# Patient Record
Sex: Male | Born: 1940
Health system: Southern US, Community
[De-identification: ages and names within clinical notes are randomized; demographics above are authoritative.]

## PROBLEM LIST (undated history)

## (undated) DIAGNOSIS — E785 Hyperlipidemia, unspecified: Secondary | ICD-10-CM

## (undated) DIAGNOSIS — M1990 Unspecified osteoarthritis, unspecified site: Secondary | ICD-10-CM

## (undated) DIAGNOSIS — I10 Essential (primary) hypertension: Secondary | ICD-10-CM

## (undated) DIAGNOSIS — N400 Enlarged prostate without lower urinary tract symptoms: Secondary | ICD-10-CM

## (undated) DIAGNOSIS — C4491 Basal cell carcinoma of skin, unspecified: Secondary | ICD-10-CM

## (undated) DIAGNOSIS — L570 Actinic keratosis: Secondary | ICD-10-CM

## (undated) HISTORY — PX: JOINT REPLACEMENT: SHX530

## (undated) HISTORY — PX: PROSTATE SURGERY: SHX751

## (undated) HISTORY — DX: Hyperlipidemia, unspecified: E78.5

## (undated) HISTORY — DX: Essential (primary) hypertension: I10

## (undated) HISTORY — DX: Basal cell carcinoma of skin, unspecified: C44.91

## (undated) HISTORY — DX: Benign prostatic hyperplasia without lower urinary tract symptoms: N40.0

## (undated) HISTORY — DX: Actinic keratosis: L57.0

---

## 2003-02-12 ENCOUNTER — Encounter: Admission: RE | Admit: 2003-02-12 | Discharge: 2003-02-12 | Payer: Self-pay | Admitting: Neurosurgery

## 2003-03-05 ENCOUNTER — Encounter: Admission: RE | Admit: 2003-03-05 | Discharge: 2003-03-05 | Payer: Self-pay | Admitting: Neurosurgery

## 2003-06-25 ENCOUNTER — Encounter: Admission: RE | Admit: 2003-06-25 | Discharge: 2003-06-25 | Payer: Self-pay | Admitting: Neurosurgery

## 2003-11-24 ENCOUNTER — Encounter: Admission: RE | Admit: 2003-11-24 | Discharge: 2003-11-24 | Payer: Self-pay | Admitting: Neurosurgery

## 2004-01-06 ENCOUNTER — Encounter: Admission: RE | Admit: 2004-01-06 | Discharge: 2004-01-06 | Payer: Self-pay | Admitting: Neurosurgery

## 2004-05-09 ENCOUNTER — Encounter: Admission: RE | Admit: 2004-05-09 | Discharge: 2004-05-09 | Payer: Self-pay | Admitting: Neurosurgery

## 2004-05-22 ENCOUNTER — Encounter: Admission: RE | Admit: 2004-05-22 | Discharge: 2004-05-22 | Payer: Self-pay | Admitting: Neurosurgery

## 2007-01-29 ENCOUNTER — Ambulatory Visit: Payer: Self-pay | Admitting: Gastroenterology

## 2013-04-25 ENCOUNTER — Emergency Department: Payer: Self-pay | Admitting: Internal Medicine

## 2013-04-25 LAB — COMPREHENSIVE METABOLIC PANEL
Albumin: 3.4 g/dL (ref 3.4–5.0)
Alkaline Phosphatase: 53 U/L
Anion Gap: 6 — ABNORMAL LOW (ref 7–16)
BUN: 24 mg/dL — ABNORMAL HIGH (ref 7–18)
Bilirubin,Total: 0.6 mg/dL (ref 0.2–1.0)
Calcium, Total: 8.6 mg/dL (ref 8.5–10.1)
Chloride: 106 mmol/L (ref 98–107)
Co2: 25 mmol/L (ref 21–32)
Creatinine: 0.99 mg/dL (ref 0.60–1.30)
EGFR (African American): >60
EGFR (Non-African Amer.): >60
Glucose: 106 mg/dL — ABNORMAL HIGH (ref 65–99)
Osmolality: 278 (ref 275–301)
Potassium: 4.5 mmol/L (ref 3.5–5.1)
SGOT(AST): 55 U/L — ABNORMAL HIGH (ref 15–37)
SGPT (ALT): 46 U/L (ref 12–78)
Sodium: 137 mmol/L (ref 136–145)
Total Protein: 7 g/dL (ref 6.4–8.2)

## 2013-04-25 LAB — URINALYSIS, COMPLETE
Bacteria: NONE SEEN
Bilirubin,UR: NEGATIVE
Blood: NEGATIVE
Glucose,UR: NEGATIVE mg/dL (ref 0–75)
Ketone: NEGATIVE
Leukocyte Esterase: NEGATIVE
Nitrite: NEGATIVE
Ph: 7 (ref 4.5–8.0)
Protein: NEGATIVE
RBC,UR: <1 /HPF (ref 0–5)
Specific Gravity: 1.02 (ref 1.003–1.030)
Squamous Epithelial: <1
WBC UR: 1 /HPF (ref 0–5)

## 2013-04-25 LAB — CBC WITH DIFFERENTIAL/PLATELET
Basophil #: 0.1 10*3/uL (ref 0.0–0.1)
Basophil %: 0.8 %
Eosinophil #: 0.2 10*3/uL (ref 0.0–0.7)
Eosinophil %: 3.2 %
HCT: 46.8 % (ref 40.0–52.0)
HGB: 15.1 g/dL (ref 13.0–18.0)
Lymphocyte #: 1.2 10*3/uL (ref 1.0–3.6)
Lymphocyte %: 19 %
MCH: 29.7 pg (ref 26.0–34.0)
MCHC: 32.2 g/dL (ref 32.0–36.0)
MCV: 92 fL (ref 80–100)
Monocyte #: 0.6 x10 3/mm (ref 0.2–1.0)
Monocyte %: 8.9 %
Neutrophil #: 4.4 10*3/uL (ref 1.4–6.5)
Neutrophil %: 68.1 %
Platelet: 157 10*3/uL (ref 150–440)
RBC: 5.07 10*6/uL (ref 4.40–5.90)
RDW: 14.1 % (ref 11.5–14.5)
WBC: 6.5 10*3/uL (ref 3.8–10.6)

## 2013-04-25 LAB — TROPONIN I: Troponin-I: <0.02 ng/mL

## 2013-04-25 LAB — LIPASE, BLOOD: Lipase: 258 U/L (ref 73–393)

## 2013-04-27 ENCOUNTER — Emergency Department: Payer: Self-pay | Admitting: Emergency Medicine

## 2013-05-27 DIAGNOSIS — M549 Dorsalgia, unspecified: Secondary | ICD-10-CM | POA: Insufficient documentation

## 2013-05-27 DIAGNOSIS — R109 Unspecified abdominal pain: Secondary | ICD-10-CM | POA: Insufficient documentation

## 2013-05-27 DIAGNOSIS — B029 Zoster without complications: Secondary | ICD-10-CM | POA: Insufficient documentation

## 2013-06-02 ENCOUNTER — Ambulatory Visit: Payer: Self-pay | Admitting: Pain Medicine

## 2013-06-10 ENCOUNTER — Ambulatory Visit: Payer: Self-pay | Admitting: Pain Medicine

## 2013-06-22 ENCOUNTER — Ambulatory Visit: Payer: Self-pay | Admitting: Pain Medicine

## 2013-06-30 ENCOUNTER — Ambulatory Visit: Payer: Self-pay | Admitting: Pain Medicine

## 2013-07-08 ENCOUNTER — Ambulatory Visit: Payer: Self-pay | Admitting: Pain Medicine

## 2013-07-23 ENCOUNTER — Ambulatory Visit: Payer: Self-pay | Admitting: Pain Medicine

## 2013-08-05 ENCOUNTER — Ambulatory Visit (INDEPENDENT_AMBULATORY_CARE_PROVIDER_SITE_OTHER): Payer: Medicare Other | Admitting: Podiatry

## 2013-08-05 ENCOUNTER — Encounter: Payer: Self-pay | Admitting: Podiatry

## 2013-08-05 ENCOUNTER — Ambulatory Visit (INDEPENDENT_AMBULATORY_CARE_PROVIDER_SITE_OTHER): Payer: Medicare Other

## 2013-08-05 VITALS — Resp 16 | Ht 69.0 in | Wt 159.0 lb

## 2013-08-05 DIAGNOSIS — M205X9 Other deformities of toe(s) (acquired), unspecified foot: Secondary | ICD-10-CM

## 2013-08-05 DIAGNOSIS — M205X2 Other deformities of toe(s) (acquired), left foot: Secondary | ICD-10-CM

## 2013-08-05 DIAGNOSIS — M21619 Bunion of unspecified foot: Secondary | ICD-10-CM

## 2013-08-05 DIAGNOSIS — M21612 Bunion of left foot: Secondary | ICD-10-CM

## 2013-08-05 DIAGNOSIS — L608 Other nail disorders: Secondary | ICD-10-CM

## 2013-08-05 NOTE — Progress Notes (Signed)
   Subjective:    Patient ID: Timothy Knapp, male    DOB: [birth year], 73 y.o.   MRN: [id number]  HPI Comments: im concerned with my big toe and my 2nd toe on my left foot. They do not hurt. My big toe has been going under the 2nd toe for about 6 months. The pad under the toes hurt. Standing and walking will bother my foot slightly. i dont do anything for my foot.  Foot Pain      Review of Systems  Genitourinary: Positive for frequency.  Musculoskeletal:       Back pain  Skin:       Open sores Change in nails  All other systems reviewed and are negative.      Objective:   Physical Exam: I have reviewed his past medical history medications allergies surgeries social history and review of systems. Pulses are strongly palpable bilateral. Neurologic sensorium is intact per Semmes-Weinstein monofilament. Deep tendon reflexes are intact bilateral muscle strength is 5 over 5 dorsiflexors plantar flexors inverters everters all intrinsic musculature is intact. Orthopedic evaluation demonstrates all joints distal to the ankle a full range of motion with the exception of the first metatarsophalangeal joint of the left foot. There is limitation on dorsiflexion and demonstrates an obvious hallux abductovalgus deformity. The second digit of the left foot is been rubbed by the hallux left. Radiographic evaluation demonstrates dorsal spurring the head of the first metatarsal left. And joint space narrowing severe in nature resulting in hallux rigidus. Cutaneous evaluation demonstrates supple well hydrated cutis with exception of tinea pedis to the plantar aspect of the bilateral foot nails appear to be thick yellow dystrophic possibly mycotic we'll need to rule out.        Assessment & Plan:  Assessment: Hallux limitus with osteoarthritis and bunion deformity first metatarsophalangeal joint left foot. Irritation of the second toe due to the abnormality of the first. Onychodystrophy hallux bilaterally  with tinea pedis.  Plan: Discussed appropriate shoe gear stretching exercises ice therapy and shoe gear modifications area discussed surgical intervention with silicone implant. Took samples of his hallux nails today and sent for pathology evaluation. Discussed padding and dispensed padding to go between the toes. I will followup with him with his pathology returns.

## 2013-08-19 ENCOUNTER — Encounter: Payer: Self-pay | Admitting: Podiatry

## 2013-08-24 ENCOUNTER — Ambulatory Visit (INDEPENDENT_AMBULATORY_CARE_PROVIDER_SITE_OTHER): Payer: Medicare Other | Admitting: Podiatry

## 2013-08-24 VITALS — BP 146/75 | HR 68 | Resp 16

## 2013-08-24 DIAGNOSIS — Z79899 Other long term (current) drug therapy: Secondary | ICD-10-CM

## 2013-08-24 LAB — CBC WITH DIFFERENTIAL/PLATELET
Basophils Absolute: 0 10*3/uL (ref 0.0–0.2)
Basos: 1 %
Eos: 6 %
Eosinophils Absolute: 0.3 10*3/uL (ref 0.0–0.4)
HCT: 41.4 % (ref 37.5–51.0)
Hemoglobin: 14.5 g/dL (ref 12.6–17.7)
Immature Grans (Abs): 0 10*3/uL (ref 0.0–0.1)
Immature Granulocytes: 0 %
Lymphocytes Absolute: 1.2 10*3/uL (ref 0.7–3.1)
Lymphs: 20 %
MCH: 31.5 pg (ref 26.6–33.0)
MCHC: 35 g/dL (ref 31.5–35.7)
MCV: 90 fL (ref 79–97)
Monocytes Absolute: 0.6 10*3/uL (ref 0.1–0.9)
Monocytes: 10 %
Neutrophils Absolute: 3.8 10*3/uL (ref 1.4–7.0)
Neutrophils Relative %: 63 %
RBC: 4.6 x10E6/uL (ref 4.14–5.80)
RDW: 14.3 % (ref 12.3–15.4)
WBC: 5.8 10*3/uL (ref 3.4–10.8)

## 2013-08-24 MED ORDER — TERBINAFINE HCL 250 MG PO TABS: 250.0000 mg | ORAL_TABLET | Freq: Every day | ORAL | Status: DC

## 2013-08-24 NOTE — Progress Notes (Signed)
He presents today for his fungal results.  Objective: Onychomycosis.  Assessment: Onychomycosis.  Plan: Started him on Lamisil 250 mg tablets 1 by mouth daily x30 a blood work will also be performed. I will followup with him in one month for 90 day supply of medication and another liver profile.

## 2013-08-25 ENCOUNTER — Ambulatory Visit: Payer: Self-pay | Admitting: Pain Medicine

## 2013-08-25 LAB — HEPATIC FUNCTION PANEL
ALT: 53 IU/L — ABNORMAL HIGH (ref 0–44)
AST: 54 IU/L — ABNORMAL HIGH (ref 0–40)
Albumin: 3.9 g/dL (ref 3.5–4.8)
Alkaline Phosphatase: 60 IU/L (ref 39–117)
Bilirubin, Direct: 0.08 mg/dL (ref 0.00–0.40)
Total Bilirubin: 0.3 mg/dL (ref 0.0–1.2)
Total Protein: 5.9 g/dL — ABNORMAL LOW (ref 6.0–8.5)

## 2013-08-26 ENCOUNTER — Telehealth: Payer: Self-pay | Admitting: *Deleted

## 2013-08-26 NOTE — Telephone Encounter (Signed)
Spoke to patient regarding blood work , patient understood , pt will be seen in one month

## 2013-09-21 ENCOUNTER — Ambulatory Visit: Payer: Medicare Other | Admitting: Podiatry

## 2013-10-15 ENCOUNTER — Ambulatory Visit: Payer: Self-pay | Admitting: Pain Medicine

## 2013-10-26 ENCOUNTER — Ambulatory Visit: Payer: Self-pay | Admitting: Pain Medicine

## 2013-11-12 ENCOUNTER — Ambulatory Visit: Payer: Self-pay | Admitting: Pain Medicine

## 2013-12-15 ENCOUNTER — Ambulatory Visit: Payer: Self-pay | Admitting: Pain Medicine

## 2013-12-23 ENCOUNTER — Ambulatory Visit: Payer: Self-pay | Admitting: Pain Medicine

## 2014-01-12 ENCOUNTER — Ambulatory Visit: Payer: Self-pay | Admitting: Pain Medicine

## 2014-01-12 DIAGNOSIS — F1120 Opioid dependence, uncomplicated: Secondary | ICD-10-CM | POA: Diagnosis not present

## 2014-01-12 DIAGNOSIS — G894 Chronic pain syndrome: Secondary | ICD-10-CM | POA: Diagnosis not present

## 2014-01-12 DIAGNOSIS — M791 Myalgia: Secondary | ICD-10-CM | POA: Diagnosis not present

## 2014-01-12 DIAGNOSIS — Z79899 Other long term (current) drug therapy: Secondary | ICD-10-CM | POA: Diagnosis not present

## 2014-01-12 DIAGNOSIS — R0782 Intercostal pain: Secondary | ICD-10-CM | POA: Diagnosis not present

## 2014-01-12 DIAGNOSIS — Z87898 Personal history of other specified conditions: Secondary | ICD-10-CM | POA: Diagnosis not present

## 2014-01-12 DIAGNOSIS — M546 Pain in thoracic spine: Secondary | ICD-10-CM | POA: Diagnosis not present

## 2014-01-12 DIAGNOSIS — Z79891 Long term (current) use of opiate analgesic: Secondary | ICD-10-CM | POA: Diagnosis not present

## 2014-01-12 DIAGNOSIS — B0223 Postherpetic polyneuropathy: Secondary | ICD-10-CM | POA: Diagnosis not present

## 2014-01-20 ENCOUNTER — Ambulatory Visit: Payer: Self-pay | Admitting: Pain Medicine

## 2014-01-20 DIAGNOSIS — M546 Pain in thoracic spine: Secondary | ICD-10-CM | POA: Diagnosis not present

## 2014-01-20 DIAGNOSIS — R0782 Intercostal pain: Secondary | ICD-10-CM | POA: Diagnosis not present

## 2014-02-09 ENCOUNTER — Ambulatory Visit: Payer: Self-pay | Admitting: Pain Medicine

## 2014-02-09 DIAGNOSIS — M791 Myalgia: Secondary | ICD-10-CM | POA: Diagnosis not present

## 2014-02-09 DIAGNOSIS — R0782 Intercostal pain: Secondary | ICD-10-CM | POA: Diagnosis not present

## 2014-02-09 DIAGNOSIS — B0223 Postherpetic polyneuropathy: Secondary | ICD-10-CM | POA: Diagnosis not present

## 2014-02-09 DIAGNOSIS — M545 Low back pain: Secondary | ICD-10-CM | POA: Diagnosis not present

## 2014-02-09 DIAGNOSIS — E780 Pure hypercholesterolemia: Secondary | ICD-10-CM | POA: Diagnosis not present

## 2014-02-09 DIAGNOSIS — I10 Essential (primary) hypertension: Secondary | ICD-10-CM | POA: Diagnosis not present

## 2014-03-01 ENCOUNTER — Ambulatory Visit: Payer: Self-pay | Admitting: Pain Medicine

## 2014-03-01 DIAGNOSIS — R0782 Intercostal pain: Secondary | ICD-10-CM | POA: Diagnosis not present

## 2014-03-01 DIAGNOSIS — M546 Pain in thoracic spine: Secondary | ICD-10-CM | POA: Diagnosis not present

## 2014-03-25 DIAGNOSIS — B0229 Other postherpetic nervous system involvement: Secondary | ICD-10-CM | POA: Diagnosis not present

## 2014-03-25 DIAGNOSIS — I10 Essential (primary) hypertension: Secondary | ICD-10-CM | POA: Diagnosis not present

## 2014-03-25 DIAGNOSIS — E780 Pure hypercholesterolemia: Secondary | ICD-10-CM | POA: Diagnosis not present

## 2014-04-06 ENCOUNTER — Ambulatory Visit: Payer: Self-pay | Admitting: Pain Medicine

## 2014-04-06 DIAGNOSIS — B0223 Postherpetic polyneuropathy: Secondary | ICD-10-CM | POA: Diagnosis not present

## 2014-04-06 DIAGNOSIS — M5416 Radiculopathy, lumbar region: Secondary | ICD-10-CM | POA: Diagnosis not present

## 2014-04-06 DIAGNOSIS — M546 Pain in thoracic spine: Secondary | ICD-10-CM | POA: Diagnosis not present

## 2014-04-06 DIAGNOSIS — R0782 Intercostal pain: Secondary | ICD-10-CM | POA: Diagnosis not present

## 2014-04-06 DIAGNOSIS — M47817 Spondylosis without myelopathy or radiculopathy, lumbosacral region: Secondary | ICD-10-CM | POA: Diagnosis not present

## 2014-05-06 ENCOUNTER — Ambulatory Visit
Admit: 2014-05-06 | Disposition: A | Discharge status: Home / Self Care | Payer: Self-pay | Attending: Pain Medicine | Admitting: Pain Medicine

## 2014-05-06 DIAGNOSIS — M47817 Spondylosis without myelopathy or radiculopathy, lumbosacral region: Secondary | ICD-10-CM | POA: Diagnosis not present

## 2014-05-06 DIAGNOSIS — G8929 Other chronic pain: Secondary | ICD-10-CM | POA: Diagnosis not present

## 2014-05-06 DIAGNOSIS — F1120 Opioid dependence, uncomplicated: Secondary | ICD-10-CM | POA: Diagnosis not present

## 2014-05-06 DIAGNOSIS — R0782 Intercostal pain: Secondary | ICD-10-CM | POA: Diagnosis not present

## 2014-05-06 DIAGNOSIS — Z79899 Other long term (current) drug therapy: Secondary | ICD-10-CM | POA: Diagnosis not present

## 2014-05-06 DIAGNOSIS — Z5181 Encounter for therapeutic drug level monitoring: Secondary | ICD-10-CM | POA: Diagnosis not present

## 2014-05-06 DIAGNOSIS — B0223 Postherpetic polyneuropathy: Secondary | ICD-10-CM | POA: Diagnosis not present

## 2014-05-06 DIAGNOSIS — M545 Low back pain: Secondary | ICD-10-CM | POA: Diagnosis not present

## 2014-05-06 DIAGNOSIS — Z0389 Encounter for observation for other suspected diseases and conditions ruled out: Secondary | ICD-10-CM | POA: Diagnosis not present

## 2014-05-06 DIAGNOSIS — M5416 Radiculopathy, lumbar region: Secondary | ICD-10-CM | POA: Diagnosis not present

## 2014-05-06 DIAGNOSIS — E780 Pure hypercholesterolemia: Secondary | ICD-10-CM | POA: Diagnosis not present

## 2014-05-06 DIAGNOSIS — I10 Essential (primary) hypertension: Secondary | ICD-10-CM | POA: Diagnosis not present

## 2014-06-03 ENCOUNTER — Encounter (INDEPENDENT_AMBULATORY_CARE_PROVIDER_SITE_OTHER): Payer: Self-pay

## 2014-06-03 ENCOUNTER — Encounter: Payer: Self-pay | Admitting: Pain Medicine

## 2014-06-03 ENCOUNTER — Ambulatory Visit: Payer: Medicare Other | Attending: Pain Medicine | Admitting: Pain Medicine

## 2014-06-03 VITALS — BP 115/59 | HR 58 | Temp 97.9°F | Resp 18 | Ht 69.0 in | Wt 155.0 lb

## 2014-06-03 DIAGNOSIS — M5134 Other intervertebral disc degeneration, thoracic region: Secondary | ICD-10-CM | POA: Insufficient documentation

## 2014-06-03 DIAGNOSIS — B0229 Other postherpetic nervous system involvement: Secondary | ICD-10-CM

## 2014-06-03 DIAGNOSIS — R0782 Intercostal pain: Secondary | ICD-10-CM | POA: Diagnosis not present

## 2014-06-03 DIAGNOSIS — M5416 Radiculopathy, lumbar region: Secondary | ICD-10-CM | POA: Diagnosis not present

## 2014-06-03 DIAGNOSIS — M47817 Spondylosis without myelopathy or radiculopathy, lumbosacral region: Secondary | ICD-10-CM | POA: Diagnosis not present

## 2014-06-03 DIAGNOSIS — B0223 Postherpetic polyneuropathy: Secondary | ICD-10-CM | POA: Diagnosis not present

## 2014-06-03 DIAGNOSIS — M546 Pain in thoracic spine: Secondary | ICD-10-CM | POA: Diagnosis not present

## 2014-06-03 MED ORDER — DULOXETINE HCL 30 MG PO CPEP: ORAL_CAPSULE | ORAL | Status: DC

## 2014-06-03 MED ORDER — OXYCODONE HCL 5 MG PO TABS: ORAL_TABLET | ORAL | Status: DC

## 2014-06-03 MED ORDER — GABAPENTIN 800 MG PO TABS: ORAL_TABLET | ORAL | Status: DC

## 2014-06-03 NOTE — Progress Notes (Signed)
   Subjective:    Patient ID: Timothy Knapp, male    DOB: 01/21/1940, 74 y.o.   MRN: [id number]  HPI   patient is 74 year old gentleman who [facility] and treatment of right thoracic pain felt to be due to postherpetic neuralgia. Patient states he has had excellent relief of pain with treatment and [facility] states that he knows some return of pain at this time. Discuss patient's medications and treatment and we'll modify the treatment regimen by increasing the Neurontin at this time provided patient is without drowsiness confusion or other undesirable side effects. We will also schedule patient for intercostal nerve block to be performed at time return appointment. All understanding and in agreement with suggested treatment plan  Review of Systems     Objective:   Physical Exam   was tenderness of the splenius capitis and occipitalis region of mild degree. There was unremarkable Spurling's maneuver. Patient appeared to be with bilaterally equal grip strength. Tinel and Phalen's maneuver without increased pain of any significant degree. Palpation of the cervical and thoracic facet and cervical and thoracic paraspinal musculature region was returns tends to palpation of mild degree in the cervical region with moderately severe tends to palpation of the right thoracic paraspinal musculature region in any area of approximately dermatomal distribution of T4  To  T8 . Patient of the lumbar paraspinal muscles lumbar facet reproducing minimal discomfort. Raising tolerated to 30 without increased pain dorsiflexion negative clonus negative Homans. The  Abdomen was with tenderness to palpation dermatomal distribution approximates T 4  To  T8  with no costovertebral tenderness noted      Assessment & Plan:     postherpetic neuralgia of the right thoracic region    Plan   Continue present medications. Neurontin Cymbalta vitamin B  And  oxycodone  F/U PCP for evaliation of  BP and general medical  Condition.   intercostal nerve block to be performed at time of return appointment  F/U surgical evaluation.  F/U neurological evaluation.  May consider radiofrequency rhizolysis or intraspinal procedures pending response to present treatment and F/U evaluation.  Patient to call [facility] should patient have concerns prior to scheduled return appointment.

## 2014-06-03 NOTE — Patient Instructions (Addendum)
Continue present medications. As discussed you may take one Neurontin at bedtime and one half of a Neurontin during the day only if tolerated. Continue Cymbalta and vitamin B as presently doing and titrate her oxycodone as we discussed  Intercostal nerve blocks on 06/21/2014  F/U PCP for evaliation of  BP and general medical  condition.  F/U surgical evaluation.  F/U neurological evaluation.  May consider radiofrequency rhizolysis or intraspinal procedures pending response to present treatment and F/U evaluation.  Patient to call [facility] should patient have concerns prior to scheduled return appointment. GENERAL RISKS AND COMPLICATIONS  What are the risk, side effects and possible complications? Generally speaking, most procedures are safe.  However, with any procedure there are risks, side effects, and the possibility of complications.  The risks and complications are dependent upon the sites that are lesioned, or the type of nerve block to be performed.  The closer the procedure is to the spine, the more serious the risks are.  Great care is taken when placing the radio frequency needles, block needles or lesioning probes, but sometimes complications can occur. 1. Infection: Any time there is an injection through the skin, there is a risk of infection.  This is why sterile conditions are used for these blocks.  There are four possible types of infection. 1. Localized skin infection. 2. Central Nervous System Infection-This can be in the form of Meningitis, which can be deadly. 3. Epidural Infections-This can be in the form of an epidural abscess, which can cause pressure inside of the spine, causing compression of the spinal cord with subsequent paralysis. This would require an emergency surgery to decompress, and there are no guarantees that the patient would recover from the paralysis. 4. Discitis-This is an infection of the intervertebral discs.  It occurs in about 1% of  discography procedures.  It is difficult to treat and it may lead to surgery.        2. Pain: the needles have to go through skin and soft tissues, will cause soreness.       3. Damage to internal structures:  The nerves to be lesioned may be near blood vessels or    other nerves which can be potentially damaged.       4. Bleeding: Bleeding is more common if the patient is taking blood thinners such as  aspirin, Coumadin, Ticiid, Plavix, etc., or if he/she have some genetic predisposition  such as hemophilia. Bleeding into the spinal canal can cause compression of the spinal  cord with subsequent paralysis.  This would require an emergency surgery to  decompress and there are no guarantees that the patient would recover from the  paralysis.       5. Pneumothorax:  Puncturing of a lung is a possibility, every time a needle is introduced in  the area of the chest or upper back.  Pneumothorax refers to free air around the  collapsed lung(s), inside of the thoracic cavity (chest cavity).  Another two possible  complications related to a similar event would include: Hemothorax and Chylothorax.   These are variations of the Pneumothorax, where instead of air around the collapsed  lung(s), you may have blood or chyle, respectively.       6. Spinal headaches: They may occur with any procedures in the area of the spine.       7. Persistent CSF (Cerebro-Spinal Fluid) leakage: This is a rare problem, but may occur  with prolonged intrathecal or epidural catheters either  due to the formation of a fistulous  track or a dural tear.       8. Nerve damage: By working so close to the spinal cord, there is always a possibility of  nerve damage, which could be as serious as a permanent spinal cord injury with  paralysis.       9. Death:  Although rare, severe deadly allergic reactions known as "Anaphylactic  reaction" can occur to any of the medications used.      10. Worsening of the symptoms:  We can always make thing  worse.  What are the chances of something like this happening? Chances of any of this occuring are extremely low.  By statistics, you have more of a chance of getting killed in a motor vehicle accident: while driving to the hospital than any of the above occurring .  Nevertheless, you should be aware that they are possibilities.  In general, it is similar to taking a shower.  Everybody knows that you can slip, hit your head and get killed.  Does that mean that you should not shower again?  Nevertheless always keep in mind that statistics do not mean anything if you happen to be on the wrong side of them.  Even if a procedure has a 1 (one) in a 1,000,000 (million) chance of going wrong, it you happen to be that one..Also, keep in mind that by statistics, you have more of a chance of having something go wrong when taking medications.  Who should not have this procedure? If you are on a blood thinning medication (e.g. Coumadin, Plavix, see list of "Blood Thinners"), or if you have an active infection going on, you should not have the procedure.  If you are taking any blood thinners, please inform your physician.  How should I prepare for this procedure?  Do not eat or drink anything at least six hours prior to the procedure.  Bring a driver with you .  It cannot be a taxi.  Come accompanied by an adult that can drive you back, and that is strong enough to help you if your legs get weak or numb from the local anesthetic.  Take all of your medicines the morning of the procedure with just enough water to swallow them.  If you have diabetes, make sure that you are scheduled to have your procedure done first thing in the morning, whenever possible.  If you have diabetes, take only half of your insulin dose and notify our nurse that you have done so as soon as you arrive at the clinic.  If you are diabetic, but only take blood sugar pills (oral hypoglycemic), then do not take them on the morning of your  procedure.  You may take them after you have had the procedure.  Do not take aspirin or any aspirin-containing medications, at least eleven (11) days prior to the procedure.  They may prolong bleeding.  Wear loose fitting clothing that may be easy to take off and that you would not mind if it got stained with Betadine or blood.  Do not wear any jewelry or perfume  Remove any nail coloring.  It will interfere with some of our monitoring equipment.  NOTE: Remember that this is not meant to be interpreted as a complete list of all possible complications.  Unforeseen problems may occur.  BLOOD THINNERS The following drugs contain aspirin or other products, which can cause increased bleeding during surgery and should not be taken for 2  weeks prior to and 1 week after surgery.  If you should need take something for relief of minor pain, you may take acetaminophen which is found in Tylenol,m Datril, Anacin-3 and Panadol. It is not blood thinner. The products listed below are.  Do not take any of the products listed below in addition to any listed on your instruction sheet.  A.P.C or A.P.C with Codeine Codeine Phosphate Capsules #3 Ibuprofen Ridaura  ABC compound Congesprin Imuran rimadil  Advil Cope Indocin Robaxisal  Alka-Seltzer Effervescent Pain Reliever and Antacid Coricidin or Coricidin-D  Indomethacin Rufen  Alka-Seltzer plus Cold Medicine Cosprin Ketoprofen S-A-C Tablets  Anacin Analgesic Tablets or Capsules Coumadin Korlgesic Salflex  Anacin Extra Strength Analgesic tablets or capsules CP-2 Tablets Lanoril Salicylate  Anaprox Cuprimine Capsules Levenox Salocol  Anexsia-D Dalteparin Magan Salsalate  Anodynos Darvon compound Magnesium Salicylate Sine-off  Ansaid Dasin Capsules Magsal Sodium Salicylate  Anturane Depen Capsules Marnal Soma  APF Arthritis pain formula Dewitt's Pills Measurin Stanback  Argesic Dia-Gesic Meclofenamic Sulfinpyrazone  Arthritis Bayer Timed Release Aspirin  Diclofenac Meclomen Sulindac  Arthritis pain formula Anacin Dicumarol Medipren Supac  Analgesic (Safety coated) Arthralgen Diffunasal Mefanamic Suprofen  Arthritis Strength Bufferin Dihydrocodeine Mepro Compound Suprol  Arthropan liquid Dopirydamole Methcarbomol with Aspirin Synalgos  ASA tablets/Enseals Disalcid Micrainin Tagament  Ascriptin Doan's Midol Talwin  Ascriptin A/D Dolene Mobidin Tanderil  Ascriptin Extra Strength Dolobid Moblgesic Ticlid  Ascriptin with Codeine Doloprin or Doloprin with Codeine Momentum Tolectin  Asperbuf Duoprin Mono-gesic Trendar  Aspergum Duradyne Motrin or Motrin IB Triminicin  Aspirin plain, buffered or enteric coated Durasal Myochrisine Trigesic  Aspirin Suppositories Easprin Nalfon Trillsate  Aspirin with Codeine Ecotrin Regular or Extra Strength Naprosyn Uracel  Atromid-S Efficin Naproxen Ursinus  Auranofin Capsules Elmiron Neocylate Vanquish  Axotal Emagrin Norgesic Verin  Azathioprine Empirin or Empirin with Codeine Normiflo Vitamin E  Azolid Emprazil Nuprin Voltaren  Bayer Aspirin plain, buffered or children's or timed BC Tablets or powders Encaprin Orgaran Warfarin Sodium  Buff-a-Comp Enoxaparin Orudis Zorpin  Buff-a-Comp with Codeine Equegesic Os-Cal-Gesic   Buffaprin Excedrin plain, buffered or Extra Strength Oxalid   Bufferin Arthritis Strength Feldene Oxphenbutazone   Bufferin plain or Extra Strength Feldene Capsules Oxycodone with Aspirin   Bufferin with Codeine Fenoprofen Fenoprofen Pabalate or Pabalate-SF   Buffets II Flogesic Panagesic   Buffinol plain or Extra Strength Florinal or Florinal with Codeine Panwarfarin   Buf-Tabs Flurbiprofen Penicillamine   Butalbital Compound Four-way cold tablets Penicillin   Butazolidin Fragmin Pepto-Bismol   Carbenicillin Geminisyn Percodan   Carna Arthritis Reliever Geopen Persantine   Carprofen Gold's salt Persistin   Chloramphenicol Goody's Phenylbutazone   Chloromycetin Haltrain Piroxlcam    Clmetidine heparin Plaquenil   Cllnoril Hyco-pap Ponstel   Clofibrate Hydroxy chloroquine Propoxyphen         Before stopping any of these medications, be sure to consult the physician who ordered them.  Some, such as Coumadin (Warfarin) are ordered to prevent or treat serious conditions such as "deep thrombosis", "pumonary embolisms", and other heart problems.  The amount of time that you may need off of the medication may also vary with the medication and the reason for which you were taking it.  If you are taking any of these medications, please make sure you notify your pain physician before you undergo any procedures.         Intercostal Nerve Block Patient Information  Description: The twelve intercostal nerves arise from the first thru twelfth thoracic nerve roots.  The nerve begins at  the spine and wraps around the body, lying in a groove underneath each rib.  Each intercostal nerve innervates a specific strip of skin and body walk of the abdomen and chest.  Therefore, injuries of the chest wall or abdominal wall result in pain that is transmitted back to the brian via the intercostal nerves.  Examples of such injuries include rib fractures and incisions for lung and gall bladder surgery.  Occasionally, pain may persist long after an injury or surgical incision secondary to inflammation and irritation of the intercostal nerve.  The longstanding pain is known as intercostal neuralgia.  An intercostal nerve block is preformed to eliminate pain either temporarily or permanently.  A small needle is placed below the rib and local anesthetic (like Novocaine) and possibly steroid is injected.  Usually 2-4 intercostal nerves are blocked at a time depending on the problem.  The patient will experience a slight "pin-prick" sensation for each injection.  Shortly thereafter, the strip of skin that is innervated by the blocked intercostal nerve will feel numb.  Persistent pain that is only temporarily  relieved with local anesthetic may require a more permanent block. This procedure is called Cryoneurolysis and entails placing a small probe beneath the rib to freeze the nerve.  Conditions that may be treated by intercostal nerve blocks:   Rib fractures  Longstanding pain from surgery of the chest or abdomen (intercostal neuralgia)  Pain from chest tubes  Pain from trauma to the chest  Preparation for the injections:  1. Do not eat any solid food or dairy products within 6 hours of your appointment. 2. You may drink clear liquids up to 2 hours before appointment.  Clear liquids include water, black coffee, juice or soda.  No milk or cream please. 3. You may take your regular medication, including pain medications, with a sip of water before your appointment.  Diabetics should hold regular insulin (if take separately) and take 1/2 normal NPH dose the morning of the procedure.   Carry some sugar containing items with you to your appointment. 4. A driver must accompany you and be prepared to drive you home after your procedure. 5. Bring all your current medications with you. 6. An IV may be inserted and sedation may be given at the discretion of the physician. 7. A blood pressure cuff, EKG and other monitors will often be applied during the procedure.  Some patients may need to have extra oxygen administered for a short period. 8. You will be asked to provide medical information, including your allergies, prior to the procedure.  We must know immediately if you are taking blood thinners (like Coumadin/Warfarin) or if you are allergic to IV iodine contrast (dye). We must know if you could possible be pregnant.  Possible side-effects:   Bleeding from needle site  Infection (rare)  Nerve injury (rare)  Numbness & tingling of skin  Collapsed lung requiring chest tube (rare)  Local anesthetic toxicity (rare)  Light-headedness (temporary)  Pain at injection site (several  days)  Decreased blood pressure (temporary)  Shortness of breath  Jittery/shaking sensation (temporary)  Call if you experience:   Difficulty breathing or hives (go directly to the emergency room)  Redness, inflammation or drainage at the injection site  Severe pain at the site of the injection  Any new symptoms which are concerning   Please note:  Your pain may subside immediately but may return several hours after the injection.  Often, more than one injection is required to reduce  the pain. Also, if several temporary blocks with local anesthetic are ineffective, a more permanent block with cryolysis may be necessary.  This will be discussed with you should this be the case.  If you have any questions, please call [phone] [facility]  Continue present medications.  Neurontin may be increased as directed with caution to avoid drowsiness confusion and other undesirable side effects   Intercostal nerve blocks to be performed at time return appointment  F/U PCP for evaliation of  BP and general medical  condition.  F/U surgical evaluation.  F/U neurological evaluation.  May consider radiofrequency rhizolysis or intraspinal procedures pending response to present treatment and F/U evaluation.  [facility] to call [facility] should patient have concerns prior to scheduled return appointment.

## 2014-06-03 NOTE — Progress Notes (Signed)
Discharged to home ambulatory at 1311 with oxycodone script.  Pre procedure instructions given with teach back 3 done.

## 2014-06-03 NOTE — Progress Notes (Signed)
Safety precautions to be maintained throughout the outpatient stay will include: orient to surroundings, keep bed in low position, maintain call bell within reach at all times, provide assistance with transfer out of bed and ambulation.  

## 2014-06-21 ENCOUNTER — Ambulatory Visit: Payer: Medicare Other | Attending: Pain Medicine | Admitting: Pain Medicine

## 2014-06-21 VITALS — BP 114/58 | HR 45 | Temp 97.1°F | Resp 14 | Ht 69.0 in | Wt 155.0 lb

## 2014-06-21 DIAGNOSIS — M5134 Other intervertebral disc degeneration, thoracic region: Secondary | ICD-10-CM

## 2014-06-21 DIAGNOSIS — M546 Pain in thoracic spine: Secondary | ICD-10-CM | POA: Diagnosis not present

## 2014-06-21 DIAGNOSIS — R0782 Intercostal pain: Secondary | ICD-10-CM | POA: Diagnosis not present

## 2014-06-21 DIAGNOSIS — B0229 Other postherpetic nervous system involvement: Secondary | ICD-10-CM

## 2014-06-21 MED ORDER — MIDAZOLAM HCL 5 MG/5ML IJ SOLN
INTRAMUSCULAR | Status: AC
  Administered 2014-06-21: 3 mg via INTRAVENOUS
  Filled 2014-06-21: qty 5

## 2014-06-21 MED ORDER — BUPIVACAINE HCL (PF) 0.25 % IJ SOLN
INTRAMUSCULAR | Status: AC
  Administered 2014-06-21: 30 mL
  Filled 2014-06-21: qty 30

## 2014-06-21 MED ORDER — TRIAMCINOLONE ACETONIDE 40 MG/ML IJ SUSP
INTRAMUSCULAR | Status: AC
  Administered 2014-06-21: 40 mg
  Filled 2014-06-21: qty 1

## 2014-06-21 MED ORDER — FENTANYL CITRATE (PF) 100 MCG/2ML IJ SOLN
INTRAMUSCULAR | Status: AC
  Administered 2014-06-21: 50 ug via INTRAVENOUS
  Filled 2014-06-21: qty 2

## 2014-06-21 NOTE — Progress Notes (Signed)
   Subjective:    Patient ID: Timothy Knapp, male    DOB: July 07, 1940, 74 y.o.   MRN: [id number]  HPI   PROCEDURE PERFORMED:  Intercostal nerve block. RIGHT SIDE  HISTORY OF PRESENT ILLNESS:  The patient is a 73 y.o. male who returns to the [facility] for further evaluation and treatment of pain involving the midportion of the back. There is concern regarding the patient's pain being due to significant component of intercostal neuralgia. The risks, benefits, and expectations of the procedure have been discussed and explained to the patient who was understanding and in agreement with suggested treatment plan. We will proceed with interventional treatment as discussed.   DESCRIPTION OF PROCEDURE: Intercostal nerve block with IV Versed, IV fentanyl conscious sedation, EKG, blood pressure, pulse, and pulse oximetry monitoring. The procedure was performed with the patient in the prone position under fluoroscopic guidance.   Intercostal nerve block,  right side: With the patient in the prone position, Betadine prep of proposed entry site was performed under fluoroscopic guidance with AP view of the thoracic spine. Under fluoroscopic guidance, a 22 -gauge needle was inserted to contact bone of the   12th rib on the  right side after which the needle was repositioned at the inferior border of the  12 rib on the  right side under fluoroscopic guidance. Following documentation of needle placement, at the inferior border of the  12 rib on the  right side and negative aspiration, a total of 3 mL of 0.25% bupivacaine with Kenalog was injected for  right (side) for  12 rib intercostal nerve block.   INTERCOSTAL NERVE BLOCKS AT  T 11, T 10 , T 9 , T 8 , and T7   LEVELS: The procedure was performed at  T11, T10, T9, and T8, and T7 levels as was performed at the previous level.T12  utilizing the same technique and under fluoroscopic guidance as was performed at the T12 intercostal nerve block  level  A  total of 10 mg Kenalog was utilized for the procedure.   PLAN:   1. Medications: We will continue presently prescribed medications. 2. The patient is to follow up with primary care physician for further evaluation of blood pressure and general medical condition as discussed. 3. Surgical evaluation.  4. Neurological evaluation. 5. May consider the patient for additional studies pending response to treatment and follow-up evaluation. 6. May consider radiofrequency procedures, implantation type procedures and other treatment pending response to treatment and follow-up evaluation. 7. The patient has been advised to adhere to proper body mechanics and to call the [facility] prior to scheduled return appointment should there be significant change in condition or have other concerns regarding condition prior to scheduled return appointment.  The patient is understanding and in agreement with suggested treatment plan.    Review of Systems     Objective:   Physical Exam        Assessment & Plan:

## 2014-06-21 NOTE — Progress Notes (Signed)
Safety precautions to be maintained throughout the outpatient stay will include: orient to surroundings, keep bed in low position, maintain call bell within reach at all times, provide assistance with transfer out of bed and ambulation.  

## 2014-06-21 NOTE — Patient Instructions (Addendum)
Continue present medications.  F/U PCP for evaliation of  BP and general medical  condition.  F/U surgical evaluation.  F/U neurological evaluation.  May consider radiofrequency rhizolysis or intraspinal procedures pending response to present treatment and F/U evaluation.  Patient to call [facility] should patient have concerns prior to scheduled return appointment. Pain Management Discharge Instructions  General Discharge Instructions :  If you need to reach your doctor call: Monday-Friday 8:00 am - 4:00 pm at [phone] or toll free [phone].  After clinic hours [phone] to have operator reach doctor.  Bring all of your medication bottles to all your appointments in the pain clinic.  To cancel or reschedule your appointment with Pain Management please remember to call 24 hours in advance to avoid a fee.  Refer to the educational materials which you have been given on: General Risks, I had my Procedure. Discharge Instructions, Post Sedation.  Post Procedure Instructions:  The drugs you were given will stay in your system until tomorrow, so for the next 24 hours you should not drive, make any legal decisions or drink any alcoholic beverages.  You may eat anything you prefer, but it is better to start with liquids then soups and crackers, and gradually work up to solid foods.  Please notify your doctor immediately if you have any unusual bleeding, trouble breathing or pain that is not related to your normal pain.  Depending on the type of procedure that was done, some parts of your body may feel week and/or numb.  This usually clears up by tonight or the next day.  Walk with the use of an assistive device or accompanied by an adult for the 24 hours.  You may use ice on the affected area for the first 24 hours.  Put ice in a Ziploc bag and cover with a towel and place against area 15 minutes on 15 minutes off.  You may switch to heat after 24 hours.GENERAL  RISKS AND COMPLICATIONS  What are the risk, side effects and possible complications? Generally speaking, most procedures are safe.  However, with any procedure there are risks, side effects, and the possibility of complications.  The risks and complications are dependent upon the sites that are lesioned, or the type of nerve block to be performed.  The closer the procedure is to the spine, the more serious the risks are.  Great care is taken when placing the radio frequency needles, block needles or lesioning probes, but sometimes complications can occur. 1. Infection: Any time there is an injection through the skin, there is a risk of infection.  This is why sterile conditions are used for these blocks.  There are four possible types of infection. 1. Localized skin infection. 2. Central Nervous System Infection-This can be in the form of Meningitis, which can be deadly. 3. Epidural Infections-This can be in the form of an epidural abscess, which can cause pressure inside of the spine, causing compression of the spinal cord with subsequent paralysis. This would require an emergency surgery to decompress, and there are no guarantees that the patient would recover from the paralysis. 4. Discitis-This is an infection of the intervertebral discs.  It occurs in about 1% of discography procedures.  It is difficult to treat and it may lead to surgery.        2. Pain: the needles have to go through skin and soft tissues, will cause soreness.       3. Damage to internal structures:  The nerves  to be lesioned may be near blood vessels or    other nerves which can be potentially damaged.       4. Bleeding: Bleeding is more common if the patient is taking blood thinners such as  aspirin, Coumadin, Ticiid, Plavix, etc., or if he/she have some genetic predisposition  such as hemophilia. Bleeding into the spinal canal can cause compression of the spinal  cord with subsequent paralysis.  This would require an emergency  surgery to  decompress and there are no guarantees that the patient would recover from the  paralysis.       5. Pneumothorax:  Puncturing of a lung is a possibility, every time a needle is introduced in  the area of the chest or upper back.  Pneumothorax refers to free air around the  collapsed lung(s), inside of the thoracic cavity (chest cavity).  Another two possible  complications related to a similar event would include: Hemothorax and Chylothorax.   These are variations of the Pneumothorax, where instead of air around the collapsed  lung(s), you may have blood or chyle, respectively.       6. Spinal headaches: They may occur with any procedures in the area of the spine.       7. Persistent CSF (Cerebro-Spinal Fluid) leakage: This is a rare problem, but may occur  with prolonged intrathecal or epidural catheters either due to the formation of a fistulous  track or a dural tear.       8. Nerve damage: By working so close to the spinal cord, there is always a possibility of  nerve damage, which could be as serious as a permanent spinal cord injury with  paralysis.       9. Death:  Although rare, severe deadly allergic reactions known as "Anaphylactic  reaction" can occur to any of the medications used.      10. Worsening of the symptoms:  We can always make thing worse.  What are the chances of something like this happening? Chances of any of this occuring are extremely low.  By statistics, you have more of a chance of getting killed in a motor vehicle accident: while driving to the hospital than any of the above occurring .  Nevertheless, you should be aware that they are possibilities.  In general, it is similar to taking a shower.  Everybody knows that you can slip, hit your head and get killed.  Does that mean that you should not shower again?  Nevertheless always keep in mind that statistics do not mean anything if you happen to be on the wrong side of them.  Even if a procedure has a 1 (one) in a  1,000,000 (million) chance of going wrong, it you happen to be that one..Also, keep in mind that by statistics, you have more of a chance of having something go wrong when taking medications.  Who should not have this procedure? If you are on a blood thinning medication (e.g. Coumadin, Plavix, see list of "Blood Thinners"), or if you have an active infection going on, you should not have the procedure.  If you are taking any blood thinners, please inform your physician.  How should I prepare for this procedure?  Do not eat or drink anything at least six hours prior to the procedure.  Bring a driver with you .  It cannot be a taxi.  Come accompanied by an adult that can drive you back, and that is strong enough to help you if  your legs get weak or numb from the local anesthetic.  Take all of your medicines the morning of the procedure with just enough water to swallow them.  If you have diabetes, make sure that you are scheduled to have your procedure done first thing in the morning, whenever possible.  If you have diabetes, take only half of your insulin dose and notify our nurse that you have done so as soon as you arrive at the clinic.  If you are diabetic, but only take blood sugar pills (oral hypoglycemic), then do not take them on the morning of your procedure.  You may take them after you have had the procedure.  Do not take aspirin or any aspirin-containing medications, at least eleven (11) days prior to the procedure.  They may prolong bleeding.  Wear loose fitting clothing that may be easy to take off and that you would not mind if it got stained with Betadine or blood.  Do not wear any jewelry or perfume  Remove any nail coloring.  It will interfere with some of our monitoring equipment.  NOTE: Remember that this is not meant to be interpreted as a complete list of all possible complications.  Unforeseen problems may occur.  BLOOD THINNERS The following drugs contain aspirin  or other products, which can cause increased bleeding during surgery and should not be taken for 2 weeks prior to and 1 week after surgery.  If you should need take something for relief of minor pain, you may take acetaminophen which is found in Tylenol,m Datril, Anacin-3 and Panadol. It is not blood thinner. The products listed below are.  Do not take any of the products listed below in addition to any listed on your instruction sheet.  A.P.C or A.P.C with Codeine Codeine Phosphate Capsules #3 Ibuprofen Ridaura  ABC compound Congesprin Imuran rimadil  Advil Cope Indocin Robaxisal  Alka-Seltzer Effervescent Pain Reliever and Antacid Coricidin or Coricidin-D  Indomethacin Rufen  Alka-Seltzer plus Cold Medicine Cosprin Ketoprofen S-A-C Tablets  Anacin Analgesic Tablets or Capsules Coumadin Korlgesic Salflex  Anacin Extra Strength Analgesic tablets or capsules CP-2 Tablets Lanoril Salicylate  Anaprox Cuprimine Capsules Levenox Salocol  Anexsia-D Dalteparin Magan Salsalate  Anodynos Darvon compound Magnesium Salicylate Sine-off  Ansaid Dasin Capsules Magsal Sodium Salicylate  Anturane Depen Capsules Marnal Soma  APF Arthritis pain formula Dewitt's Pills Measurin Stanback  Argesic Dia-Gesic Meclofenamic Sulfinpyrazone  Arthritis Bayer Timed Release Aspirin Diclofenac Meclomen Sulindac  Arthritis pain formula Anacin Dicumarol Medipren Supac  Analgesic (Safety coated) Arthralgen Diffunasal Mefanamic Suprofen  Arthritis Strength Bufferin Dihydrocodeine Mepro Compound Suprol  Arthropan liquid Dopirydamole Methcarbomol with Aspirin Synalgos  ASA tablets/Enseals Disalcid Micrainin Tagament  Ascriptin Doan's Midol Talwin  Ascriptin A/D Dolene Mobidin Tanderil  Ascriptin Extra Strength Dolobid Moblgesic Ticlid  Ascriptin with Codeine Doloprin or Doloprin with Codeine Momentum Tolectin  Asperbuf Duoprin Mono-gesic Trendar  Aspergum Duradyne Motrin or Motrin IB Triminicin  Aspirin plain, buffered or  enteric coated Durasal Myochrisine Trigesic  Aspirin Suppositories Easprin Nalfon Trillsate  Aspirin with Codeine Ecotrin Regular or Extra Strength Naprosyn Uracel  Atromid-S Efficin Naproxen Ursinus  Auranofin Capsules Elmiron Neocylate Vanquish  Axotal Emagrin Norgesic Verin  Azathioprine Empirin or Empirin with Codeine Normiflo Vitamin E  Azolid Emprazil Nuprin Voltaren  Bayer Aspirin plain, buffered or children's or timed BC Tablets or powders Encaprin Orgaran Warfarin Sodium  Buff-a-Comp Enoxaparin Orudis Zorpin  Buff-a-Comp with Codeine Equegesic Os-Cal-Gesic   Buffaprin Excedrin plain, buffered or Extra Strength Oxalid   Bufferin Arthritis  Strength Feldene Oxphenbutazone   Bufferin plain or Extra Strength Feldene Capsules Oxycodone with Aspirin   Bufferin with Codeine Fenoprofen Fenoprofen Pabalate or Pabalate-SF   Buffets II Flogesic Panagesic   Buffinol plain or Extra Strength Florinal or Florinal with Codeine Panwarfarin   Buf-Tabs Flurbiprofen Penicillamine   Butalbital Compound Four-way cold tablets Penicillin   Butazolidin Fragmin Pepto-Bismol   Carbenicillin Geminisyn Percodan   Carna Arthritis Reliever Geopen Persantine   Carprofen Gold's salt Persistin   Chloramphenicol Goody's Phenylbutazone   Chloromycetin Haltrain Piroxlcam   Clmetidine heparin Plaquenil   Cllnoril Hyco-pap Ponstel   Clofibrate Hydroxy chloroquine Propoxyphen         Before stopping any of these medications, be sure to consult the physician who ordered them.  Some, such as Coumadin (Warfarin) are ordered to prevent or treat serious conditions such as "deep thrombosis", "pumonary embolisms", and other heart problems.  The amount of time that you may need off of the medication may also vary with the medication and the reason for which you were taking it.  If you are taking any of these medications, please make sure you notify your pain physician before you undergo any procedures.          

## 2014-06-22 ENCOUNTER — Telehealth: Payer: Self-pay | Admitting: *Deleted

## 2014-06-22 NOTE — Telephone Encounter (Signed)
duplicate

## 2014-06-22 NOTE — Telephone Encounter (Signed)
Denies problems.

## 2014-07-08 ENCOUNTER — Other Ambulatory Visit: Payer: Self-pay | Admitting: Pain Medicine

## 2014-07-08 ENCOUNTER — Telehealth: Payer: Self-pay

## 2014-07-08 MED ORDER — DULOXETINE HCL 30 MG PO CPEP: ORAL_CAPSULE | ORAL | Status: DC

## 2014-07-08 NOTE — Telephone Encounter (Signed)
Dr. Primus Bravo, According to our records, Mr. Mcgovern was scheduled to be out of meds on 07-05-14. His next appt. Is 07-22-14.

## 2014-07-08 NOTE — Telephone Encounter (Signed)
Nurses and staff,  Please call patient to him inform him that Cymbalta has been E prescribed to his pharmacy

## 2014-07-08 NOTE — Telephone Encounter (Signed)
Patient notified Cymbalta has been e-scribed.

## 2014-07-08 NOTE — Telephone Encounter (Signed)
Pt is about to run out of meds.. Pt stated that he did not get another prescription when he was here for his last procedure. Pt wants Dr. Primus Bravo to write him a prescription

## 2014-07-21 ENCOUNTER — Encounter: Payer: Self-pay | Admitting: Family Medicine

## 2014-07-21 ENCOUNTER — Ambulatory Visit (INDEPENDENT_AMBULATORY_CARE_PROVIDER_SITE_OTHER): Payer: Medicare Other | Admitting: Family Medicine

## 2014-07-21 VITALS — BP 111/66 | HR 65 | Temp 98.7°F | Ht 66.5 in | Wt 153.0 lb

## 2014-07-21 DIAGNOSIS — R310 Gross hematuria: Secondary | ICD-10-CM

## 2014-07-21 DIAGNOSIS — N501 Vascular disorders of male genital organs: Secondary | ICD-10-CM

## 2014-07-21 LAB — URINALYSIS, ROUTINE W REFLEX MICROSCOPIC
Bilirubin, UA: NEGATIVE
Glucose, UA: NEGATIVE
Ketones, UA: NEGATIVE
Nitrite, UA: NEGATIVE
PH UA: 5.5 (ref 5.0–7.5)
Protein, UA: NEGATIVE
RBC, UA: NEGATIVE
SPEC GRAV UA: 1.025 (ref 1.005–1.030)
UUROB: 0.2 mg/dL (ref 0.2–1.0)

## 2014-07-21 LAB — MICROSCOPIC EXAMINATION

## 2014-07-21 NOTE — Progress Notes (Signed)
   BP 111/66 mmHg  Pulse 65  Temp(Src) 98.7 F (37.1 C)  Ht 5' 6.5" (1.689 m)  Wt 153 lb (69.4 kg)  BMI 24.33 kg/m2  SpO2 96%   Subjective:    Patient ID: Timothy Knapp, male    DOB: 1940-08-14, 74 y.o.   MRN: [id number]  HPI: Timothy Knapp is a 74 y.o. male  Chief Complaint  Patient presents with  . Groin Swelling    left side, x 1 week  lt testicle swelling last week passed slight blood for 1 day  No trauma  Was doing a lot of heavy lifting digging Restored farm tractor Medical problems and meds stable  Relevant past medical, surgical, family and social history reviewed and updated as indicated. Interim medical history since our last visit reviewed. Allergies and medications reviewed and updated.  Review of Systems  Constitutional: Negative.   Respiratory: Negative.   Cardiovascular: Negative.   Gastrointestinal: Negative for nausea, abdominal pain, diarrhea, constipation, blood in stool, abdominal distention, anal bleeding and rectal pain.  Genitourinary: Positive for hematuria, scrotal swelling and testicular pain. Negative for dysuria, urgency, frequency, flank pain, discharge, enuresis, difficulty urinating and genital sores.    Per HPI unless specifically indicated above     Objective:    BP 111/66 mmHg  Pulse 65  Temp(Src) 98.7 F (37.1 C)  Ht 5' 6.5" (1.689 m)  Wt 153 lb (69.4 kg)  BMI 24.33 kg/m2  SpO2 96%  Wt Readings from Last 3 Encounters:  07/21/14 153 lb (69.4 kg)  06/21/14 155 lb (70.308 kg)  06/03/14 155 lb (70.308 kg)    Physical Exam  Constitutional: He is oriented to person, place, and time. He appears well-developed and well-nourished. No distress.  HENT:  Head: Normocephalic and atraumatic.  Right Ear: Hearing normal.  Left Ear: Hearing normal.  Nose: Nose normal.  Eyes: Conjunctivae and lids are normal. Right eye exhibits no discharge. Left eye exhibits no discharge. No scleral icterus.  Pulmonary/Chest: Effort normal. No  respiratory distress.  Abdominal: Hernia confirmed negative in the right inguinal area and confirmed negative in the left inguinal area.  Genitourinary: Rectum normal, prostate normal and penis normal. Right testis shows no mass, no swelling and no tenderness. Left testis shows mass, swelling and tenderness.  Lt scrotal area showing brusing   Musculoskeletal: Normal range of motion.  Neurological: He is alert and oriented to person, place, and time.  Skin: Skin is intact. No rash noted.  Psychiatric: He has a normal mood and affect. His speech is normal and behavior is normal. Judgment and thought content normal. Cognition and memory are normal.        Assessment & Plan:   Problem List Items Addressed This Visit    None    Visit Diagnoses    Hematuria, gross    -  Primary    Relevant Orders    Urinalysis, Routine w reflex microscopic (not at [facility])    Bruise of testicle, nontraumatic        discuss varocoties probable cause from lifting etc scrotal support and care if prob recheck        Follow up plan: Return for Physical Exam.

## 2014-07-22 ENCOUNTER — Encounter: Payer: Self-pay | Admitting: Pain Medicine

## 2014-07-22 ENCOUNTER — Ambulatory Visit: Payer: Medicare Other | Attending: Pain Medicine | Admitting: Pain Medicine

## 2014-07-22 VITALS — BP 119/61 | HR 60 | Temp 98.2°F | Resp 16 | Ht 69.0 in | Wt 155.0 lb

## 2014-07-22 DIAGNOSIS — B0229 Other postherpetic nervous system involvement: Secondary | ICD-10-CM | POA: Insufficient documentation

## 2014-07-22 DIAGNOSIS — M5134 Other intervertebral disc degeneration, thoracic region: Secondary | ICD-10-CM

## 2014-07-22 DIAGNOSIS — M546 Pain in thoracic spine: Secondary | ICD-10-CM | POA: Diagnosis present

## 2014-07-22 DIAGNOSIS — G588 Other specified mononeuropathies: Secondary | ICD-10-CM | POA: Diagnosis not present

## 2014-07-22 DIAGNOSIS — Z79899 Other long term (current) drug therapy: Secondary | ICD-10-CM | POA: Diagnosis not present

## 2014-07-22 DIAGNOSIS — M47817 Spondylosis without myelopathy or radiculopathy, lumbosacral region: Secondary | ICD-10-CM | POA: Diagnosis not present

## 2014-07-22 DIAGNOSIS — R0782 Intercostal pain: Secondary | ICD-10-CM | POA: Diagnosis not present

## 2014-07-22 DIAGNOSIS — F1120 Opioid dependence, uncomplicated: Secondary | ICD-10-CM | POA: Diagnosis not present

## 2014-07-22 DIAGNOSIS — Z5181 Encounter for therapeutic drug level monitoring: Secondary | ICD-10-CM | POA: Diagnosis not present

## 2014-07-22 DIAGNOSIS — Z0389 Encounter for observation for other suspected diseases and conditions ruled out: Secondary | ICD-10-CM | POA: Diagnosis not present

## 2014-07-22 DIAGNOSIS — M5416 Radiculopathy, lumbar region: Secondary | ICD-10-CM | POA: Diagnosis not present

## 2014-07-22 MED ORDER — DULOXETINE HCL 30 MG PO CPEP: ORAL_CAPSULE | ORAL | Status: DC

## 2014-07-22 MED ORDER — GABAPENTIN 800 MG PO TABS: ORAL_TABLET | ORAL | Status: DC

## 2014-07-22 MED ORDER — OXYCODONE HCL 5 MG PO TABS: ORAL_TABLET | ORAL | Status: DC

## 2014-07-22 NOTE — Progress Notes (Signed)
   Subjective:    Patient ID: Timothy Knapp, male    DOB: 07/02/1940, 74 y.o.   MRN: [id number]  HPI  Patient 74 year old gentleman returns to [facility] for further evaluation and treatment of pain involving the thoracic region due to postherpetic neuralgia. Patient states since pain is well controlled at this time status post intercostal nerve blocks performed in [facility] at time of previous visits to pain management Center. We will continue Neurontin and Cymbalta as discussed and patient is to call Pain Management Center the change in condition prior to scheduled return appointment. The patient is understanding and agree suggested treatment plan     Review of Systems     Objective:   Physical Exam  I'll tenderness of the splenius capitis and occipitalis musculature region. There was unremarkable Spurling's maneuver. Mild tenderness of the cervical facet region noted. Patient appeared to be with bilaterally equal grip strength. Tinel and Phalen's maneuver were without increase of pain of significant degree. Palpation over the thoracic facet thoracic paraspinal musculature region was tender to palpation on the right compared to the left. The area of most significant tenderness to palpation appeared to be dermatomal distribution approximately T7 - T 10. There was slight increase of pain with grasping skin between the digits. No crepitus of the thoracic region was noted. There was minimal tenderness over the lumbar facet lumbar paraspinal musculature region. Extension and palpation of the lumbar facets reproduced minimal discomfort. Straight leg raising tolerated to 30 without increased pain with dorsiflexion noted. DTRs appear to be trace at the knees. There was negative clonus negative Homans. Abdomen was nontender predominantly with mild tenderness dermatomal distribution to the 07-2008 and no costovertebral angle tenderness noted.      Assessment & Plan:   Postherpetic neuralgia of thoracic region  Intercostal neuralgia   Plan   Continue present medications Cymbalta and Neurontin and oxycodone   F/U PCP Dr. Jeananne Rama for evaliation of  BP and general medical  condition.  F/U surgical evaluation  F/U neurological evaluation  May consider radiofrequency rhizolysis or intraspinal procedures pending response to present treatment and F/U evaluation.  Patient to call [facility] Management Center should patient have concerns prior to scheduled return appointment.

## 2014-07-22 NOTE — Progress Notes (Signed)
Safety precautions to be maintained throughout the outpatient stay will include: orient to surroundings, keep bed in low position, maintain call bell within reach at all times, provide assistance with transfer out of bed and ambulation.  

## 2014-07-22 NOTE — Patient Instructions (Addendum)
Continue present medications Neurontin and Cymbalta and oxycodone  F/U PCP Dr. Jeananne Rama for evaliation of  BP and general medical  condition.  F/U surgical evaluation  F/U neurological evaluation  May consider radiofrequency rhizolysis or intraspinal procedures pending response to present treatment and F/U evaluation.  Patient to call Pain Management Center should patient have concerns prior to scheduled return appointment.  You were given a script for Oxycodone today. Scripts for Cymbalta and Neurontin were sent to your pharmacy.

## 2014-07-26 ENCOUNTER — Telehealth: Payer: Self-pay | Admitting: Pain Medicine

## 2014-07-26 ENCOUNTER — Other Ambulatory Visit: Payer: Self-pay | Admitting: Pain Medicine

## 2014-07-26 MED ORDER — OXYCODONE HCL 5 MG PO TABS: ORAL_TABLET | ORAL | Status: DC

## 2014-07-26 MED ORDER — GABAPENTIN 800 MG PO TABS: ORAL_TABLET | ORAL | Status: DC

## 2014-07-26 NOTE — Telephone Encounter (Signed)
Has not received meds from pharmacy mail in/ out of meds would dr crisp write a week of meds to do until he gets mail in pharmacy meds

## 2014-07-26 NOTE — Telephone Encounter (Signed)
Dr. Primus Bravo will print scripts for oxycodone and gabapentin for 1 week supply. Patient to pick up today.

## 2014-08-02 ENCOUNTER — Telehealth: Payer: Self-pay | Admitting: Pain Medicine

## 2014-08-02 NOTE — Telephone Encounter (Signed)
Needs to talk to someone about getting meds pre approved through [facility]

## 2014-08-02 NOTE — Telephone Encounter (Signed)
Will call [facility] to clarify RX  [phone] Bethann Humble RN

## 2014-08-02 NOTE — Telephone Encounter (Signed)
[facility] called . Questions clarified. patient called.

## 2014-08-03 ENCOUNTER — Telehealth: Payer: Self-pay | Admitting: Pain Medicine

## 2014-08-03 ENCOUNTER — Other Ambulatory Visit: Payer: Self-pay | Admitting: Pain Medicine

## 2014-08-03 ENCOUNTER — Other Ambulatory Visit: Payer: Self-pay | Admitting: Family Medicine

## 2014-08-03 MED ORDER — GABAPENTIN 800 MG PO TABS: ORAL_TABLET | ORAL | Status: DC

## 2014-08-03 MED ORDER — OXYCODONE HCL 5 MG PO TABS: ORAL_TABLET | ORAL | Status: DC

## 2014-08-03 NOTE — Telephone Encounter (Signed)
Dr. Primus Bravo              Patient will not get RX until 10 days.Will you write 10 day supply of gabapentin and oxycodone to hold patient until mail order arrives?

## 2014-08-03 NOTE — Telephone Encounter (Signed)
vmail left by patient at 2:57 08-03-14 needs another script for gabapetin / mail in script will not be delivered until 08-10-14 / just spoke to nurse but wants to be called back

## 2014-08-03 NOTE — Telephone Encounter (Signed)
Takes Oxycodone 2-5 / day Gabapentin 1 1/2 per day

## 2014-08-03 NOTE — Telephone Encounter (Signed)
9:10 patient called to say he needs 10 day script for oxycodone as well as gabapentin

## 2014-08-04 NOTE — Telephone Encounter (Signed)
Written by Dr. Primus Bravo. Not needed to be done by [place].

## 2014-08-05 ENCOUNTER — Other Ambulatory Visit: Payer: Self-pay | Admitting: Pain Medicine

## 2014-08-24 ENCOUNTER — Ambulatory Visit: Payer: Medicare Other | Attending: Pain Medicine | Admitting: Pain Medicine

## 2014-08-24 VITALS — BP 114/57 | HR 52 | Temp 98.2°F | Resp 16 | Ht 69.0 in | Wt 155.0 lb

## 2014-08-24 DIAGNOSIS — M47817 Spondylosis without myelopathy or radiculopathy, lumbosacral region: Secondary | ICD-10-CM | POA: Diagnosis not present

## 2014-08-24 DIAGNOSIS — G588 Other specified mononeuropathies: Secondary | ICD-10-CM | POA: Diagnosis not present

## 2014-08-24 DIAGNOSIS — M5134 Other intervertebral disc degeneration, thoracic region: Secondary | ICD-10-CM

## 2014-08-24 DIAGNOSIS — B0229 Other postherpetic nervous system involvement: Secondary | ICD-10-CM

## 2014-08-24 DIAGNOSIS — M5416 Radiculopathy, lumbar region: Secondary | ICD-10-CM | POA: Diagnosis not present

## 2014-08-24 DIAGNOSIS — M546 Pain in thoracic spine: Secondary | ICD-10-CM | POA: Diagnosis present

## 2014-08-24 DIAGNOSIS — R0782 Intercostal pain: Secondary | ICD-10-CM | POA: Diagnosis not present

## 2014-08-24 MED ORDER — OXYCODONE HCL 5 MG PO TABS: ORAL_TABLET | ORAL | Status: DC

## 2014-08-24 MED ORDER — DULOXETINE HCL 30 MG PO CPEP: ORAL_CAPSULE | ORAL | Status: DC

## 2014-08-24 MED ORDER — METANX 3-35-2 MG PO TABS
ORAL_TABLET | ORAL | Status: DC
Start: 2014-08-24 — End: 2014-09-21

## 2014-08-24 NOTE — Progress Notes (Signed)
   Subjective:    Patient ID: Timothy Knapp, male    DOB: Feb 14, 1940, 74 y.o.   MRN: [id number]  HPI  Patient is 74 year old gentleman returns to [facility] for further evaluation and treatment of pain involving the right thoracic region. Patient has had significant improvement of his pain and has undergone interventional treatment with significant relief of pain of the right thoracic region patient states that when he came to the pain clinic he could not allow his close to touch his skin of the right thoracic region. At the present time patient states he is wearing clothes without any discomfort and that he is able to apply the seatbelt which goes across the area of his prior shingles and is without any significant pain or discomfort. We discussed medications on today's visit and patient will continue Neurontin Cymbalta oxycodone and will take vitamin B complex. We will also attempt to have patient approved for Metanx and patient will take B complex as well. We will remain available considered for interventional treatment pending response to treatment and follow-up evaluation. The patient was understanding and in agreement status treatment plan.    Review of Systems     Objective:   Physical Exam  There was mild tinnitus of the spleen is Occipitalis musculature region. Mild tends of the cervical facet cervical paraspinal musculature region. There appeared to be unremarkable Spurling's maneuver and patient was with bilaterally equal grip strength. Tinel and Phalen's maneuver were without increase of pain significant degree. Palpation of the thoracic region on the right was a tends to palpation of the thoracic region of dermatomal distribution of approximately T4-T8 8 with mid axillary line distribution at the T4 T8 dermatomal region. There was mild increased pain with grasping skin between the digits. There was no of abdominal fluid wave or shifting dullness palpation over the lumbar  paraspinal musculature and lumbar facet region was with mild tends to palpation there was minimal tenderness of the PSIS PII S region gluteal and piriformis musculature region. Palpation of the greater trochanteric region was with minimal discomfort as well. Straight leg raising tolerates approximately 30 without increased pain with dorsiflexion noted. There was negative clonus negative Homans.      Assessment & Plan:    Postherpetic neuralgia of thoracic region  Intercostal neuralgia    Plan  Continue present medications Cymbalta Neurontin and oxycodone. We will also discussed patient tapering his Cymbalta which she wish to taper in attempt to discontinue. Patient will attempt to decrease the Cymbalta and discontinued the Cymbalta. We will also discuss Metanx for treatment of any burning stinging pain. The patient will continue vitamin B complex and we will attempt to have the Metanx approved by the insurance company  F/U PCP Dr. Jeananne Rama for evaliation of  BP and general medical  condition  F/U surgical evaluation will be avoided at this time  F/U neurological evaluation will be avoided at this time  May consider radiofrequency rhizolysis or intraspinal procedures pending response to present treatment and F/U evaluation   Patient to call Pain Management Center should patient have concerns prior to scheduled return appointmen.

## 2014-08-24 NOTE — Patient Instructions (Addendum)
Continue present medications Neurontin  Cymbalta and oxycodone . You may taper the Cymbalta as we discussed an attempt to discontinue as we discussed. Continue vitamin B complex as discussed and we will attempt to see if your insurance company will approve Metanx which treats burning and stinging pain as well  F/U PCP Dr. Jeananne Rama for evaliation of  BP and general medical  condition  F/U surgical evaluation  F/U neurological evaluation  May consider radiofrequency rhizolysis or intraspinal procedures pending response to present treatment and F/U evaluation   Patient to call [facility] should patient have concerns prior to scheduled return appointmen.

## 2014-08-24 NOTE — Progress Notes (Signed)
   Subjective:    Patient ID: Timothy Knapp, male    DOB: 06/09/1940, 74 y.o.   MRN: [id number]  HPI    Review of Systems     Objective:   Physical Exam        Assessment & Plan:  .p

## 2014-08-24 NOTE — Progress Notes (Signed)
Safety precautions to be maintained throughout the outpatient stay will include: orient to surroundings, keep bed in low position, maintain call bell within reach at all times, provide assistance with transfer out of bed and ambulation.  

## 2014-08-25 ENCOUNTER — Other Ambulatory Visit: Payer: Self-pay | Admitting: Family Medicine

## 2014-09-21 ENCOUNTER — Ambulatory Visit: Payer: Medicare Other | Attending: Pain Medicine | Admitting: Pain Medicine

## 2014-09-21 ENCOUNTER — Encounter: Payer: Self-pay | Admitting: Pain Medicine

## 2014-09-21 VITALS — BP 118/55 | HR 52 | Temp 97.6°F | Resp 16 | Ht 69.0 in | Wt 155.0 lb

## 2014-09-21 DIAGNOSIS — G588 Other specified mononeuropathies: Secondary | ICD-10-CM | POA: Insufficient documentation

## 2014-09-21 DIAGNOSIS — M5416 Radiculopathy, lumbar region: Secondary | ICD-10-CM | POA: Diagnosis not present

## 2014-09-21 DIAGNOSIS — R0782 Intercostal pain: Secondary | ICD-10-CM | POA: Diagnosis not present

## 2014-09-21 DIAGNOSIS — M5134 Other intervertebral disc degeneration, thoracic region: Secondary | ICD-10-CM

## 2014-09-21 DIAGNOSIS — M47817 Spondylosis without myelopathy or radiculopathy, lumbosacral region: Secondary | ICD-10-CM | POA: Diagnosis not present

## 2014-09-21 DIAGNOSIS — M546 Pain in thoracic spine: Secondary | ICD-10-CM | POA: Insufficient documentation

## 2014-09-21 DIAGNOSIS — B0229 Other postherpetic nervous system involvement: Secondary | ICD-10-CM | POA: Diagnosis not present

## 2014-09-21 MED ORDER — METANX 3-35-2 MG PO TABS: ORAL_TABLET | ORAL | Status: DC

## 2014-09-21 MED ORDER — GABAPENTIN 800 MG PO TABS: ORAL_TABLET | ORAL | Status: DC

## 2014-09-21 MED ORDER — OXYCODONE HCL 5 MG PO TABS: ORAL_TABLET | ORAL | Status: DC

## 2014-09-21 NOTE — Patient Instructions (Addendum)
PLAN   Continue present medication Neurontin Metanx and oxycodone   F/U PCP Dr. Jeananne Rama for evaliation of  BP and general medical  condition  F/U surgical evaluation. May consider pending follow-up evaluations  F/U neurological evaluation. May consider pending follow-up evaluations  May consider radiofrequency rhizolysis or intraspinal procedures pending response to present treatment and F/U evaluation   Patient to call [facility] should patient have concerns prior to scheduled return appointment.

## 2014-09-21 NOTE — Progress Notes (Signed)
Safety precautions to be maintained throughout the outpatient stay will include: orient to surroundings, keep bed in low position, maintain call bell within reach at all times, provide assistance with transfer out of bed and ambulation.  

## 2014-09-21 NOTE — Progress Notes (Signed)
   Subjective:    Patient ID: Timothy Knapp, male    DOB: 04-08-1940, 74 y.o.   MRN: [id number]  HPI  Patient is 74 year old gentleman who returns to [facility] for further evaluation and treatment of pain involving the thoracic region on the right. Patient states he is doing remarkably well. Patient has had pain involving the right thoracic region felt to be due to postherpetic neuralgia. At the present time patient's tolerating medications Neurontin Metanx and oxycodone very well. Patient states that he is tapering his Cymbalta at this time. We will avoid interventional treatment and will continue present treatment regimen. The patient is in agreement with suggested treatment plan.     Review of Systems     Objective:   Physical Exam  There was tenderness of the splenius capitis and occipitalis region of mild degree. There was mild tends of the acromioclavicular glenohumeral joint region. Patient with bilaterally equal grip strength. Tinel and Phalen's maneuver without increased pain of significant degree. Minimal tenderness over the region of the cervical facet cervical paraspinal musculature region. There was very mild tends to palpation of the thoracic region on the right dermatomal distribution proximally T4 to to the 10. There was no increase of pain with grasping skin between the digits There were no new lesions of the skin noted patient was able to perform range of motion maneuvers of the upper extremities without any pain. There was no tenderness over the lumbar paraspinal musculature and lumbar facet region. No tenderness of the PSIS PSIS region. Straight leg raising tolerated to 30 without increased pain with dorsiflexion noted. Negative clonus negative Homans. Abdomen nontender no costovertebral angle tenderness noted.     Assessment & Plan:    Postherpetic neuralgia  Intercostal neuralgia   PLAN   Continue present medication Neurontin Metanx and oxycodone .  Patient is tapering Cymbalta and is planning to discontinue Cymbalta. We cautioned patient regarding the Cymbalta decrease and discontinuation of Cymbalta. We informed patient that Cymbalta is very effective in treating neuropathic pain and may need to be continued. We will remain of available to consider modifications of medications. Response to the present plan  F/U PCP Dr. Jeananne Rama  for evaliation of  BP and general medical  condition  F/U surgical evaluation. May consider pending follow-up evaluations  F/U neurological evaluation. May consider pending follow-up evaluations  May consider radiofrequency rhizolysis or intraspinal procedures pending response to present treatment and F/U evaluation   Patient to call [facility] should patient have concerns prior to scheduled return appointment.

## 2014-09-27 ENCOUNTER — Telehealth: Payer: Self-pay

## 2014-09-27 ENCOUNTER — Ambulatory Visit (INDEPENDENT_AMBULATORY_CARE_PROVIDER_SITE_OTHER): Payer: Medicare Other | Admitting: Family Medicine

## 2014-09-27 ENCOUNTER — Encounter: Payer: Self-pay | Admitting: Family Medicine

## 2014-09-27 VITALS — BP 115/70 | HR 57 | Temp 98.0°F | Ht 65.7 in | Wt 152.0 lb

## 2014-09-27 DIAGNOSIS — B0229 Other postherpetic nervous system involvement: Secondary | ICD-10-CM | POA: Diagnosis not present

## 2014-09-27 DIAGNOSIS — E785 Hyperlipidemia, unspecified: Secondary | ICD-10-CM

## 2014-09-27 DIAGNOSIS — N400 Enlarged prostate without lower urinary tract symptoms: Secondary | ICD-10-CM

## 2014-09-27 DIAGNOSIS — I10 Essential (primary) hypertension: Secondary | ICD-10-CM

## 2014-09-27 DIAGNOSIS — Z0000 Encounter for general adult medical examination without abnormal findings: Secondary | ICD-10-CM | POA: Diagnosis not present

## 2014-09-27 DIAGNOSIS — N401 Enlarged prostate with lower urinary tract symptoms: Secondary | ICD-10-CM | POA: Insufficient documentation

## 2014-09-27 LAB — URINALYSIS, ROUTINE W REFLEX MICROSCOPIC
BILIRUBIN UA: NEGATIVE
Glucose, UA: NEGATIVE
KETONES UA: NEGATIVE
NITRITE UA: NEGATIVE
PH UA: 5.5 (ref 5.0–7.5)
Protein, UA: NEGATIVE
RBC UA: NEGATIVE
SPEC GRAV UA: 1.025 (ref 1.005–1.030)
UUROB: 0.2 mg/dL (ref 0.2–1.0)

## 2014-09-27 LAB — LIPID PANEL PICCOLO, WAIVED
CHOL/HDL RATIO PICCOLO,WAIVE: 3 mg/dL
CHOLESTEROL PICCOLO, WAIVED: 156 mg/dL (ref <200)
HDL Chol Piccolo, Waived: 52 mg/dL — ABNORMAL LOW (ref >59)
LDL Chol Calc Piccolo Waived: 79 mg/dL (ref <100)
Triglycerides Piccolo,Waived: 127 mg/dL (ref <150)
VLDL Chol Calc Piccolo,Waive: 25 mg/dL (ref <30)

## 2014-09-27 LAB — MICROSCOPIC EXAMINATION: RENAL EPITHEL UA: NONE SEEN /HPF

## 2014-09-27 MED ORDER — BENAZEPRIL HCL 20 MG PO TABS: 20.0000 mg | ORAL_TABLET | Freq: Every day | ORAL | Status: DC

## 2014-09-27 NOTE — Telephone Encounter (Signed)
Pt called stated the non prescribed medications were successfully removed from the list. Ed Santino. Thanks.

## 2014-09-27 NOTE — Assessment & Plan Note (Addendum)
Discussed blood pressure will decrease to 20 mg observe blood pressure response Patient will report back on whether to continue decreasing dose and stop or go back to original dose.

## 2014-09-27 NOTE — Assessment & Plan Note (Signed)
With diet change and weight loss will discontinue Lipitor Recheck in 2+ weeks lab only lipid panel

## 2014-09-27 NOTE — Assessment & Plan Note (Signed)
Discussed zostrix

## 2014-09-27 NOTE — Progress Notes (Signed)
BP 115/70 mmHg  Pulse 57  Temp(Src) 98 F (36.7 C)  Ht 5' 5.7" (1.669 m)  Wt 152 lb (68.947 kg)  BMI 24.75 kg/m2  SpO2 97%   Subjective:    Patient ID: Timothy Knapp, male    DOB: 1940/07/25, 74 y.o.   MRN: [id number]  HPI: Timothy Knapp is a 74 y.o. male  Chief Complaint  Patient presents with  . Annual Exam   she doing well on blood pressure no complaints from medications Patient doing well on Lipitor No side effects takes medications faithfully Chronic pain management from postherpetic neuralgia followed by Dr. Primus Bravo  Patient is lost 15 pounds and doing very well wondering if he can cut back and/or stop Lipitor and Benzapril.  Relevant past medical, surgical, family and social history reviewed and updated as indicated. Interim medical history since our last visit reviewed. Allergies and medications reviewed and updated.  Review of Systems  Constitutional: Negative.   HENT: Negative.   Eyes: Negative.   Respiratory: Negative.   Cardiovascular: Negative.   Gastrointestinal: Negative.   Endocrine: Negative.   Genitourinary: Negative.   Musculoskeletal: Negative.   Skin: Negative.   Allergic/Immunologic: Negative.   Neurological: Negative.   Hematological: Negative.   Psychiatric/Behavioral: Negative.     Per HPI unless specifically indicated above     Objective:    BP 115/70 mmHg  Pulse 57  Temp(Src) 98 F (36.7 C)  Ht 5' 5.7" (1.669 m)  Wt 152 lb (68.947 kg)  BMI 24.75 kg/m2  SpO2 97%  Wt Readings from Last 3 Encounters:  09/27/14 152 lb (68.947 kg)  09/21/14 155 lb (70.308 kg)  08/24/14 155 lb (70.308 kg)    Physical Exam  Constitutional: He is oriented to person, place, and time. He appears well-developed and well-nourished.  HENT:  Head: Normocephalic and atraumatic.  Right Ear: External ear normal.  Left Ear: External ear normal.  Eyes: Conjunctivae and EOM are normal. Pupils are equal, round, and reactive to light.  Neck: Normal range  of motion. Neck supple.  Cardiovascular: Normal rate, regular rhythm, normal heart sounds and intact distal pulses.   Pulmonary/Chest: Effort normal and breath sounds normal.  Abdominal: Soft. Bowel sounds are normal. There is no splenomegaly or hepatomegaly.  Genitourinary: Rectum normal, prostate normal and penis normal.  Musculoskeletal: Normal range of motion.  Neurological: He is alert and oriented to person, place, and time. He has normal reflexes.  Skin: No rash noted. No erythema.  Psychiatric: He has a normal mood and affect. His behavior is normal. Judgment and thought content normal.    Results for orders placed or performed in visit on 07/21/14  Microscopic Examination  Result Value Ref Range   WBC, UA 0-5 0 -  5 /hpf   RBC, UA 0-2 0 -  2 /hpf   Epithelial Cells (non renal) 0-10 0 - 10 /hpf   Mucus, UA Present Not Estab.   Bacteria, UA Few None seen/Few  Urinalysis, Routine w reflex microscopic (not at [facility])  Result Value Ref Range   Specific Gravity, UA 1.025 1.005 - 1.030   pH, UA 5.5 5.0 - 7.5   Color, UA Yellow Yellow   Appearance Ur Clear Clear   Leukocytes, UA 1+ (A) Negative   Protein, UA Negative Negative/Trace   Glucose, UA Negative Negative   Ketones, UA Negative Negative   RBC, UA Negative Negative   Bilirubin, UA Negative Negative   Urobilinogen, Ur 0.2 0.2 - 1.0 mg/dL  Nitrite, UA Negative Negative   Microscopic Examination See below:       Assessment & Plan:   Problem List Items Addressed This Visit      Cardiovascular and Mediastinum   Hypertension    Discussed blood pressure will decrease to 20 mg observe blood pressure response Patient will report back on whether to continue decreasing dose and stop or go back to original dose.      Relevant Medications   benazepril (LOTENSIN) 20 MG tablet   Other Relevant Orders   Comprehensive metabolic panel   CBC with Differential/Platelet   TSH   Urinalysis, Routine w reflex microscopic (not at  [facility])     Nervous and Auditory   Postherpetic neuralgia - Primary    Discussed zostrix        Genitourinary   BPH (benign prostatic hypertrophy)   Relevant Orders   PSA     Other   Hyperlipidemia    With diet change and weight loss will discontinue Lipitor Recheck in 2+ weeks lab only lipid panel      Relevant Medications   benazepril (LOTENSIN) 20 MG tablet   Other Relevant Orders   Lipid panel   TSH   Urinalysis, Routine w reflex microscopic (not at [facility])   Lipid Panel [place], [place]    Other Visit Diagnoses    PE (physical exam), annual        Relevant Orders    Comprehensive metabolic panel    Lipid panel    CBC with Differential/Platelet    TSH    Urinalysis, Routine w reflex microscopic (not at [facility])    PSA        Follow up plan: Return in about 6 months (around 03/27/2015), or if symptoms worsen or fail to improve, for med check.

## 2014-09-28 ENCOUNTER — Encounter: Payer: Self-pay | Admitting: Family Medicine

## 2014-09-28 LAB — COMPREHENSIVE METABOLIC PANEL
A/G RATIO: 1.9 (ref 1.1–2.5)
ALK PHOS: 53 IU/L (ref 39–117)
ALT: 29 IU/L (ref 0–44)
AST: 43 IU/L — AB (ref 0–40)
Albumin: 4 g/dL (ref 3.5–4.8)
BUN/Creatinine Ratio: 19 (ref 10–22)
BUN: 18 mg/dL (ref 8–27)
Bilirubin Total: 0.6 mg/dL (ref 0.0–1.2)
CALCIUM: 9.1 mg/dL (ref 8.6–10.2)
CO2: 24 mmol/L (ref 18–29)
Chloride: 101 mmol/L (ref 97–108)
Creatinine, Ser: 0.93 mg/dL (ref 0.76–1.27)
GFR calc Af Amer: 93 mL/min/{1.73_m2} (ref >59)
GFR, EST NON AFRICAN AMERICAN: 80 mL/min/{1.73_m2} (ref >59)
Globulin, Total: 2.1 g/dL (ref 1.5–4.5)
Glucose: 105 mg/dL — ABNORMAL HIGH (ref 65–99)
POTASSIUM: 4.5 mmol/L (ref 3.5–5.2)
Sodium: 140 mmol/L (ref 134–144)
Total Protein: 6.1 g/dL (ref 6.0–8.5)

## 2014-09-28 LAB — CBC WITH DIFFERENTIAL/PLATELET
BASOS ABS: 0.1 10*3/uL (ref 0.0–0.2)
BASOS: 1 %
EOS (ABSOLUTE): 0.2 10*3/uL (ref 0.0–0.4)
Eos: 3 %
Hematocrit: 45 % (ref 37.5–51.0)
Hemoglobin: 14.9 g/dL (ref 12.6–17.7)
IMMATURE GRANS (ABS): 0 10*3/uL (ref 0.0–0.1)
IMMATURE GRANULOCYTES: 0 %
LYMPHS: 21 %
Lymphocytes Absolute: 1.6 10*3/uL (ref 0.7–3.1)
MCH: 30 pg (ref 26.6–33.0)
MCHC: 33.1 g/dL (ref 31.5–35.7)
MCV: 91 fL (ref 79–97)
MONOS ABS: 0.5 10*3/uL (ref 0.1–0.9)
Monocytes: 6 %
NEUTROS PCT: 69 %
Neutrophils Absolute: 5.3 10*3/uL (ref 1.4–7.0)
PLATELETS: 203 10*3/uL (ref 150–379)
RBC: 4.97 x10E6/uL (ref 4.14–5.80)
RDW: 14.1 % (ref 12.3–15.4)
WBC: 7.6 10*3/uL (ref 3.4–10.8)

## 2014-09-28 LAB — PSA: PROSTATE SPECIFIC AG, SERUM: 2.6 ng/mL (ref 0.0–4.0)

## 2014-09-28 LAB — TSH: TSH: 1.5 u[IU]/mL (ref 0.450–4.500)

## 2014-10-20 ENCOUNTER — Encounter: Payer: Self-pay | Admitting: Pain Medicine

## 2014-10-20 ENCOUNTER — Ambulatory Visit: Payer: Medicare Other | Attending: Pain Medicine | Admitting: Pain Medicine

## 2014-10-20 VITALS — BP 129/67 | HR 51 | Temp 98.1°F | Resp 16 | Ht 69.0 in | Wt 155.0 lb

## 2014-10-20 DIAGNOSIS — M5134 Other intervertebral disc degeneration, thoracic region: Secondary | ICD-10-CM

## 2014-10-20 DIAGNOSIS — M47817 Spondylosis without myelopathy or radiculopathy, lumbosacral region: Secondary | ICD-10-CM | POA: Diagnosis not present

## 2014-10-20 DIAGNOSIS — B0229 Other postherpetic nervous system involvement: Secondary | ICD-10-CM | POA: Diagnosis not present

## 2014-10-20 DIAGNOSIS — G588 Other specified mononeuropathies: Secondary | ICD-10-CM | POA: Diagnosis not present

## 2014-10-20 DIAGNOSIS — M546 Pain in thoracic spine: Secondary | ICD-10-CM | POA: Diagnosis present

## 2014-10-20 DIAGNOSIS — M5416 Radiculopathy, lumbar region: Secondary | ICD-10-CM | POA: Diagnosis not present

## 2014-10-20 DIAGNOSIS — R0782 Intercostal pain: Secondary | ICD-10-CM | POA: Diagnosis not present

## 2014-10-20 MED ORDER — METANX 3-35-2 MG PO TABS: ORAL_TABLET | ORAL | Status: DC

## 2014-10-20 MED ORDER — GABAPENTIN 800 MG PO TABS: ORAL_TABLET | ORAL | Status: DC

## 2014-10-20 MED ORDER — OXYCODONE HCL 5 MG PO TABS: ORAL_TABLET | ORAL | Status: DC

## 2014-10-20 NOTE — Progress Notes (Signed)
   Subjective:    Patient ID: Timothy Knapp, male    DOB: 11/24/1940, 74 y.o.   MRN: [id number]  HPI Patient is 74 year old gentleman who returns to [facility] for further evaluation and treatment of pain involving the thoracic region on the right. Patient's pain is been felt to be due to postherpetic neuralgia. Patient states that he is doing remarkably well at this time and is without complaint of pain due to postherpetic neuralgia of the thoracic region. Patient also is without the use of Cymbalta and is taking Neurontin Metanx and oxycodone. We discussed patient's condition and patient states that he continues to be with pain well-controlled. We will avoid interventional treatment and will continue presently prescribed medications. We have asked patient to call us immediately should there be any return of any significant pain which time we will consider modification of treatment regimen. The patient was understanding and agreement with suggested treatment plan The patient was with mild itching sensation of the right upper extremity without evidence of rash the patient will continue to observe the area and will call Pain Management Center should this area persist There was no evidence of lesions of the skin or other abnormalities noted. All understanding and agreement status treatment plan   Review of Systems     Objective:   Physical Exam  There was minimal tenderness of the splenius capitis and occipitalis musculature region. Palpation of the cervical facet cervical paraspinal muscles region and acromioclavicular and glenohumeral joint regions reproduced minimal discomfort. There was unremarkable Spurling's maneuver. Palpation over the thoracic region was without increased pain. There was no evidence of new lesions of the skin of the thoracic region. There was no increased. Pain with grasping skin between the digits in the area were shingles had occurred. Palpation over the lumbar  paraspinal muscle lumbar facet region was without increased pain of significant degree. Straight leg raising was tolerated to 30 without increased pain with dorsiflexion noted. There was negative clonus negative Homans. Thoracic and abdominal regions were without tenderness to palpation and no costovertebral angle tenderness was noted. The previous area of the thoracic or abdominal region where shingles had occurred was palpated as well as firm pressure applied and skin was graspers between the digits without any increased pain     Assessment & Plan:   Postherpetic neuralgia  Intercostal neuralgia    PLAN   Continue present medication Neurontin Metanx and oxycodone   F/U PCP Dr. Jeananne Rama  for evaliation of  BP and general medical  condition  F/U surgical evaluation. May consider pending follow-up evaluations  F/U neurological evaluation. May consider pending follow-up evaluations  May consider radiofrequency rhizolysis or intraspinal procedures pending response to present treatment and F/U evaluation   Patient to call [facility] should patient have concerns prior to scheduled return appointment.

## 2014-10-20 NOTE — Patient Instructions (Signed)
PLAN   Continue present medication Neurontin Metanx and oxycodone   F/U PCP Dr. Jeananne Rama  for evaliation of  BP and general medical  condition  F/U surgical evaluation Patient is without plans for surgical evaluation at this time   F/U neurological evaluation. May consider pending follow-up evaluations  May consider radiofrequency rhizolysis or intraspinal procedures pending response to present treatment and F/U evaluation   Patient to call [facility] should patient have concerns prior to scheduled return appointment.

## 2014-10-20 NOTE — Progress Notes (Signed)
.  pms  

## 2014-11-09 ENCOUNTER — Other Ambulatory Visit: Payer: Self-pay | Admitting: Pain Medicine

## 2014-11-10 ENCOUNTER — Ambulatory Visit: Payer: Medicare Other | Admitting: Pain Medicine

## 2014-11-15 ENCOUNTER — Encounter: Payer: Self-pay | Admitting: Pain Medicine

## 2014-11-15 ENCOUNTER — Ambulatory Visit: Payer: Medicare Other | Attending: Pain Medicine | Admitting: Pain Medicine

## 2014-11-15 VITALS — BP 123/56 | HR 55 | Temp 97.8°F | Resp 18 | Ht 69.0 in | Wt 155.0 lb

## 2014-11-15 DIAGNOSIS — B0229 Other postherpetic nervous system involvement: Secondary | ICD-10-CM

## 2014-11-15 DIAGNOSIS — M47817 Spondylosis without myelopathy or radiculopathy, lumbosacral region: Secondary | ICD-10-CM | POA: Diagnosis not present

## 2014-11-15 DIAGNOSIS — M546 Pain in thoracic spine: Secondary | ICD-10-CM | POA: Diagnosis not present

## 2014-11-15 DIAGNOSIS — G588 Other specified mononeuropathies: Secondary | ICD-10-CM | POA: Diagnosis not present

## 2014-11-15 DIAGNOSIS — Z5181 Encounter for therapeutic drug level monitoring: Secondary | ICD-10-CM | POA: Diagnosis not present

## 2014-11-15 DIAGNOSIS — Z0389 Encounter for observation for other suspected diseases and conditions ruled out: Secondary | ICD-10-CM | POA: Diagnosis not present

## 2014-11-15 DIAGNOSIS — M5416 Radiculopathy, lumbar region: Secondary | ICD-10-CM | POA: Diagnosis not present

## 2014-11-15 DIAGNOSIS — R0782 Intercostal pain: Secondary | ICD-10-CM | POA: Diagnosis not present

## 2014-11-15 DIAGNOSIS — F1120 Opioid dependence, uncomplicated: Secondary | ICD-10-CM | POA: Diagnosis not present

## 2014-11-15 DIAGNOSIS — M5134 Other intervertebral disc degeneration, thoracic region: Secondary | ICD-10-CM

## 2014-11-15 DIAGNOSIS — Z79899 Other long term (current) drug therapy: Secondary | ICD-10-CM | POA: Diagnosis not present

## 2014-11-15 MED ORDER — OXYCODONE HCL 5 MG PO TABS: ORAL_TABLET | ORAL | Status: DC

## 2014-11-15 NOTE — Progress Notes (Signed)
Safety precautions to be maintained throughout the outpatient stay will include: orient to surroundings, keep bed in low position, maintain call bell within reach at all times, provide assistance with transfer out of bed and ambulation.  

## 2014-11-15 NOTE — Patient Instructions (Signed)
PLAN   Continue present medication Neurontin Metanx and oxycodone   F/U PCP Dr. Jeananne Rama  for evaliation of  BP and general medical  condition  F/U surgical evaluation Patient is without plans for surgical evaluation at this time   F/U neurological evaluation. May consider pending follow-up evaluations  May consider radiofrequency rhizolysis or intraspinal procedures pending response to present treatment and F/U evaluation   Patient to call [facility] should patient have concerns prior to scheduled return appointment.

## 2014-11-15 NOTE — Progress Notes (Signed)
   Subjective:    Patient ID: Timothy Knapp, male    DOB: 1940/04/16, 74 y.o.   MRN: [id number]  HPI  The patient is a 74 year old gentleman who returns to pain management Center for further evaluation and treatment of pain involving the region of the thoracic area on the right. The patient is a prior history of shingles with postherpetic neuralgia pain of the right thoracic region. The patient states that his pain is well controlled present treatment regimen at this time consisting of Neurontin and oxycodone and Metanx. We will continue the present medications as prescribed. We also informed patient that he may wish to consider additional interventional treatment she is pain begin to return to prepare the reestablishment of the severe disabling pain when she was experienced prior to undergoing interventional treatment in pain medicine Center. The patient was understanding and in agreement with this treatment plan and will inform a [place] should there be any return of pain of significant degree. At the present time patient states that the pain is without interference of activities of daily living ability to obtain restlessly and patient is doing quite well.      Review of Systems     Objective:   Physical Exam  There was tenderness to palpation of the splenius In the thalamus but regions of minimal degree. There was minimal tightness of the trapezius levator scapula and rhomboid musculature regions. There was unremarkable Spurling's maneuver. Patient was bilateral equal grip strength and Tinel and Phalen's were without increase in pain of significant degree.Palpation over the thoracic region was cleansed to palpation in the right thoracic region dermatome distribution of T4 to T 11 of mild degree. There was tends to palpation over the lumbar paraspinal musculature lumbosacral regional mental degree. There was minimal tenderness of the PSIS. As region gluteal and piriformis musculature region and  minimal chest palpation of the greater trochanteric region and iliotibial band region. Abdomen was nontender no costovertebral tenderness noted.        Assessment & Plan:      Postherpetic neuralgia  Intercostal neuralgia     PLANContinue present medication Neurontin Metanx and oxycodone   F/U PCP Dr. Jeananne Rama  for evaliation of  BP and general medical  condition  F/U surgical evaluation. May consider pending follow-up evaluations  F/U neurological evaluation. May consider pending follow-up evaluations  May consider radiofrequency rhizolysis or intraspinal procedures pending response to present treatment and F/U evaluation   Patient to call [facility] should patient have concerns prior to scheduled return appointment.

## 2014-11-16 ENCOUNTER — Ambulatory Visit: Payer: Medicare Other | Admitting: Pain Medicine

## 2014-11-22 ENCOUNTER — Telehealth: Payer: Self-pay | Admitting: *Deleted

## 2014-11-22 ENCOUNTER — Telehealth: Payer: Self-pay | Admitting: Pain Medicine

## 2014-11-22 ENCOUNTER — Other Ambulatory Visit: Payer: Self-pay | Admitting: Pain Medicine

## 2014-11-22 MED ORDER — OXYCODONE HCL 5 MG PO TABS: ORAL_TABLET | ORAL | Status: DC

## 2014-11-22 NOTE — Telephone Encounter (Signed)
Patient called by Constance Holster to notify him that he can come pick up a prescription for Oxycodone

## 2014-11-22 NOTE — Telephone Encounter (Signed)
Dr. Primus Bravo Please advise if you will write for 5 day supply until mail order arrives.

## 2014-11-22 NOTE — Telephone Encounter (Signed)
Nurses I have prescribed a 10 day supply of oxycodone Please have patient come to get prescription for 50 pills

## 2014-11-22 NOTE — Telephone Encounter (Signed)
Called pt left message that  His script for oxycodone is at the front desk.

## 2014-11-22 NOTE — Telephone Encounter (Signed)
Needs refill on oxycodone for 5 days until mail in script is delivered [phone]

## 2014-11-23 ENCOUNTER — Telehealth: Payer: Self-pay | Admitting: Family Medicine

## 2014-11-23 NOTE — Telephone Encounter (Signed)
[facility] called stating there is a mix up with Timothy Knapp HCTZ prescription.  Please call them.  Ref# for return call [id number]  [id number] [facility] to reach pharmacist directly.

## 2014-11-24 ENCOUNTER — Telehealth: Payer: Self-pay

## 2014-11-24 ENCOUNTER — Other Ambulatory Visit: Payer: Self-pay | Admitting: Family Medicine

## 2014-11-24 DIAGNOSIS — I10 Essential (primary) hypertension: Secondary | ICD-10-CM

## 2014-11-24 MED ORDER — BENAZEPRIL HCL 20 MG PO TABS: 20.0000 mg | ORAL_TABLET | Freq: Every day | ORAL | Status: DC

## 2014-11-24 NOTE — Telephone Encounter (Signed)
Please send Rx for Benazepril Tabs to [facility] (originally sent to [facility])

## 2014-11-24 NOTE — Telephone Encounter (Signed)
I returned [facility] call. I explained that we didn't understand the mix up because HCTZ wasn't even under his current medication list. The pharmacist said there's nothing in their system that has to do with HCTZ. Nothing processing etc. So she wouldn't know why they called and said there was a mix up.

## 2014-11-25 DIAGNOSIS — H5213 Myopia, bilateral: Secondary | ICD-10-CM | POA: Diagnosis not present

## 2014-11-25 DIAGNOSIS — H25813 Combined forms of age-related cataract, bilateral: Secondary | ICD-10-CM | POA: Diagnosis not present

## 2014-11-25 DIAGNOSIS — H52223 Regular astigmatism, bilateral: Secondary | ICD-10-CM | POA: Diagnosis not present

## 2014-11-25 DIAGNOSIS — H524 Presbyopia: Secondary | ICD-10-CM | POA: Diagnosis not present

## 2014-11-29 ENCOUNTER — Other Ambulatory Visit: Payer: Self-pay | Admitting: Pain Medicine

## 2014-12-14 ENCOUNTER — Ambulatory Visit: Payer: Medicare Other | Attending: Pain Medicine | Admitting: Pain Medicine

## 2014-12-14 ENCOUNTER — Encounter: Payer: Self-pay | Admitting: Pain Medicine

## 2014-12-14 VITALS — BP 122/60 | HR 57 | Temp 97.6°F | Resp 18 | Ht 69.0 in | Wt 155.0 lb

## 2014-12-14 DIAGNOSIS — G588 Other specified mononeuropathies: Secondary | ICD-10-CM | POA: Diagnosis not present

## 2014-12-14 DIAGNOSIS — R0782 Intercostal pain: Secondary | ICD-10-CM | POA: Diagnosis not present

## 2014-12-14 DIAGNOSIS — B0229 Other postherpetic nervous system involvement: Secondary | ICD-10-CM | POA: Diagnosis not present

## 2014-12-14 DIAGNOSIS — M170 Bilateral primary osteoarthritis of knee: Secondary | ICD-10-CM | POA: Insufficient documentation

## 2014-12-14 DIAGNOSIS — M5416 Radiculopathy, lumbar region: Secondary | ICD-10-CM | POA: Diagnosis not present

## 2014-12-14 DIAGNOSIS — M47817 Spondylosis without myelopathy or radiculopathy, lumbosacral region: Secondary | ICD-10-CM | POA: Diagnosis not present

## 2014-12-14 DIAGNOSIS — M546 Pain in thoracic spine: Secondary | ICD-10-CM | POA: Diagnosis present

## 2014-12-14 DIAGNOSIS — M5134 Other intervertebral disc degeneration, thoracic region: Secondary | ICD-10-CM

## 2014-12-14 MED ORDER — GABAPENTIN 800 MG PO TABS: ORAL_TABLET | ORAL | Status: DC

## 2014-12-14 MED ORDER — DULOXETINE HCL 30 MG PO CPEP: ORAL_CAPSULE | ORAL | Status: DC

## 2014-12-14 MED ORDER — OXYCODONE HCL 5 MG PO TABS: ORAL_TABLET | ORAL | Status: DC

## 2014-12-14 NOTE — Progress Notes (Signed)
Safety precautions to be maintained throughout the outpatient stay will include: orient to surroundings, keep bed in low position, maintain call bell within reach at all times, provide assistance with transfer out of bed and ambulation.  

## 2014-12-14 NOTE — Progress Notes (Signed)
   Subjective:    Patient ID: Timothy Knapp, male    DOB: 04-Mar-1940, 74 y.o.   MRN: [id number]  HPI  The patient is a 74 year old gentleman who returns to pain management for further evaluation and treatment of pain involving the thoracic region predominantly. The patient is with history of right side thoracic pain felt to be due to postherpetic neuralgia. The patient has had significant improvement of pain with interventional treatment as well as prescribing her medications. We discussed patient's condition and present time recommend patient resume Cymbalta the patient will continue Neurontin and patient also will continue Metanx and oxycodone. We will remain available to consider interventional treatment including intercostal nerve blocks and other treatment as well. At the present time we will prescribe TENS unit for treatment of patient's thoracic pain. She has pain which is involving the knees felt to be due to degenerative joint disease of the knees. We have discussed surgical evaluation of knees and we have also discussed interventional as well as noninterventional treatment of pain involving the region of the knees. The patient has been advised to follow-up with surgeon regarding his knees and that we will remain available to consider patient for geniculate nerve blocks and other procedures of the knees should that felt to be necessary. The patient was with understanding and agreement suggested treatment plan      Review of Systems     Objective:   Physical Exam  There was minimal tenderness to palpation of the splenius capitis and occipitalis musculature regions. There was minimal tenderness of the acromial clavicular and glenohumeral joint region. The patient was at unremarkable Spurling's maneuver. The patient appeared to be with bilaterally equal grip strength. Tinel and Phalen's maneuver were without increased pain of significant degree. There was tenderness to palpation of the  thoracic region on the right in region of previous shingles. There was tends to palpation of paraspinal misreading thoracic region thoracic facet region a mild degree. No crepitus of the thoracic region noted. There was minimal tenderness to palpation of the abdomen with there being increased tenderness to palpation in the region of previous shingles on the right abdominal region. Straight leg raise was tolerates 30 without increased pain with dorsiflexion noted. DTRs appeared to be trace at the knees of the negative clonus negative Homans. Knees were with out evidence of anterior posterior drawer signs no ballottement of the patella was noted and no increased warmth or erythema of the knees was noted there was crepitus of the knees noted bilaterally. No increased joint laxity noted. Negative clonus negative Homans. No costovertebral tenderness noted.      Assessment & Plan:     Postherpetic neuralgia  Intercostal neuralgia  Degenerative joint disease of knees    PLAN   Continue present medication Neurontin Metanx and oxycodone . Would recommend resuming Cymbalta at this time   F/U PCP Dr. Jeananne Rama  for evaliation of  BP and general medical  condition  F/U surgical evaluation Patient is without plans for surgical evaluation at this time he Discuss pain of knees with surgeon as we discussed  TENS unit for thoracic pain due to postherpetic neuralgia. Ask receptionists to assist you with getting TENS unit  F/U neurological evaluation. May consider pending follow-up evaluations  May consider radiofrequency rhizolysis or intraspinal procedures pending response to present treatment and F/U evaluation   Patient to call [facility] should patient have concerns prior to scheduled return appointment.

## 2014-12-14 NOTE — Patient Instructions (Addendum)
PLAN   Continue present medication Neurontin Metanx and oxycodone . Would recommend resuming Cymbalta at this time   F/U PCP Dr. Jeananne Rama  for evaliation of  BP and general medical  condition  F/U surgical evaluation Patient is without plans for surgical evaluation at this time he Discuss pain of knees with neurosurgeon as we discussed  TENS unit for thoracic pain due to postherpetic neuralgia. Ask receptionists to assist you with getting TENS unit  F/U neurological evaluation. May consider pending follow-up evaluations  May consider radiofrequency rhizolysis or intraspinal procedures pending response to present treatment and F/U evaluation   Patient to call [facility] should patient have concerns prior to scheduled return appointment.

## 2014-12-21 ENCOUNTER — Other Ambulatory Visit: Payer: Self-pay | Admitting: Pain Medicine

## 2014-12-21 NOTE — Telephone Encounter (Signed)
Nurses Please discuss patient's Cymbalta with me and see what we need to do regarding refill of Cymbalta

## 2015-01-11 ENCOUNTER — Encounter: Payer: Self-pay | Admitting: Pain Medicine

## 2015-01-11 ENCOUNTER — Telehealth: Payer: Self-pay | Admitting: *Deleted

## 2015-01-11 ENCOUNTER — Ambulatory Visit: Payer: Medicare Other | Attending: Pain Medicine | Admitting: Pain Medicine

## 2015-01-11 VITALS — BP 119/67 | HR 62 | Temp 97.9°F | Resp 18 | Ht 69.0 in | Wt 155.0 lb

## 2015-01-11 DIAGNOSIS — R21 Rash and other nonspecific skin eruption: Secondary | ICD-10-CM | POA: Insufficient documentation

## 2015-01-11 DIAGNOSIS — M5134 Other intervertebral disc degeneration, thoracic region: Secondary | ICD-10-CM

## 2015-01-11 DIAGNOSIS — G588 Other specified mononeuropathies: Secondary | ICD-10-CM | POA: Insufficient documentation

## 2015-01-11 DIAGNOSIS — M170 Bilateral primary osteoarthritis of knee: Secondary | ICD-10-CM | POA: Diagnosis not present

## 2015-01-11 DIAGNOSIS — M546 Pain in thoracic spine: Secondary | ICD-10-CM | POA: Diagnosis present

## 2015-01-11 DIAGNOSIS — M47817 Spondylosis without myelopathy or radiculopathy, lumbosacral region: Secondary | ICD-10-CM | POA: Diagnosis not present

## 2015-01-11 DIAGNOSIS — B0229 Other postherpetic nervous system involvement: Secondary | ICD-10-CM | POA: Diagnosis not present

## 2015-01-11 DIAGNOSIS — M5416 Radiculopathy, lumbar region: Secondary | ICD-10-CM | POA: Diagnosis not present

## 2015-01-11 DIAGNOSIS — R0782 Intercostal pain: Secondary | ICD-10-CM | POA: Diagnosis not present

## 2015-01-11 MED ORDER — DULOXETINE HCL 30 MG PO CPEP: ORAL_CAPSULE | ORAL | Status: DC

## 2015-01-11 MED ORDER — OXYCODONE HCL 5 MG PO TABS: ORAL_TABLET | ORAL | Status: DC

## 2015-01-11 NOTE — Progress Notes (Signed)
Safety precautions to be maintained throughout the outpatient stay will include: orient to surroundings, keep bed in low position, maintain call bell within reach at all times, provide assistance with transfer out of bed and ambulation.  

## 2015-01-11 NOTE — Patient Instructions (Addendum)
PLAN   Continue present medication Neurontin Cymbalta and oxycodone . No Metanx as discussed  F/U PCP Dr. Jeananne Rama  for evaliation of  BP and general medical  condition  F/U surgical evaluation Patient is without plans for surgical evaluation at this time he Discuss pain of knees with surgeon as we discussed  TENS unit for thoracic pain due to postherpetic neuralgia. Ask physical therapy to assist you with the operation of the TENS unit for your postherpetic neuralgia  F/U neurological evaluation. May consider pending follow-up evaluations  May consider radiofrequency rhizolysis or intraspinal procedures pending response to present treatment and F/U evaluation   Patient to call [facility] Center should patient have concerns prior to scheduled return appointment.

## 2015-01-11 NOTE — Progress Notes (Signed)
   Subjective:    Patient ID: Timothy Knapp, male    DOB: [birth year], 75 y.o.   MRN: [id number]  HPI  The patient is a 75 year old gentleman who returns to pain management for further evaluation and treatment of pain involving the thoracic region on the right felt to be due to postherpetic neuralgia. The patient has discontinued some of his medications. On today's visit we reviewed patient's medications and we'll have patient continue using Cymbalta and increase Cymbalta to 2 pills per day palpation continues Neurontin and oxycodone. We will also discussed the use of vitamin B which patient has discontinued due to the apparent development of a rash. We will remain available to consider interventional treatment and will consider additional modifications of treatment pending follow-up evaluation. The patient agreed to suggested treatment plan  Review of Systems     Objective:   Physical Exam  There was tenderness to palpation of the splenius capitis and occipitalis musculature region a mild degree with mild tenderness over the cervical facet cervical paraspinal musculature region. There was mild tenderness of the acromioclavicular and glenohumeral joint regions. Patient appeared to be with unremarkable Spurling's maneuver and appeared to be with bilaterally equal grip strength with Tinel and Phalen's maneuver reproducing minimal discomfort. Palpation over the thoracic facet thoracic paraspinal musculature region was attends to palpation of the right thoracic region with no crepitus of the thoracic region noted. Palpation of the right thoracic region reproduced predominant portion of patient's pain There was tenderness to palpation over the lumbar paraspinal must reason lumbar facet region of moderate degree with no increased pain with lateral bending rotation extension and palpation of the lumbar facets. Straight leg raising was tolerated to 30 without increased pain with dorsiflexion noted. There was  negative clonus negative Homans. Abdomen was nontender with no costovertebral tenderness noted.     Assessment & Plan:    Postherpetic neuralgia  Intercostal neuralgia  Degenerative joint disease of knees    PLAN   Continue present medication Neurontin Cymbalta and oxycodone . No Metanx as discussed  F/U PCP Dr. Jeananne Rama  for evaliation of  BP and general medical  condition  F/U surgical evaluation Patient is without plans for surgical evaluation at this time he Discuss pain of knees with surgeon as we discussed  TENS unit for thoracic pain due to postherpetic neuralgia. Ask physical therapy to assist you with the operation of the TENS unit for your postherpetic neuralgia  F/U neurological evaluation. May consider pending follow-up evaluations  May consider radiofrequency rhizolysis or intraspinal procedures pending response to present treatment and F/U evaluation   Patient to call Pain Management Center should patient have concerns prior to scheduled return appointment.

## 2015-01-11 NOTE — Telephone Encounter (Signed)
Left message for Timothy Knapp to call our office re; TENS unit.

## 2015-02-01 ENCOUNTER — Other Ambulatory Visit: Payer: Self-pay | Admitting: Pain Medicine

## 2015-02-10 ENCOUNTER — Ambulatory Visit: Payer: Medicare Other | Attending: Pain Medicine | Admitting: Pain Medicine

## 2015-02-10 ENCOUNTER — Encounter: Payer: Self-pay | Admitting: Pain Medicine

## 2015-02-10 VITALS — BP 126/64 | HR 57 | Temp 98.2°F | Resp 16 | Ht 68.5 in | Wt 152.0 lb

## 2015-02-10 DIAGNOSIS — G588 Other specified mononeuropathies: Secondary | ICD-10-CM | POA: Diagnosis not present

## 2015-02-10 DIAGNOSIS — M1711 Unilateral primary osteoarthritis, right knee: Secondary | ICD-10-CM | POA: Diagnosis not present

## 2015-02-10 DIAGNOSIS — M546 Pain in thoracic spine: Secondary | ICD-10-CM | POA: Diagnosis present

## 2015-02-10 DIAGNOSIS — Z5181 Encounter for therapeutic drug level monitoring: Secondary | ICD-10-CM | POA: Diagnosis not present

## 2015-02-10 DIAGNOSIS — G8929 Other chronic pain: Secondary | ICD-10-CM | POA: Diagnosis not present

## 2015-02-10 DIAGNOSIS — B0229 Other postherpetic nervous system involvement: Secondary | ICD-10-CM | POA: Diagnosis not present

## 2015-02-10 DIAGNOSIS — R0782 Intercostal pain: Secondary | ICD-10-CM | POA: Diagnosis not present

## 2015-02-10 DIAGNOSIS — F1190 Opioid use, unspecified, uncomplicated: Secondary | ICD-10-CM | POA: Diagnosis not present

## 2015-02-10 DIAGNOSIS — M170 Bilateral primary osteoarthritis of knee: Secondary | ICD-10-CM | POA: Diagnosis not present

## 2015-02-10 DIAGNOSIS — M5134 Other intervertebral disc degeneration, thoracic region: Secondary | ICD-10-CM

## 2015-02-10 DIAGNOSIS — M25561 Pain in right knee: Secondary | ICD-10-CM | POA: Diagnosis not present

## 2015-02-10 DIAGNOSIS — M7542 Impingement syndrome of left shoulder: Secondary | ICD-10-CM | POA: Diagnosis not present

## 2015-02-10 DIAGNOSIS — Z79899 Other long term (current) drug therapy: Secondary | ICD-10-CM | POA: Diagnosis not present

## 2015-02-10 DIAGNOSIS — M5416 Radiculopathy, lumbar region: Secondary | ICD-10-CM | POA: Diagnosis not present

## 2015-02-10 DIAGNOSIS — M47817 Spondylosis without myelopathy or radiculopathy, lumbosacral region: Secondary | ICD-10-CM | POA: Diagnosis not present

## 2015-02-10 MED ORDER — LIDOCAINE 5 % EX PTCH: MEDICATED_PATCH | CUTANEOUS | Status: DC

## 2015-02-10 MED ORDER — OXYCODONE HCL 5 MG PO TABS: ORAL_TABLET | ORAL | Status: DC

## 2015-02-10 NOTE — Progress Notes (Signed)
   Subjective:    Patient ID: Timothy Knapp, male    DOB: Sep 04, 1940, 75 y.o.   MRN: [id number]  HPI The patient is a 75 year old gentleman who returns to pain management  center for further evaluation and treatment of pain involving the region thoracic region on the right. The patient is with prior shingles and is with pain due to postherpetic neuralgia. The patient states that his pain is well-controlled at this time. The patient continues Neurontin oxycodone and vitamin B complex. We discussed patient's condition and decision was made to prescribe Lidoderm patch for patient. The patient is previously used Lidoderm patch. We will prescribe the Lidoderm patch at this time and will evaluate patient's response to Lidoderm patch which may be significant in terms of reducing the residual pain which patient is experiencing. The patient is able to wear his close and obtain restful sleep and perform activities of daily living without severe pain contributing to patient's symptomatology. The patient was understanding and agreed to suggested treatment plan     Review of Systems     Objective:   Physical Exam  There was tenderness to palpation of paraspinal misreading the cervical region cervical facet region of minimal degree there was minimal tenderness to palpation of the splenius capitis and occipitalis region. Palpation over the cervical facet cervical paraspinal musculature region was with minimal tenderness to palpation there was minimal tenderness to palpation of the acromial clavicular and glenohumeral joint region and patient was at unremarkable Spurling's maneuver. The patient appeared to be with no increased pain with Tinel and Phalen's maneuver and was with out every equal grip strength. Palpation of the thoracic facet thoracic paraspinal musculature region was attends to palpation of the thoracic region reproducing moderate discomfort. There was mild to moderate discomfort of the thoracic region  on the right in the midthoracic region. There was no definite increased pain with grasping skin between the digits. Area of the thoracic region was noted. Palpation over the lumbar paraspinal must reason lumbar facet region was with minimal discomfort. Straight leg raising was tolerated to 30 without increased pain with dorsiflexion noted. DTRs difficult to elicit patient had difficulty relaxing. There was negative clonus negative Homans. No sensory deficit or dermatomal dystrophy detected. Abdomen was nontender with no costovertebral tenderness noted.      Assessment & Plan:      Postherpetic neuralgia  Intercostal neuralgia  Degenerative joint disease of knees      PLAN   Continue present medication Neurontin vitamin B complex and oxycodone . Begin use of Lidoderm patch as prescribed  F/U PCP Dr. Jeananne Rama  for evaliation of  BP and general medical  condition  F/U surgical evaluation Patient is without plans for surgical evaluation at this time he Discuss pain of knees with surgeon as we discussed  TENS unit for thoracic pain due to postherpetic neuralgia. Ask physical therapy to assist you with the operation of the TENS unit for your postherpetic neuralgia  F/U neurological evaluation. May consider pending follow-up evaluations  May consider radiofrequency rhizolysis or intraspinal procedures pending response to present treatment and F/U evaluation   Patient to call Pain Management Center should patient have concerns prior to scheduled return appointment.

## 2015-02-10 NOTE — Progress Notes (Signed)
Patient here for medication management Safety precautions to be maintained throughout the outpatient stay will include: orient to surroundings, keep bed in low position, maintain call bell within reach at all times, provide assistance with transfer out of bed and ambulation.  

## 2015-02-10 NOTE — Patient Instructions (Addendum)
PLAN   Continue present medication Neurontin vitamin B complex and oxycodone . Begin use of Lidoderm patch as prescribed  F/U PCP Dr. Jeananne Rama  for evaliation of  BP and general medical  condition  F/U surgical evaluation Patient is without plans for surgical evaluation at this time he Discuss pain of knees with surgeon as we discussed  TENS unit for thoracic pain due to postherpetic neuralgia. Ask physical therapy to assist you with the operation of the TENS unit for your postherpetic neuralgia  F/U neurological evaluation. May consider pending follow-up evaluations  May consider radiofrequency rhizolysis or intraspinal procedures pending response to present treatment and F/U evaluation   Patient to call Pain Management Center should patient have concerns prior to scheduled return appointment.

## 2015-02-25 ENCOUNTER — Other Ambulatory Visit: Payer: Self-pay | Admitting: Pain Medicine

## 2015-02-28 DIAGNOSIS — M25561 Pain in right knee: Secondary | ICD-10-CM | POA: Diagnosis not present

## 2015-02-28 DIAGNOSIS — G8929 Other chronic pain: Secondary | ICD-10-CM | POA: Diagnosis not present

## 2015-02-28 DIAGNOSIS — M25562 Pain in left knee: Secondary | ICD-10-CM | POA: Diagnosis not present

## 2015-03-10 ENCOUNTER — Ambulatory Visit: Payer: Medicare Other | Attending: Pain Medicine | Admitting: Pain Medicine

## 2015-03-10 ENCOUNTER — Encounter: Payer: Self-pay | Admitting: Pain Medicine

## 2015-03-10 VITALS — BP 137/102 | HR 57 | Temp 97.6°F | Resp 16 | Ht 69.0 in | Wt 160.0 lb

## 2015-03-10 DIAGNOSIS — G588 Other specified mononeuropathies: Secondary | ICD-10-CM | POA: Diagnosis not present

## 2015-03-10 DIAGNOSIS — M5134 Other intervertebral disc degeneration, thoracic region: Secondary | ICD-10-CM

## 2015-03-10 DIAGNOSIS — M170 Bilateral primary osteoarthritis of knee: Secondary | ICD-10-CM | POA: Insufficient documentation

## 2015-03-10 DIAGNOSIS — M533 Sacrococcygeal disorders, not elsewhere classified: Secondary | ICD-10-CM | POA: Diagnosis not present

## 2015-03-10 DIAGNOSIS — B0229 Other postherpetic nervous system involvement: Secondary | ICD-10-CM | POA: Insufficient documentation

## 2015-03-10 DIAGNOSIS — M5416 Radiculopathy, lumbar region: Secondary | ICD-10-CM | POA: Diagnosis not present

## 2015-03-10 DIAGNOSIS — R0782 Intercostal pain: Secondary | ICD-10-CM | POA: Diagnosis not present

## 2015-03-10 DIAGNOSIS — M47817 Spondylosis without myelopathy or radiculopathy, lumbosacral region: Secondary | ICD-10-CM | POA: Diagnosis not present

## 2015-03-10 DIAGNOSIS — M546 Pain in thoracic spine: Secondary | ICD-10-CM | POA: Diagnosis present

## 2015-03-10 MED ORDER — LIDOCAINE 5 % EX PTCH: MEDICATED_PATCH | CUTANEOUS | Status: DC

## 2015-03-10 MED ORDER — GABAPENTIN 800 MG PO TABS: ORAL_TABLET | ORAL | Status: DC

## 2015-03-10 MED ORDER — OXYCODONE HCL 5 MG PO TABS: ORAL_TABLET | ORAL | Status: DC

## 2015-03-10 NOTE — Progress Notes (Signed)
Subjective:    Patient ID: Timothy Knapp, male    DOB: 07-25-1940, 75 y.o.   MRN: [id number]  HPI  The patient is a 75 year old gentleman who returns to pain management for further evaluation and treatment of pain involving the thoracic region on the right. The patient is with history of shingles with pain which had been severely incapacitating due to postherpetic neuralgia. At the present time patient appears to be with pain fairly well controlled. The patient continues medications consisting of Neurontin and Lidoderm patch oxycodone and vitamin B complex. Discussed patient's condition and noted patient's blood pressure to be significantly elevated. The patient explained that Dr. Jeananne Rama and he had been titrating his medications. The patient stated that his blood pressure had been too low at 1.. We will advise patient will follow-up with Dr. Jeananne Rama for readjustment of medications. The patient was very compliant and thumb is to follow-up with Dr. Jeananne Rama for reassessment of his antihypertensive medications and general medical condition. All agreed to suggested treatment plan. We will continue the present medications including Lidoderm patches which patient stated had been of significant benefit area we will continue present medications and avoid interventional treatment at this time. All agreed with suggested treatment plan  Review of Systems     Objective:   Physical Exam  There was tenderness to palpation of the splenius capitis and occipitalis musculature regions of mild degree. There was mild tenderness over the region of the splenius capitis and occipitalis musculature region on the left as well as on the right There was mild tinnitus to palpation over the cervical facet cervical paraspinal musculature region. Palpation over the acromioclavicular and glenohumeral joint regions reproduce mild discomfort. Palpation over the region of the thoracic area on the right was with no significant  tends to palpation. Grasping skin between the digits of the right thoracic region was without increased pain of significant degree. No new formed lesions of the skin of the thoracic region noted. No crepitus of the thoracic region was noted. Palpation over the lumbar paraspinal must reason lumbar facet region was attends to palpation of minimal degree. Lateral bending rotation extension and palpation of the lumbar facets reproduced minimal discomfort. Straight leg raising was tolerated to 30 without increase of pain with dorsiflexion noted. There was negative clonus negative Homans. Palpation of the PSIS and PII S region reproduces minimal discomfort. There was mild tenderness to palpation of the knees with negative anterior and posterior drawer signs without ballottement of the patella. There was crepitus of the knees noted. EHL strength appeared to be decreased without a definite sensory deficit of dermatomal dystrophy detected. There was negative clonus negative Homans. Abdomen was without excessive tends to palpation and no costovertebral tenderness was noted.      Assessment & Plan:      Postherpetic neuralgia  Intercostal neuralgia  Degenerative joint disease of knees    PLAN   Continue present medication Neurontin vitamin B complex Lidoderm patch and oxycodone .  F/U PCP Dr. Jeananne Rama  for evaliation of  BP and general medical  condition. Please see Dr. Jeananne Rama today for evaluation of blood pressure which is significantly elevated  F/U surgical evaluation Patient is without plans for surgical evaluation at this time  Discuss pain of knees with surgeon as we discussed  TENS unit for thoracic pain due to postherpetic neuralgia.   F/U neurological evaluation. May consider pending follow-up evaluations  May consider radiofrequency rhizolysis or intraspinal procedures pending response to present treatment and  F/U evaluation   Patient to call Pain Management Center should patient  have concerns prior to scheduled return appointment.

## 2015-03-10 NOTE — Patient Instructions (Signed)
PLAN   Continue present medication Neurontin vitamin B complex Lidoderm patch and oxycodone .  F/U PCP Dr. Jeananne Rama  for evaliation of  BP and general medical  condition. Please see Dr. Jeananne Rama today for evaluation of blood pressure which is significantly elevated  F/U surgical evaluation Patient is without plans for surgical evaluation at this time  Discuss pain of knees with surgeon as we discussed  TENS unit for thoracic pain due to postherpetic neuralgia.   F/U neurological evaluation. May consider pending follow-up evaluations  May consider radiofrequency rhizolysis or intraspinal procedures pending response to present treatment and F/U evaluation   Patient to call Pain Management Center should patient have concerns prior to scheduled return appointment.

## 2015-03-28 ENCOUNTER — Ambulatory Visit (INDEPENDENT_AMBULATORY_CARE_PROVIDER_SITE_OTHER): Payer: Medicare Other | Admitting: Family Medicine

## 2015-03-28 ENCOUNTER — Encounter: Payer: Self-pay | Admitting: Family Medicine

## 2015-03-28 VITALS — BP 121/70 | HR 54 | Temp 98.1°F | Ht 66.3 in | Wt 158.0 lb

## 2015-03-28 DIAGNOSIS — I10 Essential (primary) hypertension: Secondary | ICD-10-CM

## 2015-03-28 DIAGNOSIS — E785 Hyperlipidemia, unspecified: Secondary | ICD-10-CM | POA: Diagnosis not present

## 2015-03-28 LAB — LP+ALT+AST PICCOLO, WAIVED
ALT (SGPT) PICCOLO, WAIVED: 33 U/L (ref 10–47)
AST (SGOT) Piccolo, Waived: 66 U/L — ABNORMAL HIGH (ref 11–38)
CHOL/HDL RATIO PICCOLO,WAIVE: 4.7 mg/dL
Cholesterol Piccolo, Waived: 200 mg/dL — ABNORMAL HIGH (ref <200)
HDL CHOL PICCOLO, WAIVED: 43 mg/dL — AB (ref >59)
LDL CHOL CALC PICCOLO WAIVED: 117 mg/dL — AB (ref <100)
TRIGLYCERIDES PICCOLO,WAIVED: 199 mg/dL — AB (ref <150)
VLDL CHOL CALC PICCOLO,WAIVE: 40 mg/dL — AB (ref <30)

## 2015-03-28 MED ORDER — ATORVASTATIN CALCIUM 40 MG PO TABS: ORAL_TABLET | ORAL | Status: DC

## 2015-03-28 NOTE — Progress Notes (Signed)
BP 121/70 mmHg  Pulse 54  Temp(Src) 98.1 F (36.7 C)  Ht 5' 6.3" (1.684 m)  Wt 158 lb (71.668 kg)  BMI 25.27 kg/m2  SpO2 97%   Subjective:    Patient ID: Timothy Knapp, male    DOB: Dec 20, 1940, 75 y.o.   MRN: [id number]  HPI: Timothy Knapp is a 75 y.o. male  Chief Complaint  Patient presents with  . Hypertension  . Hyperlipidemia   patient doing well with no complaints from medications taking Benzapril faithfully Lipitor occasionally. Taking a lot of supplements Taking vitamin B supplements for postherpetic neuralgia managed by Dr. Primus Bravo    Relevant past medical, surgical, family and social history reviewed and updated as indicated. Interim medical history since our last visit reviewed. Allergies and medications reviewed and updated.  Review of Systems  Constitutional: Negative.   Respiratory: Negative.   Cardiovascular: Negative.   Gastrointestinal: Negative.   Musculoskeletal: Negative.   Psychiatric/Behavioral: Negative.     Per HPI unless specifically indicated above     Objective:    BP 121/70 mmHg  Pulse 54  Temp(Src) 98.1 F (36.7 C)  Ht 5' 6.3" (1.684 m)  Wt 158 lb (71.668 kg)  BMI 25.27 kg/m2  SpO2 97%  Wt Readings from Last 3 Encounters:  03/28/15 158 lb (71.668 kg)  03/10/15 160 lb (72.576 kg)  02/10/15 152 lb (68.947 kg)    Physical Exam  Constitutional: He is oriented to person, place, and time. He appears well-developed and well-nourished. No distress.  HENT:  Head: Normocephalic and atraumatic.  Right Ear: Hearing normal.  Left Ear: Hearing normal.  Nose: Nose normal.  Eyes: Conjunctivae and lids are normal. Right eye exhibits no discharge. Left eye exhibits no discharge. No scleral icterus.  Cardiovascular: Normal rate, regular rhythm and normal heart sounds.   Pulmonary/Chest: Effort normal and breath sounds normal. No respiratory distress.  Musculoskeletal: Normal range of motion.  Neurological: He is alert and oriented to  person, place, and time.  Skin: Skin is intact. No rash noted.  Psychiatric: He has a normal mood and affect. His speech is normal and behavior is normal. Judgment and thought content normal. Cognition and memory are normal.    Results for orders placed or performed in visit on 03/28/15  LP+ALT+AST Piccolo, [employer]  Result Value Ref Range   ALT (SGPT) Piccolo, Waived 33 10 - 47 U/L   AST (SGOT) Piccolo, Waived 66 (H) 11 - 38 U/L   Cholesterol Piccolo, Waived 200 (H) <200 mg/dL   HDL Chol Piccolo, Waived 43 (L) >59 mg/dL   Triglycerides Piccolo,Waived 199 (H) <150 mg/dL   Chol/HDL Ratio Piccolo,Waive 4.7 mg/dL   LDL Chol Calc Piccolo Waived 117 (H) <100 mg/dL   VLDL Chol Calc Piccolo,Waive 40 (H) <30 mg/dL      Assessment & Plan:   Problem List Items Addressed This Visit      Cardiovascular and Mediastinum   Hypertension    The current medical regimen is effective;  continue present plan and medications.       Relevant Medications   atorvastatin (LIPITOR) 40 MG tablet     Other   Hyperlipidemia    Discuss hyperlipidemia with elevated liver enzymes patient will cut back on supplements continuing his vitamin B supplements Start  Taking cholesterol medicine faithfully      Relevant Medications   atorvastatin (LIPITOR) 40 MG tablet   Other Relevant Orders   LP+ALT+AST Piccolo, Waived (Completed)   Basic metabolic panel  Other Visit Diagnoses    Essential hypertension, benign    -  Primary    Relevant Medications    atorvastatin (LIPITOR) 40 MG tablet    Other Relevant Orders    LP+ALT+AST Piccolo, Waived (Completed)    Basic metabolic panel        Follow up plan: Return in about 2 months (around 05/28/2015) for lipids, alt, ast, and hepatitis C.

## 2015-03-28 NOTE — Assessment & Plan Note (Signed)
Discuss hyperlipidemia with elevated liver enzymes patient will cut back on supplements continuing his vitamin B supplements Start  Taking cholesterol medicine faithfully

## 2015-03-28 NOTE — Assessment & Plan Note (Signed)
The current medical regimen is effective;  continue present plan and medications.  

## 2015-03-29 ENCOUNTER — Encounter: Payer: Self-pay | Admitting: Family Medicine

## 2015-03-29 LAB — BASIC METABOLIC PANEL
BUN/Creatinine Ratio: 19 (ref 10–22)
BUN: 20 mg/dL (ref 8–27)
CALCIUM: 9.1 mg/dL (ref 8.6–10.2)
CHLORIDE: 100 mmol/L (ref 96–106)
CO2: 27 mmol/L (ref 18–29)
Creatinine, Ser: 1.04 mg/dL (ref 0.76–1.27)
GFR calc Af Amer: 81 mL/min/{1.73_m2} (ref >59)
GFR calc non Af Amer: 70 mL/min/{1.73_m2} (ref >59)
Glucose: 97 mg/dL (ref 65–99)
POTASSIUM: 5.1 mmol/L (ref 3.5–5.2)
Sodium: 139 mmol/L (ref 134–144)

## 2015-04-07 ENCOUNTER — Ambulatory Visit: Payer: Medicare Other | Attending: Pain Medicine | Admitting: Pain Medicine

## 2015-04-07 ENCOUNTER — Telehealth: Payer: Self-pay | Admitting: Family Medicine

## 2015-04-07 ENCOUNTER — Encounter: Payer: Self-pay | Admitting: Pain Medicine

## 2015-04-07 VITALS — BP 117/56 | HR 56 | Temp 96.7°F | Resp 16 | Ht 68.0 in | Wt 155.0 lb

## 2015-04-07 DIAGNOSIS — B0229 Other postherpetic nervous system involvement: Secondary | ICD-10-CM | POA: Diagnosis not present

## 2015-04-07 DIAGNOSIS — M47817 Spondylosis without myelopathy or radiculopathy, lumbosacral region: Secondary | ICD-10-CM | POA: Diagnosis not present

## 2015-04-07 DIAGNOSIS — M170 Bilateral primary osteoarthritis of knee: Secondary | ICD-10-CM | POA: Insufficient documentation

## 2015-04-07 DIAGNOSIS — M546 Pain in thoracic spine: Secondary | ICD-10-CM | POA: Diagnosis present

## 2015-04-07 DIAGNOSIS — M533 Sacrococcygeal disorders, not elsewhere classified: Secondary | ICD-10-CM | POA: Diagnosis not present

## 2015-04-07 DIAGNOSIS — R0782 Intercostal pain: Secondary | ICD-10-CM | POA: Diagnosis not present

## 2015-04-07 DIAGNOSIS — M5416 Radiculopathy, lumbar region: Secondary | ICD-10-CM | POA: Diagnosis not present

## 2015-04-07 DIAGNOSIS — G588 Other specified mononeuropathies: Secondary | ICD-10-CM | POA: Diagnosis not present

## 2015-04-07 DIAGNOSIS — M5134 Other intervertebral disc degeneration, thoracic region: Secondary | ICD-10-CM

## 2015-04-07 MED ORDER — DULOXETINE HCL 30 MG PO CPEP: ORAL_CAPSULE | ORAL | Status: DC

## 2015-04-07 MED ORDER — OXYCODONE HCL 5 MG PO TABS: ORAL_TABLET | ORAL | Status: DC

## 2015-04-07 MED ORDER — AMITRIPTYLINE HCL 10 MG PO TABS
ORAL_TABLET | ORAL | Status: DC
Start: 2015-04-07 — End: 2015-05-05

## 2015-04-07 NOTE — Patient Instructions (Addendum)
PLAN   Continue present medication Neurontin vitamin B complex Lidoderm patch and oxycodone . Resume Cymbalta and Begin Elavil  ONLY IF Dr. Jeananne Rama agrees with beginning Elavil . CAUTION Elavil can cause excessive sedation, respiratory depression, confusion, and other side effects. Exercise extreme caution when taking this medication and stay in the presence of an adult drive her who can observe you and take you to emergency room if you develop any of the side effects  Intercostal nerve blocks to be performed at time of return appointment  F/U PCP Dr. Jeananne Rama  for evaliation of  BP and general medical  condition. Please discuss beginning Elavil with Dr. Jeananne Rama before you get the prescription for Elavil  F/U surgical evaluation Patient is without plans for surgical evaluation at this time  Discuss pain of knees with surgeon as we previously discussed  TENS unit for thoracic pain due to postherpetic neuralgia.   F/U neurological evaluation. May consider pending follow-up evaluations  May consider radiofrequency rhizolysis or intraspinal procedures pending response to present treatment and F/U evaluation   Patient to call Pain Management Center should patient have concerns prior to scheduled return appointment.Pain Management Discharge Instructions  General Discharge Instructions :  If you need to reach your doctor call: Monday-Friday 8:00 am - 4:00 pm at [phone] or toll free [phone].  After clinic hours [phone] to have operator reach doctor.  Bring all of your medication bottles to all your appointments in the pain clinic.  To cancel or reschedule your appointment with Pain Management please remember to call 24 hours in advance to avoid a fee.  Refer to the educational materials which you have been given on: General Risks, I had my Procedure. Discharge Instructions, Post Sedation.  Post Procedure Instructions:  The drugs you were given will stay in your system until  tomorrow, so for the next 24 hours you should not drive, make any legal decisions or drink any alcoholic beverages.  You may eat anything you prefer, but it is better to start with liquids then soups and crackers, and gradually work up to solid foods.  Please notify your doctor immediately if you have any unusual bleeding, trouble breathing or pain that is not related to your normal pain.  Depending on the type of procedure that was done, some parts of your body may feel week and/or numb.  This usually clears up by tonight or the next day.  Walk with the use of an assistive device or accompanied by an adult for the 24 hours.  You may use ice on the affected area for the first 24 hours.  Put ice in a Ziploc bag and cover with a towel and place against area 15 minutes on 15 minutes off.  You may switch to heat after 24 hours.Intercostal Nerve Block Patient Information  Description: The twelve intercostal nerves arise from the first thru twelfth thoracic nerve roots.  The nerve begins at the spine and wraps around the body, lying in a groove underneath each rib.  Each intercostal nerve innervates a specific strip of skin and body walk of the abdomen and chest.  Therefore, injuries of the chest wall or abdominal wall result in pain that is transmitted back to the brian via the intercostal nerves.  Examples of such injuries include rib fractures and incisions for lung and gall bladder surgery.  Occasionally, pain may persist long after an injury or surgical incision secondary to inflammation and irritation of the intercostal nerve.  The longstanding pain is known  as intercostal neuralgia.  An intercostal nerve block is preformed to eliminate pain either temporarily or permanently.  A small needle is placed below the rib and local anesthetic (like Novocaine) and possibly steroid is injected.  Usually 2-4 intercostal nerves are blocked at a time depending on the problem.  The patient will experience a slight  "pin-prick" sensation for each injection.  Shortly thereafter, the strip of skin that is innervated by the blocked intercostal nerve will feel numb.  Persistent pain that is only temporarily relieved with local anesthetic may require a more permanent block. This procedure is called Cryoneurolysis and entails placing a small probe beneath the rib to freeze the nerve.  Conditions that may be treated by intercostal nerve blocks:   Rib fractures  Longstanding pain from surgery of the chest or abdomen (intercostal neuralgia)  Pain from chest tubes  Pain from trauma to the chest  Preparation for the injections:  1. Do not eat any solid food or dairy products within 8 hours of your appointment. 2. You may drink clear liquids up to 3 hours before appointment.  Clear liquids include water, black coffee, juice or soda.  No milk or cream please. 3. You may take your regular medication, including pain medications, with a sip of water before your appointment.  Diabetics should hold regular insulin (if take separately) and take 1/2 normal NPH dose the morning of the procedure.   Carry some sugar containing items with you to your appointment. 4. A driver must accompany you and be prepared to drive you home after your procedure. 5. Bring all your current medications with you. 6. An IV may be inserted and sedation may be given at the discretion of the physician. 7. A blood pressure cuff, EKG and other monitors will often be applied during the procedure.  Some patients may need to have extra oxygen administered for a short period. 8. You will be asked to provide medical information, including your allergies, prior to the procedure.  We must know immediately if you are taking blood thinners (like Coumadin/Warfarin) or if you are allergic to IV iodine contrast (dye). We must know if you could possible be pregnant.  Possible side-effects:   Bleeding from needle site  Infection (rare)  Nerve injury  (rare)  Numbness & tingling of skin  Collapsed lung requiring chest tube (rare)  Local anesthetic toxicity (rare)  Light-headedness (temporary)  Pain at injection site (several days)  Decreased blood pressure (temporary)  Shortness of breath  Jittery/shaking sensation (temporary)  Call if you experience:   Difficulty breathing or hives (go directly to the emergency room)  Redness, inflammation or drainage at the injection site  Severe pain at the site of the injection  Any new symptoms which are concerning   Please note:  Your pain may subside immediately but may return several hours after the injection.  Often, more than one injection is required to reduce the pain. Also, if several temporary blocks with local anesthetic are ineffective, a more permanent block with cryolysis may be necessary.  This will be discussed with you should this be the case.  If you have any questions, please call [phone] [facility]

## 2015-04-07 NOTE — Telephone Encounter (Signed)
Returned patient's call, explained that Dr. Primus Bravo would send paperwork with details and MAC will review and fax back.

## 2015-04-07 NOTE — Progress Notes (Signed)
Safety precautions to be maintained throughout the outpatient stay will include: orient to surroundings, keep bed in low position, maintain call bell within reach at all times, provide assistance with transfer out of bed and ambulation.  

## 2015-04-07 NOTE — Telephone Encounter (Signed)
Patient called stating that  Dr. Primus Bravo needs Dr. Jeananne Rama approval to prescribe patient with a new rx with is elavil 10 mg. Please call patient back with answer. [phone] best number to call back.

## 2015-04-07 NOTE — Progress Notes (Signed)
Subjective:    Patient ID: Timothy Knapp, male    DOB: 04/10/1940, 75 y.o.    MRN: [id number]  HPI    The patient is a 75 year old gentleman who returns to pain management for further evaluation and treatment of pain involving the thoracic region on the right which has been felt to be due to postherpetic neuralgia. The patient has had improvement of his pain and continues to have some persistent pain. We discussed further evaluation of patient and decision has been made to proceed with intercostal nerve block at time return appointment as well as to help patient resume the use of Cymbalta and to consider beginning the use of a small dose of Elavil at bedtime. The patient awakens in the morning with significant pain and the Elavil may help reduce the pain at this time of day. We will continue oxycodone and Neurontin and we have emphasized to patient to exercise extreme caution to avoid respiratory depression, excessive sedation, confusion, and other side effects. All agreed to suggested treatment plan.   Review of Systems     Objective:   Physical Exam  There was tenderness of the splenius capitis and occipitalis musculature regions of mild degree with mild tenderness of the cervical facet cervical paraspinal musculature region. There was tenderness of the acromioclavicular and glenohumeral joint regions of minimal degree. The patient was with bilaterally equal grip strength and Tinel and Phalen's maneuver were without increase of pain of significant degree. Palpation of the thoracic region was a tennis to palpation on the right with increased pain with grasping skin between the digits and with palpation of the thoracic region on the right in the midthoracic region. There was no crepitus of the thoracic region noted. Palpation over the lumbar paraspinal musculature region lumbar facet region was attends to palpation along mild degree. Palpation over the PSIS and PII S region was with minimal  increase of pain. There was no significant tenderness to palpation of the greater trochanteric region iliotibial band region Straight leg raising was tolerated to 30 without increase of pain with dorsiflexion noted. The knees were with tenderness to palpation with negative anterior and posterior drawer signs and without ballottement of the patella. There was negative clonus negative Homans. There was tenderness to palpation of the mid abdominal region on the right. This region was in the distribution of patient's prior shingles. No costovertebral tenderness was noted.      Assessment & Plan:   Postherpetic neuralgia  Intercostal neuralgia  Degenerative joint disease of knees       PLAN   Continue present medication Neurontin vitamin B complex Lidoderm patch and oxycodone . Resume Cymbalta and Begin Elavil  ONLY IF Dr. Jeananne Rama agrees with beginning Elavil . CAUTION Elavil can cause excessive sedation, respiratory depression, confusion, and other side effects. Exercise extreme caution when taking this medication and stay in the presence of an adult drive her who can observe you and take you to emergency room if you develop any of the side effects  Intercostal nerve blocks to be performed at time of return appointment  F/U PCP Dr. Jeananne Rama  for evaliation of  BP and general medical  condition. Please discuss beginning Elavil with Dr. Jeananne Rama before you get the prescription for Elavil  F/U surgical evaluation Patient is without plans for surgical evaluation at this time  Discuss pain of knees with surgeon as we previously discussed  TENS unit for thoracic pain due to postherpetic neuralgia.   F/U neurological evaluation. May  consider pending follow-up evaluations  May consider radiofrequency rhizolysis or intraspinal procedures pending response to present treatment and F/U evaluation   Patient to call Pain Management Center should patient have concerns prior to scheduled return  appointment.

## 2015-04-14 ENCOUNTER — Other Ambulatory Visit: Payer: Self-pay | Admitting: Pain Medicine

## 2015-04-20 ENCOUNTER — Ambulatory Visit: Payer: Medicare Other | Attending: Pain Medicine | Admitting: Pain Medicine

## 2015-04-20 ENCOUNTER — Encounter: Payer: Self-pay | Admitting: Pain Medicine

## 2015-04-20 VITALS — BP 128/73 | HR 54 | Temp 96.8°F | Resp 12 | Ht 68.0 in | Wt 155.0 lb

## 2015-04-20 DIAGNOSIS — M546 Pain in thoracic spine: Secondary | ICD-10-CM | POA: Insufficient documentation

## 2015-04-20 DIAGNOSIS — R0782 Intercostal pain: Secondary | ICD-10-CM | POA: Diagnosis not present

## 2015-04-20 DIAGNOSIS — Z8619 Personal history of other infectious and parasitic diseases: Secondary | ICD-10-CM | POA: Diagnosis not present

## 2015-04-20 DIAGNOSIS — M5134 Other intervertebral disc degeneration, thoracic region: Secondary | ICD-10-CM

## 2015-04-20 DIAGNOSIS — B0229 Other postherpetic nervous system involvement: Secondary | ICD-10-CM

## 2015-04-20 MED ORDER — ORPHENADRINE CITRATE 30 MG/ML IJ SOLN
INTRAMUSCULAR | Status: AC
  Administered 2015-04-20: 60 mg via INTRAMUSCULAR
  Filled 2015-04-20: qty 2

## 2015-04-20 MED ORDER — BUPIVACAINE HCL (PF) 0.5 % IJ SOLN
INTRAMUSCULAR | Status: AC
  Administered 2015-04-20: 30 mL
  Filled 2015-04-20: qty 30

## 2015-04-20 MED ORDER — FENTANYL CITRATE (PF) 100 MCG/2ML IJ SOLN
INTRAMUSCULAR | Status: AC
  Administered 2015-04-20: 50 ug via INTRAVENOUS
  Filled 2015-04-20: qty 2

## 2015-04-20 MED ORDER — FENTANYL CITRATE (PF) 100 MCG/2ML IJ SOLN
100.0000 ug | Freq: Once | INTRAMUSCULAR | Status: AC
  Administered 2015-04-20: 50 ug via INTRAVENOUS

## 2015-04-20 MED ORDER — MIDAZOLAM HCL 5 MG/5ML IJ SOLN
INTRAMUSCULAR | Status: AC
  Administered 2015-04-20: 2 mg via INTRAVENOUS
  Filled 2015-04-20: qty 5

## 2015-04-20 MED ORDER — LACTATED RINGERS IV SOLN: 1000.0000 mL | INTRAVENOUS | Status: DC

## 2015-04-20 MED ORDER — ORPHENADRINE CITRATE 30 MG/ML IJ SOLN
60.0000 mg | Freq: Once | INTRAMUSCULAR | Status: AC
  Administered 2015-04-20: 60 mg via INTRAMUSCULAR

## 2015-04-20 MED ORDER — BUPIVACAINE HCL (PF) 0.5 % IJ SOLN
30.0000 mL | Freq: Once | INTRAMUSCULAR | Status: AC
  Administered 2015-04-20: 30 mL

## 2015-04-20 MED ORDER — MIDAZOLAM HCL 5 MG/5ML IJ SOLN
5.0000 mg | Freq: Once | INTRAMUSCULAR | Status: AC
  Administered 2015-04-20: 2 mg via INTRAVENOUS

## 2015-04-20 MED ORDER — TRIAMCINOLONE ACETONIDE 40 MG/ML IJ SUSP
INTRAMUSCULAR | Status: AC
Start: 2015-04-20 — End: 2015-04-20
  Administered 2015-04-20: 40 mg
  Filled 2015-04-20: qty 1

## 2015-04-20 MED ORDER — TRIAMCINOLONE ACETONIDE 40 MG/ML IJ SUSP
40.0000 mg | Freq: Once | INTRAMUSCULAR | Status: AC
  Administered 2015-04-20: 40 mg

## 2015-04-20 NOTE — Progress Notes (Signed)
Right mid back and mid back has a raised reddened welt. Patient denies any pain/irritation/itchiness. Dr. Primus Bravo made aware and did observe the areas. No new orders at this time.

## 2015-04-20 NOTE — Patient Instructions (Addendum)
PLAN   Continue present medication Neurontin vitamin B complex Lidoderm patch Cymbalta Elavil and oxycodone and Begin Elavil   F/U PCP Dr. Jeananne Rama  for evaliation of  BP and general medical  condition. Please discuss beginning Elavil with Dr. Jeananne Rama before you get the prescription for Elavil  F/U surgical evaluation Patient is without plans for surgical evaluation at this time  Discuss pain of knees with surgeon as we previously discussed  TENS unit for thoracic pain due to postherpetic neuralgia.   F/U neurological evaluation. May consider pending follow-up evaluations  May consider radiofrequency rhizolysis or intraspinal procedures pending response to present treatment and F/U evaluation   Patient to call Pain Management Center should patient have concerns prior to scheduled return appointment.  Pain Management Discharge Instructions  General Discharge Instructions :  If you need to reach your doctor call: Monday-Friday 8:00 am - 4:00 pm at [phone] or toll free [phone].  After clinic hours [phone] to have operator reach doctor.  Bring all of your medication bottles to all your appointments in the pain clinic.  To cancel or reschedule your appointment with Pain Management please remember to call 24 hours in advance to avoid a fee.  Refer to the educational materials which you have been given on: General Risks, I had my Procedure. Discharge Instructions, Post Sedation.  Post Procedure Instructions:  The drugs you were given will stay in your system until tomorrow, so for the next 24 hours you should not drive, make any legal decisions or drink any alcoholic beverages.  You may eat anything you prefer, but it is better to start with liquids then soups and crackers, and gradually work up to solid foods.  Please notify your doctor immediately if you have any unusual bleeding, trouble breathing or pain that is not related to your normal pain.  Depending on the  type of procedure that was done, some parts of your body may feel week and/or numb.  This usually clears up by tonight or the next day.  Walk with the use of an assistive device or accompanied by an adult for the 24 hours.  You may use ice on the affected area for the first 24 hours.  Put ice in a Ziploc bag and cover with a towel and place against area 15 minutes on 15 minutes off.  You may switch to heat after 24 hours.

## 2015-04-20 NOTE — Progress Notes (Signed)
Patient here for procedure d/t lower back Safety precautions to be maintained throughout the outpatient stay will include: orient to surroundings, keep bed in low position, maintain call bell within reach at all times, provide assistance with transfer out of bed and ambulation.

## 2015-04-20 NOTE — Progress Notes (Signed)
   Subjective:    Patient ID: Timothy Knapp, male    DOB: 01-25-1940, 75 y.o.   MRN: [id number]  HPI  PROCEDURE PERFORMED:  Intercostal nerve block.  HISTORY OF PRESENT ILLNESS:  The patient is a 75 y.o. male who returns to the [facility] for further evaluation and treatment of pain involving the midportion of the back.The patient is with prior history of shingles involving the thoracic region. The patient's pain of the thoracic region is felt to be due to postherpetic neuralgia. Decision has been made to proceed with intercostal nerve blocks in attempt to decrease severity of patient's symptoms, minimize progression of patient's symptoms, and avoid the need for more involved treatment. There is concern regarding the patient's pain being due to significant component of intercostal neuralgia. The risks, benefits, and expectations of the procedure have been discussed and explained to the patient who was understanding and in agreement with suggested treatment plan. We will proceed with interventional treatment as discussed.   DESCRIPTION OF PROCEDURE: Intercostal nerve block with IV Versed, IV fentanyl conscious sedation, EKG, blood pressure, pulse, and pulse oximetry monitoring. The procedure was performed with the patient in the prone position under fluoroscopic guidance.   Intercostal nerve block, right side: With the patient in the prone position, Betadine prep of proposed entry site was performed under fluoroscopic guidance with AP view of the thoracic spine. Under fluoroscopic guidance, a 22 -gauge needle was inserted to contact bone of the 12th rib on the right side after which the needle was repositioned at the inferior border of the 12th rib on the right side under fluoroscopic guidance. Following documentation of needle placement, at the inferior border of the 12th rib on the right side and negative aspiration, a total of 3 mL of 0.50% bupivacaine with Kenalog was injected for right  side for 12th rib intercostal nerve block.   INTERCOSTAL NERVE BLOCKS AT T11, T10, T9, T8, and T7 LEVELS: The procedure was performed at these levels as was performed at the previous level, T12, utilizing the same technique and under fluoroscopic guidance  Myoneural block injection of the thoracic region Following Betadine prep of proposed entry site a 22-gauge needle was inserted in the thoracic musculature region and following negative aspiration 2 cc of 0.5% bupivacaine with Norflex was injected for myoneural block injection of the thoracic region times two.  The patient tolerated procedure well  A total of 10 mg Kenalog was utilized for the procedure.   PLAN:   1. Medications: We will continue presently prescribed medications Neurontin Cymbalta Elavil Lidoderm patch vitamin B complex and oxycodone 2. The patient is to follow up with primary care physician Dr. Jeananne Rama for further evaluation of blood pressure and general medical condition as discussed. 3. Surgical evaluation. Has been addressed 4. Neurological evaluation. Has been addressed 5. May consider the patient for additional studies pending response to treatment and follow-up evaluation. 6. May consider radiofrequency procedures, implantation type procedures and other treatment pending response to treatment and follow-up evaluation. 7. The patient has been advised to adhere to proper body mechanics and to call the Pain Management Center prior to scheduled return appointment should there be significant change in condition or have other concerns regarding condition prior to scheduled return appointment.  The patient is understanding and in agreement with suggested treatment plan.    Review of Systems     Objective:   Physical Exam        Assessment & Plan:

## 2015-04-21 ENCOUNTER — Telehealth: Payer: Self-pay | Admitting: *Deleted

## 2015-04-21 NOTE — Telephone Encounter (Signed)
Message left

## 2015-04-26 ENCOUNTER — Other Ambulatory Visit: Payer: Self-pay | Admitting: Pain Medicine

## 2015-04-28 ENCOUNTER — Other Ambulatory Visit: Payer: Self-pay | Admitting: Pain Medicine

## 2015-05-05 ENCOUNTER — Encounter: Payer: Self-pay | Admitting: Pain Medicine

## 2015-05-05 ENCOUNTER — Ambulatory Visit: Payer: Medicare Other | Attending: Pain Medicine | Admitting: Pain Medicine

## 2015-05-05 VITALS — BP 102/79 | HR 54 | Temp 98.2°F | Resp 16 | Ht 68.0 in | Wt 153.0 lb

## 2015-05-05 DIAGNOSIS — R0782 Intercostal pain: Secondary | ICD-10-CM | POA: Diagnosis not present

## 2015-05-05 DIAGNOSIS — M5134 Other intervertebral disc degeneration, thoracic region: Secondary | ICD-10-CM

## 2015-05-05 DIAGNOSIS — B0229 Other postherpetic nervous system involvement: Secondary | ICD-10-CM

## 2015-05-05 DIAGNOSIS — M5416 Radiculopathy, lumbar region: Secondary | ICD-10-CM | POA: Diagnosis not present

## 2015-05-05 DIAGNOSIS — M533 Sacrococcygeal disorders, not elsewhere classified: Secondary | ICD-10-CM | POA: Diagnosis not present

## 2015-05-05 DIAGNOSIS — M47817 Spondylosis without myelopathy or radiculopathy, lumbosacral region: Secondary | ICD-10-CM | POA: Diagnosis not present

## 2015-05-05 MED ORDER — AMITRIPTYLINE HCL 10 MG PO TABS: ORAL_TABLET | ORAL | Status: DC

## 2015-05-05 MED ORDER — L-METHYLFOLATE-B6-B12 3-35-2 MG PO TABS: ORAL_TABLET | ORAL | Status: DC

## 2015-05-05 NOTE — Patient Instructions (Addendum)
PLAN   Continue present medication Neurontin Cymbalta Elavil vitamin B complex Lidoderm patch and oxycodone .   F/U PCP Dr. Jeananne Rama  for evaliation of  BP and general medical  condition. Please discuss beginning Elavil with Dr. Jeananne Rama before you get the prescription for Elavil  F/U surgical evaluation Patient is without plans for surgical evaluation at this time  Discuss pain of knees with surgeon as we previously discussed  TENS unit for thoracic pain due to postherpetic neuralgia.   F/U neurological evaluation. May consider pending follow-up evaluations  May consider radiofrequency rhizolysis or intraspinal procedures pending response to present treatment and F/U evaluation   Patient to call Pain Management Center should patient have concerns prior to scheduled return appointment.

## 2015-05-05 NOTE — Progress Notes (Signed)
Safety precautions to be maintained throughout the outpatient stay will include: orient to surroundings, keep bed in low position, maintain call bell within reach at all times, provide assistance with transfer out of bed and ambulation.  

## 2015-05-05 NOTE — Progress Notes (Signed)
   Subjective:    Patient ID: Timothy Knapp, male    DOB: [birth year], 75 y.o.   MRN: [id number]  HPI  The patient is a 75 year old gentleman who returns to pain management for further evaluation and treatment of pain involving the right thoracic region with pain felt to be due to postherpetic neuralgia. The patient states that he is doing very well at this time. The patient denies any pain of the thoracic region of significant degree and is able to wear clothes and able to participate conditions sports and other activities without experiencing severe disabling pain. The patient also is having any significant pain of the knees. The patient stated that he begin to exercise with strengthening of the muscles of the lower extremity and that his knee pain is well-controlled at this time as well. The patient will continue medications consisting of Neurontin Cymbalta Elavil vitamin B complex Lidoderm patch and oxycodone. We will remain available to consider patient for interventional treatment such as intercostal nerve blocks and other treatments should there be any return of significant pain management significant degree. The patient was understanding and in agreement with suggested treatment plan. We will continue presently prescribed medications and will remain available to consider modification of treatment regimen including interventional treatment. All agreed to suggested treatment plan.     Review of Systems     Objective:   Physical Exam   There was tenderness to palpation of the paraspinal musculature and cervical region cervical facet region palpation which be produced pain of minimal degree with minimal tenderness over the splenius capitis and occipitalis musculature regions. There was minimal tenderness over the region of the acromioclavicular and glenohumeral joint regions. The patient appeared to be with unremarkable Spurling's maneuver The patient appeared to be with [place] bilaterally equal  grip strength.. Tinel and Phalen's maneuver were without increase of pain of significant degree. Palpation over the region of the thoracic region thoracic facet region was attends to palpation of the mid thoracic region on the right. There was tenderness to palpation over the lumbar paraspinal musculatures and lumbar facet region of minimal degree. No crepitus of the thoracic region noted. Palpation over the lumbar paraspinal musculatures and lumbar facet region was with out significant increase of pain and there was minimal tenderness of the PSIS and PII S region as well as the greater trochanteric region iliotibial band region. There was no sensory deficit or dermatomal distribution detected. There was negative clonus negative Homans.     Assessment & Plan:     Postherpetic neuralgia  Intercostal neuralgia  Degenerative joint disease of knees     PLAN   Continue present medication Neurontin Cymbalta Elavil vitamin B complex Lidoderm patch and oxycodone .   F/U PCP Dr. Jeananne Rama  for evaliation of  BP and general medical  condition. Please discuss beginning Elavil with Dr. Jeananne Rama before you get the prescription for Elavil  F/U surgical evaluation Patient is without plans for surgical evaluation at this time  Discuss pain of knees with surgeon as we previously discussed  TENS unit for thoracic pain due to postherpetic neuralgia.   F/U neurological evaluation. May consider pending follow-up evaluations  May consider radiofrequency rhizolysis or intraspinal procedures pending response to present treatment and F/U evaluation   Patient to call Pain Management Center should patient have concerns prior to scheduled return appointment.

## 2015-05-30 ENCOUNTER — Ambulatory Visit (INDEPENDENT_AMBULATORY_CARE_PROVIDER_SITE_OTHER): Payer: Medicare Other | Admitting: Family Medicine

## 2015-05-30 ENCOUNTER — Encounter: Payer: Self-pay | Admitting: Family Medicine

## 2015-05-30 VITALS — BP 120/67 | HR 56 | Temp 97.6°F | Ht 68.0 in | Wt 160.0 lb

## 2015-05-30 DIAGNOSIS — Z0000 Encounter for general adult medical examination without abnormal findings: Secondary | ICD-10-CM

## 2015-05-30 DIAGNOSIS — R748 Abnormal levels of other serum enzymes: Secondary | ICD-10-CM

## 2015-05-30 DIAGNOSIS — E785 Hyperlipidemia, unspecified: Secondary | ICD-10-CM | POA: Diagnosis not present

## 2015-05-30 DIAGNOSIS — I10 Essential (primary) hypertension: Secondary | ICD-10-CM | POA: Diagnosis not present

## 2015-05-30 DIAGNOSIS — B0229 Other postherpetic nervous system involvement: Secondary | ICD-10-CM | POA: Diagnosis not present

## 2015-05-30 LAB — LP+ALT+AST PICCOLO, WAIVED
ALT (SGPT) PICCOLO, WAIVED: 31 U/L (ref 10–47)
AST (SGOT) Piccolo, Waived: 63 U/L — ABNORMAL HIGH (ref 11–38)
CHOL/HDL RATIO PICCOLO,WAIVE: 3 mg/dL
Cholesterol Piccolo, Waived: 157 mg/dL (ref <200)
HDL Chol Piccolo, Waived: 53 mg/dL — ABNORMAL LOW (ref >59)
LDL Chol Calc Piccolo Waived: 89 mg/dL (ref <100)
TRIGLYCERIDES PICCOLO,WAIVED: 76 mg/dL (ref <150)
VLDL CHOL CALC PICCOLO,WAIVE: 15 mg/dL (ref <30)

## 2015-05-30 MED ORDER — ATORVASTATIN CALCIUM 40 MG PO TABS: ORAL_TABLET | ORAL | Status: DC

## 2015-05-30 NOTE — Assessment & Plan Note (Signed)
The current medical regimen is effective;  continue present plan and medications.  

## 2015-05-30 NOTE — Assessment & Plan Note (Signed)
Chart review enzymes stable if not slightly improved

## 2015-05-30 NOTE — Progress Notes (Signed)
BP 120/67 mmHg  Pulse 56  Temp(Src) 97.6 F (36.4 C)  Ht 5\' 8"  (1.727 m)  Wt 160 lb (72.576 kg)  BMI 24.33 kg/m2  SpO2 94%   Subjective:    Patient ID: Timothy Knapp, male    DOB: Aug 20, 1940, 75 y.o.   MRN: [id number]  HPI: Timothy Knapp is a 75 y.o. male  Chief Complaint  Patient presents with  . Hyperlipidemia  . Hypertension   Patient's biggest concern discontinued problems with postherpetic neuralgia may be slightly better but still bothers him a great deal. Discussed medications discuss treatments is received and discussed consultation with some expert in the field.  Reviewed hypertension blood pressure doing well with good control no problems with medications Reviewed cholesterol especially liver function and past elevations of liver No problems or side effects with medications. Relevant past medical, surgical, family and social history reviewed and updated as indicated. Interim medical history since our last visit reviewed. Allergies and medications reviewed and updated.  Review of Systems  Constitutional: Negative.   Respiratory: Negative.   Cardiovascular: Negative.     Per HPI unless specifically indicated above     Objective:    BP 120/67 mmHg  Pulse 56  Temp(Src) 97.6 F (36.4 C)  Ht 5\' 8"  (1.727 m)  Wt 160 lb (72.576 kg)  BMI 24.33 kg/m2  SpO2 94%  Wt Readings from Last 3 Encounters:  05/30/15 160 lb (72.576 kg)  05/05/15 153 lb (69.4 kg)  04/20/15 155 lb (70.308 kg)    Physical Exam  Constitutional: He is oriented to person, place, and time. He appears well-developed and well-nourished. No distress.  HENT:  Head: Normocephalic and atraumatic.  Right Ear: Hearing normal.  Left Ear: Hearing normal.  Nose: Nose normal.  Eyes: Conjunctivae and lids are normal. Right eye exhibits no discharge. Left eye exhibits no discharge. No scleral icterus.  Cardiovascular: Normal rate, regular rhythm and normal heart sounds.   Pulmonary/Chest: Effort  normal and breath sounds normal. No respiratory distress.  Musculoskeletal: Normal range of motion.  Neurological: He is alert and oriented to person, place, and time.  Skin: Skin is intact. No rash noted.  Psychiatric: He has a normal mood and affect. His speech is normal and behavior is normal. Judgment and thought content normal. Cognition and memory are normal.    Results for orders placed or performed in visit on 05/30/15  LP+ALT+AST Piccolo, [employer]  Result Value Ref Range   ALT (SGPT) Piccolo, Waived 31 10 - 47 U/L   AST (SGOT) Piccolo, Waived 63 (H) 11 - 38 U/L   Cholesterol Piccolo, Waived 157 <200 mg/dL   HDL Chol Piccolo, Waived 53 (L) >59 mg/dL   Triglycerides Piccolo,Waived 76 <150 mg/dL   Chol/HDL Ratio Piccolo,Waive 3.0 mg/dL   LDL Chol Calc Piccolo Waived 89 <100 mg/dL   VLDL Chol Calc Piccolo,Waive 15 <30 mg/dL      Assessment & Plan:   Problem List Items Addressed This Visit      Cardiovascular and Mediastinum   Hypertension    The current medical regimen is effective;  continue present plan and medications.       Relevant Medications   atorvastatin (LIPITOR) 40 MG tablet     Nervous and Auditory   Postherpetic neuralgia    Continues to be a painful condition        Other   Hyperlipidemia - Primary    The current medical regimen is effective;  continue present plan and  medications.       Relevant Medications   atorvastatin (LIPITOR) 40 MG tablet   Other Relevant Orders   LP+ALT+AST Piccolo, Waived (Completed)   Elevated liver enzymes    Chart review enzymes stable if not slightly improved       Other Visit Diagnoses    Healthcare maintenance        Relevant Orders    Hepatitis C Antibody        Follow up plan: Return in about 6 months (around 11/30/2015) for Physical Exam.

## 2015-05-30 NOTE — Assessment & Plan Note (Signed)
Continues to be a painful condition

## 2015-05-31 ENCOUNTER — Encounter: Payer: Self-pay | Admitting: Family Medicine

## 2015-05-31 LAB — HEPATITIS C ANTIBODY: Hep C Virus Ab: <0.1 s/co ratio (ref 0.0–0.9)

## 2015-06-02 ENCOUNTER — Encounter: Payer: Self-pay | Admitting: Pain Medicine

## 2015-06-02 ENCOUNTER — Ambulatory Visit: Payer: Medicare Other | Attending: Pain Medicine | Admitting: Pain Medicine

## 2015-06-02 VITALS — BP 118/56 | HR 59 | Temp 97.6°F | Resp 18 | Ht 68.0 in | Wt 160.0 lb

## 2015-06-02 DIAGNOSIS — M5134 Other intervertebral disc degeneration, thoracic region: Secondary | ICD-10-CM

## 2015-06-02 DIAGNOSIS — B0229 Other postherpetic nervous system involvement: Secondary | ICD-10-CM | POA: Insufficient documentation

## 2015-06-02 DIAGNOSIS — M170 Bilateral primary osteoarthritis of knee: Secondary | ICD-10-CM | POA: Diagnosis not present

## 2015-06-02 DIAGNOSIS — R0782 Intercostal pain: Secondary | ICD-10-CM | POA: Diagnosis not present

## 2015-06-02 DIAGNOSIS — M5416 Radiculopathy, lumbar region: Secondary | ICD-10-CM | POA: Diagnosis not present

## 2015-06-02 DIAGNOSIS — G588 Other specified mononeuropathies: Secondary | ICD-10-CM | POA: Diagnosis not present

## 2015-06-02 DIAGNOSIS — M533 Sacrococcygeal disorders, not elsewhere classified: Secondary | ICD-10-CM | POA: Diagnosis not present

## 2015-06-02 DIAGNOSIS — M47817 Spondylosis without myelopathy or radiculopathy, lumbosacral region: Secondary | ICD-10-CM | POA: Diagnosis not present

## 2015-06-02 DIAGNOSIS — M546 Pain in thoracic spine: Secondary | ICD-10-CM | POA: Diagnosis present

## 2015-06-02 MED ORDER — AMITRIPTYLINE HCL 10 MG PO TABS
ORAL_TABLET | ORAL | Status: DC
Start: 2015-06-02 — End: 2015-06-02

## 2015-06-02 MED ORDER — AMITRIPTYLINE HCL 10 MG PO TABS: ORAL_TABLET | ORAL | Status: DC

## 2015-06-02 MED ORDER — OXYCODONE HCL 5 MG PO TABS
ORAL_TABLET | ORAL | Status: DC
Start: 2015-06-02 — End: 2015-06-30

## 2015-06-02 MED ORDER — DULOXETINE HCL 30 MG PO CPEP: ORAL_CAPSULE | ORAL | Status: DC

## 2015-06-02 NOTE — Progress Notes (Signed)
Safety precautions to be maintained throughout the outpatient stay will include: orient to surroundings, keep bed in low position, maintain call bell within reach at all times, provide assistance with transfer out of bed and ambulation.  

## 2015-06-02 NOTE — Patient Instructions (Addendum)
PLAN   Continue present medication Neurontin Cymbalta Elavil vitamin B complex Lidoderm patch and oxycodone . Timothy Knapp As discussed try  taking 3 Elavil (amitriptyline) instead of 2 amitriptyline  per day  Intercostal nerve blocks to be performed at time of return appointment  F/U PCP Dr. Jeananne Rama  for evaliation of  BP and general medical  condition.   F/U surgical evaluation Patient is without plans for surgical evaluation at this time  Discuss pain of knees with surgeon as we previously discussed  TENS unit for thoracic pain due to postherpetic neuralgia.   F/U neurological evaluation. May consider pending follow-up evaluations  May consider radiofrequency rhizolysis or intraspinal procedures pending response to present treatment and F/U evaluation   Patient to call [facility] should patient have concerns prior to scheduled return appointment.Intercostal Nerve Block Patient Information  Description: The twelve intercostal nerves arise from the first thru twelfth thoracic nerve roots.  The nerve begins at the spine and wraps around the body, lying in a groove underneath each rib.  Each intercostal nerve innervates a specific strip of skin and body walk of the abdomen and chest.  Therefore, injuries of the chest wall or abdominal wall result in pain that is transmitted back to the brian via the intercostal nerves.  Examples of such injuries include rib fractures and incisions for lung and gall bladder surgery.  Occasionally, pain may persist long after an injury or surgical incision secondary to inflammation and irritation of the intercostal nerve.  The longstanding pain is known as intercostal neuralgia.  An intercostal nerve block is preformed to eliminate pain either temporarily or permanently.  A small needle is placed below the rib and local anesthetic (like Novocaine) and possibly steroid is injected.  Usually 2-4 intercostal nerves are blocked at a time depending on the problem.   The patient will experience a slight "pin-prick" sensation for each injection.  Shortly thereafter, the strip of skin that is innervated by the blocked intercostal nerve will feel numb.  Persistent pain that is only temporarily relieved with local anesthetic may require a more permanent block. This procedure is called Cryoneurolysis and entails placing a small probe beneath the rib to freeze the nerve.  Conditions that may be treated by intercostal nerve blocks:   Rib fractures  Longstanding pain from surgery of the chest or abdomen (intercostal neuralgia)  Pain from chest tubes  Pain from trauma to the chest  Preparation for the injections:  1. Do not eat any solid food or dairy products within 8 hours of your appointment. 2. You may drink clear liquids up to 3 hours before appointment.  Clear liquids include water, black coffee, juice or soda.  No milk or cream please. 3. You may take your regular medication, including pain medications, with a sip of water before your appointment.  Diabetics should hold regular insulin (if take separately) and take 1/2 normal NPH dose the morning of the procedure.   Carry some sugar containing items with you to your appointment. 4. A driver must accompany you and be prepared to drive you home after your procedure. 5. Bring all your current medications with you. 6. An IV may be inserted and sedation may be given at the discretion of the physician. 7. A blood pressure cuff, EKG and other monitors will often be applied during the procedure.  Some patients may need to have extra oxygen administered for a short period. 8. You will be asked to provide medical information, including your allergies,  prior to the procedure.  We must know immediately if you are taking blood thinners (like Coumadin/Warfarin) or if you are allergic to IV iodine contrast (dye). We must know if you could possible be pregnant.  Possible side-effects:   Bleeding from needle  site  Infection (rare)  Nerve injury (rare)  Numbness & tingling of skin  Collapsed lung requiring chest tube (rare)  Local anesthetic toxicity (rare)  Light-headedness (temporary)  Pain at injection site (several days)  Decreased blood pressure (temporary)  Shortness of breath  Jittery/shaking sensation (temporary)  Call if you experience:   Difficulty breathing or hives (go directly to the emergency room)  Redness, inflammation or drainage at the injection site  Severe pain at the site of the injection  Any new symptoms which are concerning   Please note:  Your pain may subside immediately but may return several hours after the injection.  Often, more than one injection is required to reduce the pain. Also, if several temporary blocks with local anesthetic are ineffective, a more permanent block with cryolysis may be necessary.  This will be discussed with you should this be the case.  If you have any questions, please call [phone] [place]  What are the risk, side effects and possible complications? Generally speaking, most procedures are safe.  However, with any procedure there are risks, side effects, and the possibility of complications.  The risks and complications are dependent upon the sites that are lesioned, or the type of nerve block to be performed.  The closer the procedure is to the spine, the more serious the risks are.  Great care is taken when placing the radio frequency needles, block needles or lesioning probes, but sometimes complications can occur. 1. Infection: Any time there is an injection through the skin, there is a risk of infection.  This is why sterile conditions are used for these blocks.  There are four possible types of infection. 1. Localized skin infection. 2. Central Nervous System Infection-This can be in the form of Meningitis, which can be  deadly. 3. Epidural Infections-This can be in the form of an epidural abscess, which can cause pressure inside of the spine, causing compression of the spinal cord with subsequent paralysis. This would require an emergency surgery to decompress, and there are no guarantees that the patient would recover from the paralysis. 4. Discitis-This is an infection of the intervertebral discs.  It occurs in about 1% of discography procedures.  It is difficult to treat and it may lead to surgery.        2. Pain: the needles have to go through skin and soft tissues, will cause soreness.       3. Damage to internal structures:  The nerves to be lesioned may be near blood vessels or    other nerves which can be potentially damaged.       4. Bleeding: Bleeding is more common if the patient is taking blood thinners such as  aspirin, Coumadin, Ticiid, Plavix, etc., or if he/she have some genetic predisposition  such as hemophilia. Bleeding into the spinal canal can cause compression of the spinal  cord with subsequent paralysis.  This would require an emergency surgery to  decompress and there are no guarantees that the patient would recover from the  paralysis.       5. Pneumothorax:  Puncturing of a lung is a possibility, every time a needle is  introduced in  the area of the chest or upper back.  Pneumothorax refers to free air around the  collapsed lung(s), inside of the thoracic cavity (chest cavity).  Another two possible  complications related to a similar event would include: Hemothorax and Chylothorax.   These are variations of the Pneumothorax, where instead of air around the collapsed  lung(s), you may have blood or chyle, respectively.       6. Spinal headaches: They may occur with any procedures in the area of the spine.       7. Persistent CSF (Cerebro-Spinal Fluid) leakage: This is a rare problem, but may occur  with prolonged intrathecal or epidural catheters either due to the formation of a fistulous  track  or a dural tear.       8. Nerve damage: By working so close to the spinal cord, there is always a possibility of  nerve damage, which could be as serious as a permanent spinal cord injury with  paralysis.       9. Death:  Although rare, severe deadly allergic reactions known as "Anaphylactic  reaction" can occur to any of the medications used.      10. Worsening of the symptoms:  We can always make thing worse.  What are the chances of something like this happening? Chances of any of this occuring are extremely low.  By statistics, you have more of a chance of getting killed in a motor vehicle accident: while driving to the hospital than any of the above occurring .  Nevertheless, you should be aware that they are possibilities.  In general, it is similar to taking a shower.  Everybody knows that you can slip, hit your head and get killed.  Does that mean that you should not shower again?  Nevertheless always keep in mind that statistics do not mean anything if you happen to be on the wrong side of them.  Even if a procedure has a 1 (one) in a 1,000,000 (million) chance of going wrong, it you happen to be that one..Also, keep in mind that by statistics, you have more of a chance of having something go wrong when taking medications.  Who should not have this procedure? If you are on a blood thinning medication (e.g. Coumadin, Plavix, see list of "Blood Thinners"), or if you have an active infection going on, you should not have the procedure.  If you are taking any blood thinners, please inform your physician.  How should I prepare for this procedure?  Do not eat or drink anything at least six hours prior to the procedure.  Bring a driver with you .  It cannot be a taxi.  Come accompanied by an adult that can drive you back, and that is strong enough to help you if your legs get weak or numb from the local anesthetic.  Take all of your medicines the morning of the procedure with just enough water  to swallow them.  If you have diabetes, make sure that you are scheduled to have your procedure done first thing in the morning, whenever possible.  If you have diabetes, take only half of your insulin dose and notify our nurse that you have done so as soon as you arrive at the clinic.  If you are diabetic, but only take blood sugar pills (oral hypoglycemic), then do not take them on the morning of your procedure.  You may take them after you have had the procedure.  Do not  take aspirin or any aspirin-containing medications, at least eleven (11) days prior to the procedure.  They may prolong bleeding.  Wear loose fitting clothing that may be easy to take off and that you would not mind if it got stained with Betadine or blood.  Do not wear any jewelry or perfume  Remove any nail coloring.  It will interfere with some of our monitoring equipment.  NOTE: Remember that this is not meant to be interpreted as a complete list of all possible complications.  Unforeseen problems may occur.  BLOOD THINNERS The following drugs contain aspirin or other products, which can cause increased bleeding during surgery and should not be taken for 2 weeks prior to and 1 week after surgery.  If you should need take something for relief of minor pain, you may take acetaminophen which is found in Tylenol,m Datril, Anacin-3 and Panadol. It is not blood thinner. The products listed below are.  Do not take any of the products listed below in addition to any listed on your instruction sheet.  A.P.C or A.P.C with Codeine Codeine Phosphate Capsules #3 Ibuprofen Ridaura  ABC compound Congesprin Imuran rimadil  Advil Cope Indocin Robaxisal  Alka-Seltzer Effervescent Pain Reliever and Antacid Coricidin or Coricidin-D  Indomethacin Rufen  Alka-Seltzer plus Cold Medicine Cosprin Ketoprofen S-A-C Tablets  Anacin Analgesic Tablets or Capsules Coumadin Korlgesic Salflex  Anacin Extra Strength Analgesic tablets or capsules  CP-2 Tablets Lanoril Salicylate  Anaprox Cuprimine Capsules Levenox Salocol  Anexsia-D Dalteparin Magan Salsalate  Anodynos Darvon compound Magnesium Salicylate Sine-off  Ansaid Dasin Capsules Magsal Sodium Salicylate  Anturane Depen Capsules Marnal Soma  APF Arthritis pain formula Dewitt's Pills Measurin Stanback  Argesic Dia-Gesic Meclofenamic Sulfinpyrazone  Arthritis Bayer Timed Release Aspirin Diclofenac Meclomen Sulindac  Arthritis pain formula Anacin Dicumarol Medipren Supac  Analgesic (Safety coated) Arthralgen Diffunasal Mefanamic Suprofen  Arthritis Strength Bufferin Dihydrocodeine Mepro Compound Suprol  Arthropan liquid Dopirydamole Methcarbomol with Aspirin Synalgos  ASA tablets/Enseals Disalcid Micrainin Tagament  Ascriptin Doan's Midol Talwin  Ascriptin A/D Dolene Mobidin Tanderil  Ascriptin Extra Strength Dolobid Moblgesic Ticlid  Ascriptin with Codeine Doloprin or Doloprin with Codeine Momentum Tolectin  Asperbuf Duoprin Mono-gesic Trendar  Aspergum Duradyne Motrin or Motrin IB Triminicin  Aspirin plain, buffered or enteric coated Durasal Myochrisine Trigesic  Aspirin Suppositories Easprin Nalfon Trillsate  Aspirin with Codeine Ecotrin Regular or Extra Strength Naprosyn Uracel  Atromid-S Efficin Naproxen Ursinus  Auranofin Capsules Elmiron Neocylate Vanquish  Axotal Emagrin Norgesic Verin  Azathioprine Empirin or Empirin with Codeine Normiflo Vitamin E  Azolid Emprazil Nuprin Voltaren  Bayer Aspirin plain, buffered or children's or timed BC Tablets or powders Encaprin Orgaran Warfarin Sodium  Buff-a-Comp Enoxaparin Orudis Zorpin  Buff-a-Comp with Codeine Equegesic Os-Cal-Gesic   Buffaprin Excedrin plain, buffered or Extra Strength Oxalid   Bufferin Arthritis Strength Feldene Oxphenbutazone   Bufferin plain or Extra Strength Feldene Capsules Oxycodone with Aspirin   Bufferin with Codeine Fenoprofen Fenoprofen Pabalate or Pabalate-SF   Buffets II Flogesic Panagesic    Buffinol plain or Extra Strength Florinal or Florinal with Codeine Panwarfarin   Buf-Tabs Flurbiprofen Penicillamine   Butalbital Compound Four-way cold tablets Penicillin   Butazolidin Fragmin Pepto-Bismol   Carbenicillin Geminisyn Percodan   Carna Arthritis Reliever Geopen Persantine   Carprofen Gold's salt Persistin   Chloramphenicol Goody's Phenylbutazone   Chloromycetin Haltrain Piroxlcam   Clmetidine heparin Plaquenil   Cllnoril Hyco-pap Ponstel   Clofibrate Hydroxy chloroquine Propoxyphen         Before stopping any of  these medications, be sure to consult the physician who ordered them.  Some, such as Coumadin (Warfarin) are ordered to prevent or treat serious conditions such as "deep thrombosis", "pumonary embolisms", and other heart problems.  The amount of time that you may need off of the medication may also vary with the medication and the reason for which you were taking it.  If you are taking any of these medications, please make sure you notify your pain physician before you undergo any procedures.

## 2015-06-03 NOTE — Progress Notes (Signed)
Subjective:    Patient ID: Timothy Knapp, male    DOB: 07-03-1940, 75 y.o.   MRN: [id number]  HPI  The patient is a 75 year old gentleman who returns to pain management for further evaluation and treatment of pain involving the region of the right thoracic region with prior history of shingles and with pain felt to be due to postherpetic neuralgia. The patient states that pain has decreased significantly and he continues to be with pain of the right thoracic region a moderate degree at times. The patient is able to wear clothing without experiencing severe disabling pain and explained to perform most activities of daily living without severe disabling pain. We recently increased patient's medications with the addition of Elavil to patient's Neurontin Cymbalta vitamin B complex and oxycodone. The patient denies any trauma change in events of daily living the call significant change in symptomatology. We will proceed with intercostal nerve block to be performed at time return appointment and we will also have patient undergo evaluation at [facility] for further evaluation of patient's condition and recommendations regarding patient treatment regimen. We have discussed prior analgesia radiofrequency procedures spinal cord stimulation and other treatment which we will consider pending recommendations from [facility]. We will proceed with intercostal nerve block at time return appointment as discussed and we will remain available to consider additional modifications of treatment regimen as discussed and as explained to patient on today's visit. All agreed to suggested treatment plan      Review of Systems     Objective:   Physical Exam  There was mild tenderness of the splenius capitis and occipitalis region and mild tenderness to palpation of the cervical facet cervical paraspinal musculature region. Palpation of the acromioclavicular and glenohumeral joint regions  reproduce mild discomfort. The patient appeared to be with unremarkable Spurling's maneuver as well as unremarkable drop test. Palpation over the thoracic region thoracic paraspinal musculature region was attends to palpation of moderate degree with palpation of the mid thoracic region reproducing moderately severe discomfort. There was mild increase of pain with grasping skin between the digits. No new deformed lesions of the skin were noted. No crepitus of the thoracic region noted. The patient was with bilaterally equal grip strength without increased pain with Tinel and Phalen's maneuver. Palpation over the lumbar paraspinal must reason lumbar facet region was attends to palpation of mild degree with lateral bending rotation reproducing mild discomfort. Palpation of the PSIS and PII S region reproduced mild discomfort. The knees were without increased pain of significant degree. There was mild crepitus of the thoracic region noted. There appeared to be negative anterior and posterior drawer signs without ballottement of the patella. Straight leg raise was tolerates 30 without increased pain with dorsiflexion noted. No sensory deficit or dermatomal distribution detected. There was negative clonus negative Homans. Abdomen nontender with no costovertebral tenderness noted       Assessment & Plan:       Postherpetic neuralgia  Intercostal neuralgia  Degenerative joint disease of knees    PLAN   Continue present medication Neurontin Cymbalta Elavil vitamin B complex Lidoderm patch and oxycodone . [name] As discussed try  taking 3 Elavil (amitriptyline) instead of 2 amitriptyline  per day  Intercostal nerve blocks to be performed at time of return appointment  F/U PCP Dr. Jeananne Rama  for evaliation of  BP and general medical  condition.   F/U surgical evaluation Patient is without plans for surgical evaluation at this time  Discuss pain of knees with surgeon as we previously discussed  TENS unit  for thoracic pain due to postherpetic neuralgia.   Evaluation at [facility] as discussed  F/U neurological evaluation. May consider pending follow-up evaluations  May consider radiofrequency rhizolysis or intraspinal procedures pending response to present treatment and F/U evaluation   Patient to call Pain Management Center should patient have concerns prior to scheduled return appointment

## 2015-06-10 LAB — TOXASSURE SELECT 13 (MW), URINE

## 2015-06-10 NOTE — Progress Notes (Signed)
Quick Note:  Reviewed. ______ 

## 2015-06-13 ENCOUNTER — Encounter: Payer: Self-pay | Admitting: Pain Medicine

## 2015-06-13 ENCOUNTER — Ambulatory Visit: Payer: Medicare Other | Attending: Pain Medicine | Admitting: Pain Medicine

## 2015-06-13 VITALS — BP 126/76 | HR 60 | Temp 97.5°F | Resp 15 | Ht 68.0 in | Wt 157.0 lb

## 2015-06-13 DIAGNOSIS — M546 Pain in thoracic spine: Secondary | ICD-10-CM | POA: Diagnosis not present

## 2015-06-13 DIAGNOSIS — R0782 Intercostal pain: Secondary | ICD-10-CM | POA: Diagnosis not present

## 2015-06-13 DIAGNOSIS — M5134 Other intervertebral disc degeneration, thoracic region: Secondary | ICD-10-CM

## 2015-06-13 DIAGNOSIS — B0229 Other postherpetic nervous system involvement: Secondary | ICD-10-CM

## 2015-06-13 MED ORDER — FENTANYL CITRATE (PF) 100 MCG/2ML IJ SOLN
100.0000 ug | Freq: Once | INTRAMUSCULAR | Status: AC
  Administered 2015-06-13: 50 ug via INTRAVENOUS
  Filled 2015-06-13: qty 2

## 2015-06-13 MED ORDER — TRIAMCINOLONE ACETONIDE 40 MG/ML IJ SUSP
40.0000 mg | Freq: Once | INTRAMUSCULAR | Status: AC
  Administered 2015-06-13: 40 mg
  Filled 2015-06-13: qty 1

## 2015-06-13 MED ORDER — ORPHENADRINE CITRATE 30 MG/ML IJ SOLN
60.0000 mg | Freq: Once | INTRAMUSCULAR | Status: DC
  Filled 2015-06-13: qty 2

## 2015-06-13 MED ORDER — BUPIVACAINE HCL (PF) 0.5 % IJ SOLN
30.0000 mL | Freq: Once | INTRAMUSCULAR | Status: AC
  Administered 2015-06-13: 30 mL
  Filled 2015-06-13: qty 30

## 2015-06-13 MED ORDER — LACTATED RINGERS IV SOLN: 1000.0000 mL | INTRAVENOUS | Status: DC

## 2015-06-13 MED ORDER — MIDAZOLAM HCL 5 MG/5ML IJ SOLN
5.0000 mg | Freq: Once | INTRAMUSCULAR | Status: AC
  Administered 2015-06-13: 2 mg via INTRAVENOUS
  Filled 2015-06-13: qty 5

## 2015-06-13 NOTE — Progress Notes (Signed)
Patient here for procedure d/t post herpetic neuralgia. Safety precautions to be maintained throughout the outpatient stay will include: orient to surroundings, keep bed in low position, maintain call bell within reach at all times, provide assistance with transfer out of bed and ambulation.

## 2015-06-13 NOTE — Patient Instructions (Addendum)
PLAN   Continue present medication Neurontin Cymbalta Elavil vitamin B complex Lidoderm patch and oxycodone . [name] As discussed try  taking 3 Elavil (amitriptyline) instead of 2 amitriptyline  per day  F/U PCP Dr. Jeananne Rama  for evaliation of  BP and general medical  condition.   F/U surgical evaluation Patient is without plans for surgical evaluation at this time  Discuss pain of knees with surgeon as we previously discussed  TENS unit for thoracic pain due to postherpetic neuralgia.   F/U neurological evaluation. May consider pending follow-up evaluations  May consider radiofrequency rhizolysis or intraspinal procedures pending response to present treatment and F/U evaluation   Patient to call Pain Management Center should patient have concerns prior to scheduled return appointmentSelective Nerve Root Block Patient Information  Description: Specific nerve roots exit the spinal canal and these nerves can be compressed and inflamed by a bulging disc and bone spurs.  By injecting steroids on the nerve root, we can potentially decrease the inflammation surrounding these nerves, which often leads to decreased pain.  Also, by injecting local anesthesia on the nerve root, this can provide [place] helpful information to give to your referring doctor if it decreases your pain.  Selective nerve root blocks can be done along the spine from the neck to the low back depending on the location of your pain.   After numbing the skin with local anesthesia, a small needle is passed to the nerve root and the position of the needle is verified using x-ray pictures.  After the needle is in correct position, we then deposit the medication.  You may experience a pressure sensation while this is being done.  The entire block usually lasts less than 15 minutes.  Conditions that may be treated with selective nerve root blocks:  Low back and leg pain  Spinal stenosis  Diagnostic block prior to potential surgery  Neck and  arm pain  Post laminectomy syndrome  Preparation for the injection:  1. Do not eat any solid food or dairy products within 8 hours of your appointment. 2. You may drink clear liquids up to 3 hours before an appointment.  Clear liquids include water, black coffee, juice or soda.  No milk or cream please. 3. You may take your regular medications, including pain medications, with a sip of water before your appointment.  Diabetics should hold regular insulin (if taken separately) and take 1/2 normal NPH dose the morning of the procedure.  Carry some sugar containing items with you to your appointment. 4. A driver must accompany you and be prepared to drive you home after your procedure. 5. Bring all your current medications with you. 6. An IV may be inserted and sedation may be given at the discretion of the physician. 7. A blood pressure cuff, EKG, and other monitors will often be applied during the procedure.  Some patients may need to have extra oxygen administered for a short period. 8. You will be asked to provide medical information, including allergies, prior to the procedure.  We must know immediately if you are taking blood  Thinners (like Coumadin) or if you are allergic to IV iodine contrast (dye).  Possible side-effects: All are usually temporary  Bleeding from needle site  Light headedness  Numbness and tingling  Decreased blood pressure  Weakness in arms/legs  Pressure sensation in back/neck  Pain at injection site (several days)  Possible complications: All are extremely rare  Infection  Nerve injury  Spinal headache (a headache wore  with upright position)  Call if you experience:  Fever/chills associated with headache or increased back/neck pain  Headache worsened by an upright position  New onset weakness or numbness of an extremity below the injection site  Hives or difficulty breathing (go to the emergency room)  Inflammation or drainage at the  injection site(s)  Severe back/neck pain greater than usual  New symptoms which are concerning to you  Please note:  Although the local anesthetic injected can often make your back or neck feel good for several hours after the injection the pain will likely return.  It takes 3-5 days for steroids to work on the nerve root. You may not notice any pain relief for at least one week.  If effective, we will often do a series of 3 injections spaced 3-6 weeks apart to maximally decrease your pain.    If you have any questions, please call [phone] [facility] Discharge Inst[facility]  General Discharge Instructions :  If you need to reach your doctor call: Monday-Friday 8:00 am - 4:00 pm at [phone] or toll free [phone].  After clinic hours [phone] to have operator reach doctor.  Bring all of your medication bottles to all your appointments in the pain clinic.  To cancel or reschedule your appointment with Pain Management please remember to call 24 hours in advance to avoid a fee.  Refer to the educational materials which you have been given on: General Risks, I had my Procedure. Discharge Instructions, Post Sedation.  Post Procedure Instructions:  The drugs you were given will stay in your system until tomorrow, so for the next 24 hours you should not drive, make any legal decisions or drink any alcoholic beverages.  You may eat anything you prefer, but it is better to start with liquids then soups and crackers, and gradually work up to solid foods.  Please notify your doctor immediately if you have any unusual bleeding, trouble breathing or pain that is not related to your normal pain.  Depending on the type of procedure that was done, some parts of your body may feel week and/or numb.  This usually clears up by tonight or the next day.  Walk with the use of an assistive device or accompanied by an adult for the 24  hours.  You may use ice on the affected area for the first 24 hours.  Put ice in a Ziploc bag and cover with a towel and place against area 15 minutes on 15 minutes off.  You may switch to heat after 24 hours.

## 2015-06-13 NOTE — Progress Notes (Signed)
   Subjective:    Patient ID: Timothy Knapp, male    DOB: 12/03/1940, 75 y.o.   MRN: [id number]  HPI  PROCEDURE PERFORMED:  Intercostal nerve block.  HISTORY OF PRESENT ILLNESS:  The patient is a 75 y.o. male who returns to the [facility] for further evaluation and treatment of pain involving the Thoracic region of the back. 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 The risks, benefits, and expectations of the procedure have been discussed and explained to the patient who was understanding and in agreement with suggested treatment plan. We will proceed with interventional treatment as discussed.   DESCRIPTION OF PROCEDURE: Intercostal nerve block with IV Versed, IV fentanyl conscious sedation, EKG, blood pressure, pulse, capnography, and pulse oximetry monitoring. The procedure was performed with the patient in the prone position under fluoroscopic guidance.   Intercostal nerve block, right side: With the patient in the prone position, Betadine prep of proposed entry site was performed under fluoroscopic guidance with AP view of the thoracic spine. Under fluoroscopic guidance, a 22 -gauge needle was inserted to contact bone of the a ninth rib on the right side after which the needle was repositioned at the inferior border of the ninth rib on the right side under fluoroscopic guidance. Following documentation of needle placement, at the inferior border of the ninth rib on the right side and negative aspiration, a total of 3 mL of 0.50% bupivacaine with Kenalog was injected for right side for 90 rib intercostal nerve block.   INTERCOSTAL NERVE BLOCKS AT T8, T7, T6, T5, and T4 LEVELS: The procedure was performed at these levels as was performed at the previous level, T9, utilizing the same technique and under fluoroscopic guidance  Myoneural block injection of the thoracic  region Following Betadine prep of proposed entry site a 22-gauge needle was inserted into the thoracic musculature region and following negative aspiration 2 cc of 0.5% bupivacaine with Norflex was injected for myoneural block injection of the thoracic region times two.  The patient tolerated procedure well  A total of 10 mg Kenalog was utilized for the procedure.   PLAN:   1. Medications: We will continue presently prescribed medications Neurontin Cymbalta Elavil oxycodone vitamin B complex and Lidoderm patch 2. The patient is to follow up with primary care physician Dr. Jeananne Rama for further evaluation of blood pressure and general medical condition as discussed. 3. Surgical evaluation. Has been addressed. We will avoid surgical evaluation at this time 4. Neurological evaluation. We will avoid further neurological evaluation at this time 5. May consider the patient for additional studies pending response to treatment and follow-up evaluation. We will also discuss referral to [facility] for further evaluation and recommendation     May consider radiofrequency procedures, implantation type procedures and other       treatment pending response to treatment and follow-up evaluation. 6. The patient has been advised to adhere to proper body mechanics and to call the Pain Management Center prior to scheduled return appointment should there be significant change in condition or have other concerns regarding condition prior to scheduled return appointment.  The patient is understanding and in agreement with suggested treatment plan.    Review of Systems     Objective:   Physical Exam        Assessment & Plan:

## 2015-06-14 ENCOUNTER — Telehealth: Payer: Self-pay | Admitting: *Deleted

## 2015-06-14 NOTE — Telephone Encounter (Signed)
Spoke with patient re; procedure on yesterday, verbalizes no questions or concerns.  

## 2015-06-16 ENCOUNTER — Other Ambulatory Visit: Payer: Self-pay | Admitting: Pain Medicine

## 2015-06-30 ENCOUNTER — Encounter: Payer: Self-pay | Admitting: Pain Medicine

## 2015-06-30 ENCOUNTER — Ambulatory Visit: Payer: Medicare Other | Attending: Pain Medicine | Admitting: Pain Medicine

## 2015-06-30 VITALS — BP 116/76 | HR 68 | Temp 98.0°F | Resp 18 | Ht 68.0 in | Wt 155.0 lb

## 2015-06-30 DIAGNOSIS — M5416 Radiculopathy, lumbar region: Secondary | ICD-10-CM | POA: Diagnosis not present

## 2015-06-30 DIAGNOSIS — Z8619 Personal history of other infectious and parasitic diseases: Secondary | ICD-10-CM | POA: Diagnosis not present

## 2015-06-30 DIAGNOSIS — M791 Myalgia: Secondary | ICD-10-CM | POA: Diagnosis not present

## 2015-06-30 DIAGNOSIS — M792 Neuralgia and neuritis, unspecified: Secondary | ICD-10-CM | POA: Diagnosis not present

## 2015-06-30 DIAGNOSIS — M170 Bilateral primary osteoarthritis of knee: Secondary | ICD-10-CM | POA: Insufficient documentation

## 2015-06-30 DIAGNOSIS — M5134 Other intervertebral disc degeneration, thoracic region: Secondary | ICD-10-CM

## 2015-06-30 DIAGNOSIS — M47817 Spondylosis without myelopathy or radiculopathy, lumbosacral region: Secondary | ICD-10-CM | POA: Diagnosis not present

## 2015-06-30 DIAGNOSIS — R0782 Intercostal pain: Secondary | ICD-10-CM | POA: Diagnosis not present

## 2015-06-30 DIAGNOSIS — M546 Pain in thoracic spine: Secondary | ICD-10-CM | POA: Diagnosis present

## 2015-06-30 DIAGNOSIS — B0229 Other postherpetic nervous system involvement: Secondary | ICD-10-CM

## 2015-06-30 MED ORDER — AMITRIPTYLINE HCL 10 MG PO TABS: ORAL_TABLET | ORAL | Status: DC

## 2015-06-30 MED ORDER — OXYCODONE HCL 5 MG PO TABS: ORAL_TABLET | ORAL | Status: DC

## 2015-06-30 MED ORDER — GABAPENTIN 800 MG PO TABS: ORAL_TABLET | ORAL | Status: DC

## 2015-06-30 MED ORDER — DULOXETINE HCL 30 MG PO CPEP: ORAL_CAPSULE | ORAL | Status: DC

## 2015-06-30 NOTE — Progress Notes (Signed)
   Subjective:    Patient ID: Timothy Knapp, male    DOB: 02/01/1940, 75 y.o.   MRN: [id number]  HPI  The patient is a 75 year old gentleman who returns to pain management for further evaluation and treatment of pain involving the thoracic region on the right. The patient is a history of shingles with what appears to be pain of the thoracic region due to postherpetic neuralgia. The patient stated he had tremendous relief of pain following previous procedure performed in pain management Center. We will proceed with intercostal nerve block at time return appointment as discussed and patient will continue medications consisting of Neurontin Cymbalta Elavil oxycodone and vitamin B complex.. The patient denied any trauma change in events of daily living the call significant change in symptomatology. We will remain available to consider additional modifications of treatment pending response to present treatment and follow-up evaluation. The patient was very pleased with the amount of relief obtained from the previous procedure and wished to proceed with additional interventional treatment in attempt to decrease the pain was significantly All agreed to suggested treatment plan.     Review of Systems     Objective:   Physical Exam  There was tenderness to palpation of paraspinal misreading conserving cervical facet region palpation which be produced pain of minimal degree with minimal tenderness of the splenius capitis and occipitalis regions. Palpation over the acromioclavicular and glenohumeral joint regions reproduces minimal discomfort. Palpation over the region thoracic region thoracic facet region was attends to palpation of moderate degree of the mid thoracic region on the right. Outpatient of the mid thoracic region on the right reproduces patient's pain. There was no evidence of new lesions of the skin noted. No crepitus of the thoracic region noted. Palpation the lumbar region lumbar facet region  was attends to palpation of mild degree with mild tenderness over the lumbar facet lumbar paraspinal must reason. There was mild tenderness over the PSIS and PII S region as well as the greater trochanteric region iliotibial band region. Straight leg raise was tolerates a 30 without increased pain with dorsiflexion noted. The knees were with minimal tenderness to palpation with crepitus of the knees with negative anterior and posterior drawer signs without ballottement of the patella. EHL strength appeared to be equal. There was no sensory deficit dermatomal distribution detected. There was negative clonus negative Homans Abdomen nontender with no costovertebral tenderness noted      Assessment & Plan:     Postherpetic neuralgia  Intercostal neuralgia  Degenerative joint disease of knees       PLAN   Continue present medication Neurontin Cymbalta Elavil vitamin B complex Lidoderm patch and oxycodone . May take 3 Elavil (amitriptyline)   per day as discussed  Intercostal nerve blocks to be performed at time return appointment  F/U PCP Dr. Jeananne Rama  for evaliation of  BP and general medical  condition.   F/U surgical evaluation Patient is without plans for surgical evaluation at this time  Discuss pain of knees with surgeon as we previously discussed  TENS unit for thoracic pain due to postherpetic neuralgia.   F/U neurological evaluation. May consider pending follow-up evaluations  Evaluation at [place] to be considered as discussed  May consider radiofrequency rhizolysis or intraspinal procedures pending response to present treatment and F/U evaluation   Patient to call Pain Management Center should patient have concerns prior to scheduled return appointment.

## 2015-06-30 NOTE — Progress Notes (Signed)
Safety precautions to be maintained throughout the outpatient stay will include: orient to surroundings, keep bed in low position, maintain call bell within reach at all times, provide assistance with transfer out of bed and ambulation.  

## 2015-06-30 NOTE — Patient Instructions (Addendum)
PLAN   Continue present medication Neurontin Cymbalta Elavil vitamin B complex Lidoderm patch and oxycodone . May take 3 Elavil (amitriptyline)   per day as discussed  Intercostal nerve blocks to be performed at time return appointment  F/U PCP Dr. Jeananne Rama  for evaliation of  BP and general medical  condition.   F/U surgical evaluation Patient is without plans for surgical evaluation at this time  Discuss pain of knees with surgeon as we previously discussed  TENS unit for thoracic pain due to postherpetic neuralgia.   F/U neurological evaluation. May consider pending follow-up evaluations  Evaluation at [facility] to be considered as discussed  May consider radiofrequency rhizolysis or intraspinal procedures pending response to present treatment and F/U evaluation   Patient to call [facility] should patient have concerns prior to scheduled return appointment.GENERAL RISKS AND COMPLICATIONS  What are the risk, side effects and possible complications? Generally speaking, most procedures are safe.  However, with any procedure there are risks, side effects, and the possibility of complications.  The risks and complications are dependent upon the sites that are lesioned, or the type of nerve block to be performed.  The closer the procedure is to the spine, the more serious the risks are.  Great care is taken when placing the radio frequency needles, block needles or lesioning probes, but sometimes complications can occur. 1. Infection: Any time there is an injection through the skin, there is a risk of infection.  This is why sterile conditions are used for these blocks.  There are four possible types of infection. 1. Localized skin infection. 2. Central Nervous System Infection-This can be in the form of Meningitis, which can be deadly. 3. Epidural Infections-This can be in the form of an epidural abscess, which can cause pressure inside of the spine, causing  compression of the spinal cord with subsequent paralysis. This would require an emergency surgery to decompress, and there are no guarantees that the patient would recover from the paralysis. 4. Discitis-This is an infection of the intervertebral discs.  It occurs in about 1% of discography procedures.  It is difficult to treat and it may lead to surgery.        2. Pain: the needles have to go through skin and soft tissues, will cause soreness.       3. Damage to internal structures:  The nerves to be lesioned may be near blood vessels or    other nerves which can be potentially damaged.       4. Bleeding: Bleeding is more common if the patient is taking blood thinners such as  aspirin, Coumadin, Ticiid, Plavix, etc., or if he/she have some genetic predisposition  such as hemophilia. Bleeding into the spinal canal can cause compression of the spinal  cord with subsequent paralysis.  This would require an emergency surgery to  decompress and there are no guarantees that the patient would recover from the  paralysis.       5. Pneumothorax:  Puncturing of a lung is a possibility, every time a needle is introduced in  the area of the chest or upper back.  Pneumothorax refers to free air around the  collapsed lung(s), inside of the thoracic cavity (chest cavity).  Another two possible  complications related to a similar event would include: Hemothorax and Chylothorax.   These are variations of the Pneumothorax, where instead of air around the collapsed  lung(s), you may have blood or chyle, respectively.  6. Spinal headaches: They may occur with any procedures in the area of the spine.       7. Persistent CSF (Cerebro-Spinal Fluid) leakage: This is a rare problem, but may occur  with prolonged intrathecal or epidural catheters either due to the formation of a fistulous  track or a dural tear.       8. Nerve damage: By working so close to the spinal cord, there is always a possibility of  nerve damage,  which could be as serious as a permanent spinal cord injury with  paralysis.       9. Death:  Although rare, severe deadly allergic reactions known as "Anaphylactic  reaction" can occur to any of the medications used.      10. Worsening of the symptoms:  We can always make thing worse.  What are the chances of something like this happening? Chances of any of this occuring are extremely low.  By statistics, you have more of a chance of getting killed in a motor vehicle accident: while driving to the hospital than any of the above occurring .  Nevertheless, you should be aware that they are possibilities.  In general, it is similar to taking a shower.  Everybody knows that you can slip, hit your head and get killed.  Does that mean that you should not shower again?  Nevertheless always keep in mind that statistics do not mean anything if you happen to be on the wrong side of them.  Even if a procedure has a 1 (one) in a 1,000,000 (million) chance of going wrong, it you happen to be that one..Also, keep in mind that by statistics, you have more of a chance of having something go wrong when taking medications.  Who should not have this procedure? If you are on a blood thinning medication (e.g. Coumadin, Plavix, see list of "Blood Thinners"), or if you have an active infection going on, you should not have the procedure.  If you are taking any blood thinners, please inform your physician.  How should I prepare for this procedure?  Do not eat or drink anything at least six hours prior to the procedure.  Bring a driver with you .  It cannot be a taxi.  Come accompanied by an adult that can drive you back, and that is strong enough to help you if your legs get weak or numb from the local anesthetic.  Take all of your medicines the morning of the procedure with just enough water to swallow them.  If you have diabetes, make sure that you are scheduled to have your procedure done first thing in the morning,  whenever possible.  If you have diabetes, take only half of your insulin dose and notify our nurse that you have done so as soon as you arrive at the clinic.  If you are diabetic, but only take blood sugar pills (oral hypoglycemic), then do not take them on the morning of your procedure.  You may take them after you have had the procedure.  Do not take aspirin or any aspirin-containing medications, at least eleven (11) days prior to the procedure.  They may prolong bleeding.  Wear loose fitting clothing that may be easy to take off and that you would not mind if it got stained with Betadine or blood.  Do not wear any jewelry or perfume  Remove any nail coloring.  It will interfere with some of our monitoring equipment.  NOTE: Remember that this is  not meant to be interpreted as a complete list of all possible complications.  Unforeseen problems may occur.  BLOOD THINNERS The following drugs contain aspirin or other products, which can cause increased bleeding during surgery and should not be taken for 2 weeks prior to and 1 week after surgery.  If you should need take something for relief of minor pain, you may take acetaminophen which is found in Tylenol,m Datril, Anacin-3 and Panadol. It is not blood thinner. The products listed below are.  Do not take any of the products listed below in addition to any listed on your instruction sheet.  A.P.C or A.P.C with Codeine Codeine Phosphate Capsules #3 Ibuprofen Ridaura  ABC compound Congesprin Imuran rimadil  Advil Cope Indocin Robaxisal  Alka-Seltzer Effervescent Pain Reliever and Antacid Coricidin or Coricidin-D  Indomethacin Rufen  Alka-Seltzer plus Cold Medicine Cosprin Ketoprofen S-A-C Tablets  Anacin Analgesic Tablets or Capsules Coumadin Korlgesic Salflex  Anacin Extra Strength Analgesic tablets or capsules CP-2 Tablets Lanoril Salicylate  Anaprox Cuprimine Capsules Levenox Salocol  Anexsia-D Dalteparin Magan Salsalate  Anodynos Darvon  compound Magnesium Salicylate Sine-off  Ansaid Dasin Capsules Magsal Sodium Salicylate  Anturane Depen Capsules Marnal Soma  APF Arthritis pain formula Dewitt's Pills Measurin Stanback  Argesic Dia-Gesic Meclofenamic Sulfinpyrazone  Arthritis Bayer Timed Release Aspirin Diclofenac Meclomen Sulindac  Arthritis pain formula Anacin Dicumarol Medipren Supac  Analgesic (Safety coated) Arthralgen Diffunasal Mefanamic Suprofen  Arthritis Strength Bufferin Dihydrocodeine Mepro Compound Suprol  Arthropan liquid Dopirydamole Methcarbomol with Aspirin Synalgos  ASA tablets/Enseals Disalcid Micrainin Tagament  Ascriptin Doan's Midol Talwin  Ascriptin A/D Dolene Mobidin Tanderil  Ascriptin Extra Strength Dolobid Moblgesic Ticlid  Ascriptin with Codeine Doloprin or Doloprin with Codeine Momentum Tolectin  Asperbuf Duoprin Mono-gesic Trendar  Aspergum Duradyne Motrin or Motrin IB Triminicin  Aspirin plain, buffered or enteric coated Durasal Myochrisine Trigesic  Aspirin Suppositories Easprin Nalfon Trillsate  Aspirin with Codeine Ecotrin Regular or Extra Strength Naprosyn Uracel  Atromid-S Efficin Naproxen Ursinus  Auranofin Capsules Elmiron Neocylate Vanquish  Axotal Emagrin Norgesic Verin  Azathioprine Empirin or Empirin with Codeine Normiflo Vitamin E  Azolid Emprazil Nuprin Voltaren  Bayer Aspirin plain, buffered or children's or timed BC Tablets or powders Encaprin Orgaran Warfarin Sodium  Buff-a-Comp Enoxaparin Orudis Zorpin  Buff-a-Comp with Codeine Equegesic Os-Cal-Gesic   Buffaprin Excedrin plain, buffered or Extra Strength Oxalid   Bufferin Arthritis Strength Feldene Oxphenbutazone   Bufferin plain or Extra Strength Feldene Capsules Oxycodone with Aspirin   Bufferin with Codeine Fenoprofen Fenoprofen Pabalate or Pabalate-SF   Buffets II Flogesic Panagesic   Buffinol plain or Extra Strength Florinal or Florinal with Codeine Panwarfarin   Buf-Tabs Flurbiprofen Penicillamine   Butalbital  Compound Four-way cold tablets Penicillin   Butazolidin Fragmin Pepto-Bismol   Carbenicillin Geminisyn Percodan   Carna Arthritis Reliever Geopen Persantine   Carprofen Gold's salt Persistin   Chloramphenicol Goody's Phenylbutazone   Chloromycetin Haltrain Piroxlcam   Clmetidine heparin Plaquenil   Cllnoril Hyco-pap Ponstel   Clofibrate Hydroxy chloroquine Propoxyphen         Before stopping any of these medications, be sure to consult the physician who ordered them.  Some, such as Coumadin (Warfarin) are ordered to prevent or treat serious conditions such as "deep thrombosis", "pumonary embolisms", and other heart problems.  The amount of time that you may need off of the medication may also vary with the medication and the reason for which you were taking it.  If you are taking any of these medications, please make sure  you notify your pain physician before you undergo any procedures.         Intercostal Nerve Block Patient Information  Description: The twelve intercostal nerves arise from the first thru twelfth thoracic nerve roots.  The nerve begins at the spine and wraps around the body, lying in a groove underneath each rib.  Each intercostal nerve innervates a specific strip of skin and body walk of the abdomen and chest.  Therefore, injuries of the chest wall or abdominal wall result in pain that is transmitted back to the brian via the intercostal nerves.  Examples of such injuries include rib fractures and incisions for lung and gall bladder surgery.  Occasionally, pain may persist long after an injury or surgical incision secondary to inflammation and irritation of the intercostal nerve.  The longstanding pain is known as intercostal neuralgia.  An intercostal nerve block is preformed to eliminate pain either temporarily or permanently.  A small needle is placed below the rib and local anesthetic (like Novocaine) and possibly steroid is injected.  Usually 2-4 intercostal nerves  are blocked at a time depending on the problem.  The patient will experience a slight "pin-prick" sensation for each injection.  Shortly thereafter, the strip of skin that is innervated by the blocked intercostal nerve will feel numb.  Persistent pain that is only temporarily relieved with local anesthetic may require a more permanent block. This procedure is called Cryoneurolysis and entails placing a small probe beneath the rib to freeze the nerve.  Conditions that may be treated by intercostal nerve blocks:   Rib fractures  Longstanding pain from surgery of the chest or abdomen (intercostal neuralgia)  Pain from chest tubes  Pain from trauma to the chest  Preparation for the injections:  1. Do not eat any solid food or dairy products within 8 hours of your appointment. 2. You may drink clear liquids up to 3 hours before appointment.  Clear liquids include water, black coffee, juice or soda.  No milk or cream please. 3. You may take your regular medication, including pain medications, with a sip of water before your appointment.  Diabetics should hold regular insulin (if take separately) and take 1/2 normal NPH dose the morning of the procedure.   Carry some sugar containing items with you to your appointment. 4. A driver must accompany you and be prepared to drive you home after your procedure. 5. Bring all your current medications with you. 6. An IV may be inserted and sedation may be given at the discretion of the physician. 7. A blood pressure cuff, EKG and other monitors will often be applied during the procedure.  Some patients may need to have extra oxygen administered for a short period. 8. You will be asked to provide medical information, including your allergies, prior to the procedure.  We must know immediately if you are taking blood thinners (like Coumadin/Warfarin) or if you are allergic to IV iodine contrast (dye). We must know if you could possible be pregnant.  Possible  side-effects:   Bleeding from needle site  Infection (rare)  Nerve injury (rare)  Numbness & tingling of skin  Collapsed lung requiring chest tube (rare)  Local anesthetic toxicity (rare)  Light-headedness (temporary)  Pain at injection site (several days)  Decreased blood pressure (temporary)  Shortness of breath  Jittery/shaking sensation (temporary)  Call if you experience:   Difficulty breathing or hives (go directly to the emergency room)  Redness, inflammation or drainage at the injection site  Severe pain at the site of the injection  Any new symptoms which are concerning   Please note:  Your pain may subside immediately but may return several hours after the injection.  Often, more than one injection is required to reduce the pain. Also, if several temporary blocks with local anesthetic are ineffective, a more permanent block with cryolysis may be necessary.  This will be discussed with you should this be the case.  If you have any questions, please call [phone] [facility]

## 2015-07-19 ENCOUNTER — Other Ambulatory Visit: Payer: Self-pay | Admitting: Pain Medicine

## 2015-07-27 ENCOUNTER — Encounter: Payer: Self-pay | Admitting: Pain Medicine

## 2015-07-27 ENCOUNTER — Ambulatory Visit: Payer: Medicare Other | Attending: Pain Medicine | Admitting: Pain Medicine

## 2015-07-27 ENCOUNTER — Other Ambulatory Visit: Payer: Self-pay | Admitting: Pain Medicine

## 2015-07-27 DIAGNOSIS — B0229 Other postherpetic nervous system involvement: Secondary | ICD-10-CM

## 2015-07-27 DIAGNOSIS — M5134 Other intervertebral disc degeneration, thoracic region: Secondary | ICD-10-CM

## 2015-07-27 DIAGNOSIS — M546 Pain in thoracic spine: Secondary | ICD-10-CM | POA: Insufficient documentation

## 2015-07-27 DIAGNOSIS — R0782 Intercostal pain: Secondary | ICD-10-CM | POA: Diagnosis not present

## 2015-07-27 MED ORDER — FENTANYL CITRATE (PF) 100 MCG/2ML IJ SOLN
100.0000 ug | Freq: Once | INTRAMUSCULAR | Status: AC
  Administered 2015-07-27: 100 ug via INTRAVENOUS
  Filled 2015-07-27: qty 2

## 2015-07-27 MED ORDER — LACTATED RINGERS IV SOLN: 1000.0000 mL | INTRAVENOUS | Status: DC

## 2015-07-27 MED ORDER — ORPHENADRINE CITRATE 30 MG/ML IJ SOLN
60.0000 mg | Freq: Once | INTRAMUSCULAR | Status: DC
  Filled 2015-07-27: qty 2

## 2015-07-27 MED ORDER — MIDAZOLAM HCL 5 MG/5ML IJ SOLN
5.0000 mg | Freq: Once | INTRAMUSCULAR | Status: AC
  Administered 2015-07-27: 2 mg via INTRAVENOUS
  Filled 2015-07-27: qty 5

## 2015-07-27 MED ORDER — BUPIVACAINE HCL (PF) 0.5 % IJ SOLN
30.0000 mL | Freq: Once | INTRAMUSCULAR | Status: AC
  Administered 2015-07-27: 30 mL
  Filled 2015-07-27: qty 30

## 2015-07-27 MED ORDER — TRIAMCINOLONE ACETONIDE 40 MG/ML IJ SUSP
40.0000 mg | Freq: Once | INTRAMUSCULAR | Status: AC
  Administered 2015-07-27: 40 mg
  Filled 2015-07-27: qty 1

## 2015-07-27 NOTE — Progress Notes (Signed)
   Subjective:    Patient ID: Timothy Knapp, male    DOB: [birth year], 75 y.o.   MRN: [id number]  HPI  PROCEDURE PERFORMED:  Intercostal nerve block.  HISTORY OF PRESENT ILLNESS:  The patient is a 75 y.o. male who returns to the [facility] for further evaluation and treatment of pain involving the midportion of the back. There is concern regarding the patient's pain being due to significant component of Postherpetic neuralgia of the thoracic region. The patient is with significant component of intercostal neuralgia The risks, benefits, and expectations of the procedure have been discussed and explained to the patient who was understanding and in agreement with suggested treatment plan. We will proceed with interventional treatment as discussed.   DESCRIPTION OF PROCEDURE: Intercostal nerve block with IV Versed, IV fentanyl conscious sedation, EKG, blood pressure, pulse, capnography, and pulse oximetry monitoring. The procedure was performed with the patient in the prone position under fluoroscopic guidance.   Intercostal nerve block, right side: With the patient in the prone position, Betadine prep of proposed entry site was performed under fluoroscopic guidance with AP view of the thoracic spine. Under fluoroscopic guidance, a 22 -gauge needle was inserted to contact bone of the 11th rib on the right side after which the needle was repositioned at the inferior border of the 11th rib on the right side under fluoroscopic guidance. Following documentation of needle placement, at the inferior border of the 11th rib on the right side and negative aspiration, a total of 3 mL of 0.50% bupivacaine with Kenalog was injected for right side for 11 rib intercostal nerve block.   INTERCOSTAL NERVE BLOCKS AT T10, T9, T8, T7 and T6 LEVELS: The procedure was performed at these levels as was performed at the previous level, T 11, utilizing the same technique and under fluoroscopic guidance  Myoneural  block injections of the thoracic region Following Betadine prep of proposed entry site a 22-gauge needle was inserted into the thoracic region and under fluoroscopic guidance a total of 80 cc of-year-old 0.5% bupivacaine with Norflex was injected for myoneural block injection of the thoracic region times two.  The patient tolerated procedure well  A total of 10 mg Kenalog was utilized for the procedure.   PLAN:   1. Medications: We will continue presently prescribed medications Neurontin Cymbalta Elavil oxycodone vitamin B complex and Lidoderm patch. 2. The patient is to follow up with primary care physician Dr. Jeananne Rama for further evaluation of blood pressure and general medical condition as discussed. 3. Surgical evaluation. Has been addressed 4. Neurological evaluation. Has been addressed 5. May consider the patient for additional studies pending response to treatment and follow-up evaluation.       Evaluation at [facility] has been addressed 6. May consider radiofrequency procedures, implantation type procedures and other treatment pending response to treatment and follow-up evaluation. 7. The patient has been advised to adhere to proper body mechanics and to call the [facility] prior to scheduled return appointment should there be significant change in condition or have other concerns regarding condition prior to scheduled return appointment.  The patient is understanding and in agreement with suggested treatment plan.    Review of Systems     Objective:   Physical Exam        Assessment & Plan:

## 2015-07-27 NOTE — Progress Notes (Signed)
Safety precautions to be maintained throughout the outpatient stay will include: orient to surroundings, keep bed in low position, maintain call bell within reach at all times, provide assistance with transfer out of bed and ambulation.  

## 2015-07-27 NOTE — Patient Instructions (Addendum)
PLAN   Continue present medication Neurontin Cymbalta Elavil vitamin B complex Lidoderm patch and oxycodone .   F/U PCP Dr. Jeananne Rama  for evaluation of  BP and general medical  condition.   F/U surgical evaluation Patient is without plans for surgical evaluation at this time  Discuss pain of knees with surgeon as we previously discussed  TENS unit for thoracic pain due to postherpetic neuralgia.   F/U neurological evaluation. May consider pending follow-up evaluations  May consider radiofrequency rhizolysis or intraspinal procedures pending response to present treatment and F/U evaluation   Patient to call [facility] should patient have concerns prior to scheduled return appointment  Intercostal Nerve Block Patient Information  Description: The twelve intercostal nerves arise from the first thru twelfth thoracic nerve roots.  The nerve begins at the spine and wraps around the body, lying in a groove underneath each rib.  Each intercostal nerve innervates a specific strip of skin and body walk of the abdomen and chest.  Therefore, injuries of the chest wall or abdominal wall result in pain that is transmitted back to the brian via the intercostal nerves.  Examples of such injuries include rib fractures and incisions for lung and gall bladder surgery.  Occasionally, pain may persist long after an injury or surgical incision secondary to inflammation and irritation of the intercostal nerve.  The longstanding pain is known as intercostal neuralgia.  An intercostal nerve block is preformed to eliminate pain either temporarily or permanently.  A small needle is placed below the rib and local anesthetic (like Novocaine) and possibly steroid is injected.  Usually 2-4 intercostal nerves are blocked at a time depending on the problem.  The patient will experience a slight "pin-prick" sensation for each injection.  Shortly thereafter, the strip of skin that is innervated by the blocked  intercostal nerve will feel numb.  Persistent pain that is only temporarily relieved with local anesthetic may require a more permanent block. This procedure is called Cryoneurolysis and entails placing a small probe beneath the rib to freeze the nerve.  Conditions that may be treated by intercostal nerve blocks:   Rib fractures  Longstanding pain from surgery of the chest or abdomen (intercostal neuralgia)  Pain from chest tubes  Pain from trauma to the chest  Preparation for the injections:  1. Do not eat any solid food or dairy products within 8 hours of your appointment. 2. You may drink clear liquids up to 3 hours before appointment.  Clear liquids include water, black coffee, juice or soda.  No milk or cream please. 3. You may take your regular medication, including pain medications, with a sip of water before your appointment.  Diabetics should hold regular insulin (if take separately) and take 1/2 normal NPH dose the morning of the procedure.   Carry some sugar containing items with you to your appointment. 4. A driver must accompany you and be prepared to drive you home after your procedure. 5. Bring all your current medications with you. 6. An IV may be inserted and sedation may be given at the discretion of the physician. 7. A blood pressure cuff, EKG and other monitors will often be applied during the procedure.  Some patients may need to have extra oxygen administered for a short period. 8. You will be asked to provide medical information, including your allergies, prior to the procedure.  We must know immediately if you are taking blood thinners (like Coumadin/Warfarin) or if you are allergic to IV iodine  contrast (dye). We must know if you could possible be pregnant.  Possible side-effects:   Bleeding from needle site  Infection (rare)  Nerve injury (rare)  Numbness & tingling of skin  Collapsed lung requiring chest tube (rare)  Local anesthetic toxicity  (rare)  Light-headedness (temporary)  Pain at injection site (several days)  Decreased blood pressure (temporary)  Shortness of breath  Jittery/shaking sensation (temporary)  Call if you experience:   Difficulty breathing or hives (go directly to the emergency room)  Redness, inflammation or drainage at the injection site  Severe pain at the site of the injection  Any new symptoms which are concerning   Please note:  Your pain may subside immediately but may return several hours after the injection.  Often, more than one injection is required to reduce the pain. Also, if several temporary blocks with local anesthetic are ineffective, a more permanent block with cryolysis may be necessary.  This will be discussed with you should this be the case.  If you have any questions, please call [phone] [facility]   Be careful moving about. Muscle spasms in the area of the injection may occur.  Use ice for the next 24 hours (15 minutes on, 15 minutes off).  After 24 hours, you may use heat for comfort if you wish.  Post procedure numbness or redness is expected, average 4-6 hours.  If numbness develops after 4-6 hours and is felt to be progressing and worsening, immediately contact your physician.

## 2015-07-28 ENCOUNTER — Telehealth: Payer: Self-pay | Admitting: *Deleted

## 2015-07-28 NOTE — Telephone Encounter (Signed)
No answer with post procedure phone call. 

## 2015-08-01 ENCOUNTER — Telehealth: Payer: Self-pay

## 2015-08-01 NOTE — Telephone Encounter (Signed)
Pt says Dr Primus Bravo was supposed to fill Cymbalta but did not. Pt would like for this to be done

## 2015-08-04 ENCOUNTER — Telehealth: Payer: Self-pay | Admitting: *Deleted

## 2015-08-08 ENCOUNTER — Telehealth: Payer: Self-pay | Admitting: *Deleted

## 2015-08-08 NOTE — Telephone Encounter (Signed)
Timothy Knapp called stating that he needs another Rx for his Cymbalta 30 mg 1 tablet po qd if tolerated.  Called to his mail order pharmacy and they stated that he last filled on 07/14/15 and it was due to fill again on 08/04/15 but he needs a new Rx.  This Rx will need to be escribed to [name].

## 2015-08-08 NOTE — Telephone Encounter (Signed)
See phone call, routed to Dr Primus Bravo.

## 2015-08-09 ENCOUNTER — Telehealth: Payer: Self-pay | Admitting: *Deleted

## 2015-08-09 ENCOUNTER — Other Ambulatory Visit: Payer: Self-pay | Admitting: Pain Medicine

## 2015-08-09 MED ORDER — DULOXETINE HCL 30 MG PO CPEP: ORAL_CAPSULE | ORAL | 0 refills | Status: DC

## 2015-08-09 NOTE — Telephone Encounter (Signed)
Cymbalta 30 mg 1 tablet by mouth per if tolerated called to [facility].

## 2015-08-09 NOTE — Telephone Encounter (Signed)
Timothy Knapp called stating that he needs another Rx for his Cymbalta 30 mg 1 tablet po qd if tolerated.  Called to his mail order pharmacy and they stated that he last filled on 07/14/15 and it was due to fill again on 08/04/15 but he needs a new Rx.  This Rx will need to be escribed to [name].

## 2015-08-09 NOTE — Telephone Encounter (Signed)
Nurses I printed the Cymbalta prescription. Please let me know if I can assist with this any further  Thank you

## 2015-08-09 NOTE — Telephone Encounter (Signed)
Voicemail left with Mr Sirbaugh to let him know that his Cymbalta has been called to [facility].

## 2015-08-18 ENCOUNTER — Other Ambulatory Visit: Payer: Self-pay | Admitting: Pain Medicine

## 2015-08-22 ENCOUNTER — Telehealth: Payer: Self-pay | Admitting: Pain Medicine

## 2015-08-22 NOTE — Telephone Encounter (Signed)
Nurses Please discuss patient's oxycodone refill with me so that I can provide patient with prescription as needed  Thank you

## 2015-08-22 NOTE — Telephone Encounter (Signed)
Needs med refill please call asap

## 2015-08-22 NOTE — Telephone Encounter (Signed)
Spoke with patient, states that he needs an Rx for oxycodone 5 mg so that he can refill.  Patient states that he received the last Rx for this Oxycodone 5 mg and he mails into his Mail order pharmacy, he received the last Rx from them on 06/25/15 and is now in need of another renewal Rx.  He states his next appt is on 08/30/15.  I did tell Timothy Knapp that typically we do not renew pain medication without an appt but he states that you will.  Told patient that I would give Dr Primus Bravo the message.

## 2015-08-22 NOTE — Telephone Encounter (Signed)
Spoke with Dr Primus Bravo, Mr Bixler will need evaluation appt to get Rx. On tomorrow. Asked front desk to please call Mr Cordray to schedule appt.

## 2015-08-23 ENCOUNTER — Ambulatory Visit: Payer: Medicare Other | Attending: Pain Medicine | Admitting: Pain Medicine

## 2015-08-23 ENCOUNTER — Encounter: Payer: Self-pay | Admitting: Pain Medicine

## 2015-08-23 VITALS — BP 130/69 | HR 64 | Temp 98.2°F | Resp 16 | Ht 67.0 in | Wt 155.0 lb

## 2015-08-23 DIAGNOSIS — R0782 Intercostal pain: Secondary | ICD-10-CM | POA: Diagnosis not present

## 2015-08-23 DIAGNOSIS — M170 Bilateral primary osteoarthritis of knee: Secondary | ICD-10-CM | POA: Insufficient documentation

## 2015-08-23 DIAGNOSIS — M5416 Radiculopathy, lumbar region: Secondary | ICD-10-CM | POA: Diagnosis not present

## 2015-08-23 DIAGNOSIS — M47817 Spondylosis without myelopathy or radiculopathy, lumbosacral region: Secondary | ICD-10-CM | POA: Diagnosis not present

## 2015-08-23 DIAGNOSIS — M5134 Other intervertebral disc degeneration, thoracic region: Secondary | ICD-10-CM

## 2015-08-23 DIAGNOSIS — M791 Myalgia: Secondary | ICD-10-CM | POA: Diagnosis not present

## 2015-08-23 DIAGNOSIS — B0229 Other postherpetic nervous system involvement: Secondary | ICD-10-CM | POA: Insufficient documentation

## 2015-08-23 DIAGNOSIS — M792 Neuralgia and neuritis, unspecified: Secondary | ICD-10-CM | POA: Insufficient documentation

## 2015-08-23 DIAGNOSIS — M546 Pain in thoracic spine: Secondary | ICD-10-CM | POA: Diagnosis present

## 2015-08-23 MED ORDER — DULOXETINE HCL 30 MG PO CPEP: ORAL_CAPSULE | ORAL | 0 refills | Status: DC

## 2015-08-23 MED ORDER — OXYCODONE HCL 5 MG PO TABS: ORAL_TABLET | ORAL | 0 refills | Status: DC

## 2015-08-23 MED ORDER — GABAPENTIN 800 MG PO TABS: ORAL_TABLET | ORAL | 2 refills | Status: DC

## 2015-08-23 MED ORDER — AMITRIPTYLINE HCL 10 MG PO TABS: ORAL_TABLET | ORAL | 0 refills | Status: DC

## 2015-08-23 NOTE — Patient Instructions (Addendum)
PLAN   Continue present medication Neurontin Cymbalta Elavil vitamin B complex Lidoderm patch and oxycodone . May take 3 Elavil (amitriptyline)   per day as discussed  Intercostal nerve blocks to be performed at time of return appointment  F/U PCP Dr. Jeananne Rama  for evaliation of  BP and general medical  condition.   F/U surgical evaluation Patient is without plans for surgical evaluation at this time  Discuss pain of knees with surgeon as we previously discussed  TENS unit for thoracic pain due to postherpetic neuralgia.   F/U neurological evaluation. May consider pending follow-up evaluations  Evaluation at [place] to be considered as discussed  May consider radiofrequency rhizolysis or intraspinal procedures pending response to present treatment and F/U evaluation   Patient to call [facility] should patient have concerns prior to scheduled return appointment.

## 2015-08-23 NOTE — Progress Notes (Signed)
     The patient is a 75 year old gentleman who returns to pain management for further evaluation and treatment of pain involving the right thoracic region with pain felt to be due to postherpetic neuralgia. The patient denies any trauma change in events of daily living the call significant change in symptomatology and states that he has had significant improvement of his pain with interventional treatment as well as modification of medications in pain management. At the present time patient has return of pain and wishes to undergo interventional treatment at time return appointment at which time we will proceed with intercostal nerve blocks of the right thoracic region. The patient was in agreement with suggested treatment plan. We will continue presently prescribed medications as discussed. All agreed to suggested treatment plan.    Physical examination  There was tends to palpation of paraspinal muscular treat the cervical region cervical facet region palpation which reproduces mild discomfort. There was moderate tenderness over the paraspinal muscular treat the thoracic region on the right compared to the left. No crepitus of the thoracic region was noted. Palpation of the mid thoracic region was and lower thoracic region was with moderate to moderately severe discomfort with moderate increase of pain with grasping skin between the digits. There was tenderness over the region of the lumbar paraspinal musculature region lumbar facet region of minimal degree with lateral bending rotation extension and palpation of the lumbar facets reproducing minimal discomfort. Straight leg raise was tolerated to 30 without increased pain with dorsiflexion noted. DTRs trace at the knees. Reevaluation of the knee was with mild tenderness to palpation with negative anterior and posterior drawer signs without ballottement of the patella. There was no sensory deficit or dermatomal distribution of the lower extremities  noted. There was negative clonus negative Homans. Abdomen was without excessive tends to palpation and no costovertebral tenderness noted.    Assessment   Postherpetic neuralgia  Intercostal neuralgia  Degenerative joint disease of knees     PLAN   Continue present medication Neurontin Cymbalta Elavil vitamin B complex Lidoderm patch and oxycodone . May take 3 Elavil (amitriptyline)   per day as discussed  Intercostal nerve blocks to be performed at time of return appointment  F/U PCP Dr. Jeananne Rama  for evaliation of  BP and general medical  condition.   F/U surgical evaluation Patient is without plans for surgical evaluation at this time  Discuss pain of knees with surgeon as we previously discussed  TENS unit for thoracic pain due to postherpetic neuralgia.   F/U neurological evaluation. May consider pending follow-up evaluations  Evaluation at [facility] to be considered as discussed  May consider radiofrequency rhizolysis or intraspinal procedures pending response to present treatment and F/U evaluation   Patient to call [facility] Center should patient have concerns prior to scheduled return appointment.

## 2015-08-23 NOTE — Progress Notes (Signed)
Safety precautions to be maintained throughout the outpatient stay will include: orient to surroundings, keep bed in low position, maintain call bell within reach at all times, provide assistance with transfer out of bed and ambulation.  

## 2015-08-29 ENCOUNTER — Other Ambulatory Visit: Payer: Self-pay | Admitting: Pain Medicine

## 2015-08-29 NOTE — Telephone Encounter (Signed)
Patient is still waiting on meds from mail in order. Will not be here for 3 more days. Wanted to see if Dr. Primus Bravo will write script for patient for 18 pills that he could pick up today to do him until the script order comes in.

## 2015-08-29 NOTE — Telephone Encounter (Signed)
Dr. Crisp please advise. 

## 2015-08-29 NOTE — Telephone Encounter (Signed)
Attempted to call patient, message left. 

## 2015-08-29 NOTE — Telephone Encounter (Signed)
Dena and Nurses Please clarify what the issue is regarding the patient's medication so that we can solve  the issue to the satisfaction of the patient   Thank you

## 2015-08-30 ENCOUNTER — Other Ambulatory Visit: Payer: Self-pay | Admitting: Pain Medicine

## 2015-08-30 ENCOUNTER — Ambulatory Visit: Payer: Medicare Other | Admitting: Pain Medicine

## 2015-08-30 MED ORDER — OXYCODONE HCL 5 MG PO TABS: ORAL_TABLET | ORAL | 0 refills | Status: DC

## 2015-08-30 NOTE — Telephone Encounter (Signed)
Attempted to call patient, message left. 

## 2015-08-30 NOTE — Telephone Encounter (Signed)
Has not received delivery of Oxycodone 5 mg. Expects it tomorrow or the day after. Requesting a 3-day supply of Oxycodone 5mg . Dr. Primus Bravo agrees to do this. Patient notified.

## 2015-09-06 ENCOUNTER — Other Ambulatory Visit: Payer: Self-pay | Admitting: Pain Medicine

## 2015-09-19 ENCOUNTER — Ambulatory Visit: Payer: Medicare Other | Attending: Pain Medicine | Admitting: Pain Medicine

## 2015-09-19 ENCOUNTER — Encounter: Payer: Self-pay | Admitting: Pain Medicine

## 2015-09-19 VITALS — BP 128/73 | HR 56 | Temp 97.9°F | Resp 16 | Ht 68.0 in | Wt 155.0 lb

## 2015-09-19 DIAGNOSIS — M47817 Spondylosis without myelopathy or radiculopathy, lumbosacral region: Secondary | ICD-10-CM | POA: Diagnosis not present

## 2015-09-19 DIAGNOSIS — M5134 Other intervertebral disc degeneration, thoracic region: Secondary | ICD-10-CM

## 2015-09-19 DIAGNOSIS — M5416 Radiculopathy, lumbar region: Secondary | ICD-10-CM | POA: Diagnosis not present

## 2015-09-19 DIAGNOSIS — G588 Other specified mononeuropathies: Secondary | ICD-10-CM | POA: Insufficient documentation

## 2015-09-19 DIAGNOSIS — B0229 Other postherpetic nervous system involvement: Secondary | ICD-10-CM | POA: Diagnosis not present

## 2015-09-19 DIAGNOSIS — M791 Myalgia: Secondary | ICD-10-CM | POA: Diagnosis not present

## 2015-09-19 DIAGNOSIS — M170 Bilateral primary osteoarthritis of knee: Secondary | ICD-10-CM | POA: Diagnosis not present

## 2015-09-19 DIAGNOSIS — R0782 Intercostal pain: Secondary | ICD-10-CM | POA: Diagnosis not present

## 2015-09-19 MED ORDER — LIDOCAINE 5 % EX PTCH: MEDICATED_PATCH | CUTANEOUS | 0 refills | Status: DC

## 2015-09-19 MED ORDER — AMITRIPTYLINE HCL 10 MG PO TABS: ORAL_TABLET | ORAL | 0 refills | Status: DC

## 2015-09-19 MED ORDER — OXYCODONE HCL 5 MG PO TABS: ORAL_TABLET | ORAL | 0 refills | Status: DC

## 2015-09-19 MED ORDER — DULOXETINE HCL 30 MG PO CPEP: ORAL_CAPSULE | ORAL | 0 refills | Status: DC

## 2015-09-19 MED ORDER — GABAPENTIN 800 MG PO TABS: ORAL_TABLET | ORAL | 2 refills | Status: DC

## 2015-09-19 NOTE — Patient Instructions (Addendum)
PLAN   Continue present medication Neurontin Cymbalta Elavil vitamin B complex Lidoderm patch and oxycodone . May take 3 Elavil (amitriptyline)   per day as discussed  F/U PCP Dr. Jeananne Rama  for evaliation of  BP and general medical  condition.   F/U surgical evaluation Patient is without plans for surgical evaluation at this time  Discuss pain of knees with surgeon as we previously discussed  TENS unit for thoracic pain due to postherpetic neuralgia.   F/U neurological evaluation. May consider pending follow-up evaluations  Evaluation at [facility] as scheduled  May consider radiofrequency rhizolysis or intraspinal procedures pending response to present treatment and F/U evaluation   Patient to call Pain Management Center should patient have concerns prior to scheduled return appointment.

## 2015-09-19 NOTE — Progress Notes (Signed)
Safety precautions to be maintained throughout the outpatient stay will include: orient to surroundings, keep bed in low position, maintain call bell within reach at all times, provide assistance with transfer out of bed and ambulation.  

## 2015-09-20 NOTE — Progress Notes (Signed)
      Patient is 75 year old gentleman who returns to pain management for further evaluation and treatment of pain involving the thoracic region on the right. Patient is with prior shingles in pain is felt to be due to postherpetic neuralgia. Patient is undergone intercostal nerve blocks and local infiltration of the area of most severe pain with significant improvement of pain. We discussed patient undergoing evaluation at [facility] for further assessment recommend patient regarding patient treatment. All agreed to suggested treatment plan     Physical examination  There was tenderness of the splenius capitis and occipitalis region palpation which reproduces mild discomfort. Palpation of the thoracic region was attends to palpation of moderate severe degree on the right mid thoracic region and lumbar paraspinal musculature region. There was bilateral equal grip strength without increased pain with Tinel and Phalen's maneuver. Palpation of the acromioclavicular and glenohumeral joint regions reproduces minimal discomfort and patient perform drop test with minimal difficulty. Palpation over the thoracic region on the right was attends to palpation with some increased pain with grasping skin between the digits. No new lesions of the skin were noted. Palpation of the lumbar paraspinal must reason lumbar facet region was with mild tenderness to palpation of the knees with negative anterior and posterior drawer signs without ballottement of the patella with crepitus of the knees with no evidence of abnormalities consisting of significant degree. There was unremarkable anterior and posterior drawer signs. Abdomen nontender and no costovertebral tenderness noted  Assessment     Postherpetic neuralgia  Intercostal neuralgia  Degenerative joint disease of knees     PLAN   Continue present medication Neurontin Cymbalta Elavil vitamin B complex Lidoderm patch and oxycodone . May take  3 Elavil (amitriptyline)   per day as discussed  F/U PCP Dr. Jeananne Rama  for evaliation of  BP and general medical  condition.   F/U surgical evaluation Patient is without plans for surgical evaluation at this time  Discuss pain of knees with surgeon as we previously discussed  TENS unit for thoracic pain due to postherpetic neuralgia.   F/U neurological evaluation. May consider pending follow-up evaluations  Evaluation at [facility] as scheduled  May consider radiofrequency rhizolysis or intraspinal procedures pending response to present treatment and F/U evaluation   Patient to call Pain Management Center should patient have concerns prior to scheduled return appointment.

## 2015-09-21 DIAGNOSIS — M545 Low back pain: Secondary | ICD-10-CM | POA: Diagnosis not present

## 2015-09-21 DIAGNOSIS — B0229 Other postherpetic nervous system involvement: Secondary | ICD-10-CM | POA: Diagnosis not present

## 2015-09-28 ENCOUNTER — Telehealth: Payer: Self-pay | Admitting: *Deleted

## 2015-09-28 ENCOUNTER — Ambulatory Visit (INDEPENDENT_AMBULATORY_CARE_PROVIDER_SITE_OTHER): Payer: Medicare Other | Admitting: Podiatry

## 2015-09-28 ENCOUNTER — Encounter: Payer: Self-pay | Admitting: Podiatry

## 2015-09-28 ENCOUNTER — Ambulatory Visit (INDEPENDENT_AMBULATORY_CARE_PROVIDER_SITE_OTHER): Payer: Medicare Other

## 2015-09-28 VITALS — BP 154/84 | HR 74 | Resp 16

## 2015-09-28 DIAGNOSIS — M779 Enthesopathy, unspecified: Secondary | ICD-10-CM | POA: Diagnosis not present

## 2015-09-28 MED ORDER — METHYLPREDNISOLONE 4 MG PO TBPK: ORAL_TABLET | ORAL | 0 refills | Status: DC

## 2015-09-28 NOTE — Progress Notes (Signed)
He presents today after having not seen him for the past 2-3 years. He states that he is having pain in the forefoot right particularly while playing sports. He states this really been aching for the past 2 weeks and he states that he's been rolling it on a foot roller which has helped to some degree. I have reviewed his past medical history medications and allergies.  Objective: Vital signs are stable alert and oriented 3. Pulses are palpable. Neurologic sensorium is intact deep tendon reflexes are intact. Orthopedic evaluation demonstrates pain on end range of motion of the second metatarsophalangeal joint of the right foot with mild hammertoe deformity. Mild hallux valgus deformity is present as well. Radiographs taken today demonstrate no major osseous abnormalities in the area area and no fractures are identified. He does have hallux limitus with joint space narrowing so-called sclerosis and dorsal spurring.  Assessment: Capsulitis second metatarsophalangeal joint hallux limitus right foot.  Plan: We discussed in great detail today the capsulitis that is possibly associated with hallux limitus. Capsulitis second metatarsophalangeal joint right foot. Was injected today with Kenalog and local anesthetic particularly. I placed him on a steroid Dosepak discussed appropriate shoe gear stretching her size and ice therapy.

## 2015-09-28 NOTE — Telephone Encounter (Addendum)
Pt seen today in [place] office and rx should have been sent to [facility] [place]. Changed pharmacy to pt's requested pharmacy and ordered.

## 2015-10-14 DIAGNOSIS — B0223 Postherpetic polyneuropathy: Secondary | ICD-10-CM | POA: Diagnosis not present

## 2015-10-24 DIAGNOSIS — L821 Other seborrheic keratosis: Secondary | ICD-10-CM | POA: Diagnosis not present

## 2015-10-24 DIAGNOSIS — L570 Actinic keratosis: Secondary | ICD-10-CM | POA: Diagnosis not present

## 2015-10-24 DIAGNOSIS — C44319 Basal cell carcinoma of skin of other parts of face: Secondary | ICD-10-CM | POA: Diagnosis not present

## 2015-10-24 DIAGNOSIS — Z85828 Personal history of other malignant neoplasm of skin: Secondary | ICD-10-CM | POA: Diagnosis not present

## 2015-10-24 DIAGNOSIS — D485 Neoplasm of uncertain behavior of skin: Secondary | ICD-10-CM | POA: Diagnosis not present

## 2015-11-07 ENCOUNTER — Other Ambulatory Visit: Payer: Self-pay | Admitting: Family Medicine

## 2015-11-07 DIAGNOSIS — B0229 Other postherpetic nervous system involvement: Secondary | ICD-10-CM | POA: Diagnosis not present

## 2015-11-07 DIAGNOSIS — B0223 Postherpetic polyneuropathy: Secondary | ICD-10-CM | POA: Diagnosis not present

## 2015-11-07 DIAGNOSIS — G6289 Other specified polyneuropathies: Secondary | ICD-10-CM | POA: Diagnosis not present

## 2015-11-09 HISTORY — PX: SKIN CANCER EXCISION: SHX779

## 2015-11-23 DIAGNOSIS — C44319 Basal cell carcinoma of skin of other parts of face: Secondary | ICD-10-CM | POA: Diagnosis not present

## 2015-11-23 DIAGNOSIS — Z85828 Personal history of other malignant neoplasm of skin: Secondary | ICD-10-CM | POA: Diagnosis not present

## 2015-11-23 DIAGNOSIS — L814 Other melanin hyperpigmentation: Secondary | ICD-10-CM | POA: Diagnosis not present

## 2015-11-23 DIAGNOSIS — L578 Other skin changes due to chronic exposure to nonionizing radiation: Secondary | ICD-10-CM | POA: Diagnosis not present

## 2015-11-28 DIAGNOSIS — B0223 Postherpetic polyneuropathy: Secondary | ICD-10-CM | POA: Diagnosis not present

## 2015-11-28 DIAGNOSIS — G894 Chronic pain syndrome: Secondary | ICD-10-CM | POA: Diagnosis not present

## 2015-12-05 ENCOUNTER — Ambulatory Visit (INDEPENDENT_AMBULATORY_CARE_PROVIDER_SITE_OTHER): Payer: Medicare Other | Admitting: Family Medicine

## 2015-12-05 ENCOUNTER — Encounter: Payer: Self-pay | Admitting: Family Medicine

## 2015-12-05 DIAGNOSIS — R3912 Poor urinary stream: Secondary | ICD-10-CM | POA: Diagnosis not present

## 2015-12-05 DIAGNOSIS — I10 Essential (primary) hypertension: Secondary | ICD-10-CM

## 2015-12-05 DIAGNOSIS — E7800 Pure hypercholesterolemia, unspecified: Secondary | ICD-10-CM | POA: Diagnosis not present

## 2015-12-05 DIAGNOSIS — R319 Hematuria, unspecified: Secondary | ICD-10-CM | POA: Insufficient documentation

## 2015-12-05 DIAGNOSIS — R310 Gross hematuria: Secondary | ICD-10-CM

## 2015-12-05 DIAGNOSIS — N401 Enlarged prostate with lower urinary tract symptoms: Secondary | ICD-10-CM | POA: Diagnosis not present

## 2015-12-05 LAB — MICROSCOPIC EXAMINATION

## 2015-12-05 LAB — URINALYSIS, ROUTINE W REFLEX MICROSCOPIC
BILIRUBIN UA: NEGATIVE
GLUCOSE, UA: NEGATIVE
KETONES UA: NEGATIVE
NITRITE UA: NEGATIVE
PROTEIN UA: NEGATIVE
Specific Gravity, UA: 1.02 (ref 1.005–1.030)
UUROB: 0.2 mg/dL (ref 0.2–1.0)
pH, UA: 6.5 (ref 5.0–7.5)

## 2015-12-05 MED ORDER — ATORVASTATIN CALCIUM 40 MG PO TABS: 40.0000 mg | ORAL_TABLET | Freq: Every day | ORAL | 4 refills | Status: DC

## 2015-12-05 MED ORDER — BENAZEPRIL HCL 20 MG PO TABS: 20.0000 mg | ORAL_TABLET | Freq: Every day | ORAL | 4 refills | Status: DC

## 2015-12-05 NOTE — Assessment & Plan Note (Signed)
The current medical regimen is effective;  continue present plan and medications.  

## 2015-12-05 NOTE — Assessment & Plan Note (Signed)
Because of hematuria history of kidney stones and abdominal complaints will refer to urology to further evaluate

## 2015-12-05 NOTE — Progress Notes (Signed)
BP 110/63 (BP Location: Left Arm, Patient Position: Sitting, Cuff Size: Normal)   Pulse 69   Temp 97.9 F (36.6 C)   Ht 5' 6"  (1.676 m)   Wt 159 lb (72.1 kg)   BMI 25.66 kg/m    Subjective:    Patient ID: Timothy Knapp, male    DOB: 04/19/1940, 75 y.o.   MRN: [id number]  HPI: Timothy Knapp is a 75 y.o. male  Chief Complaint  Patient presents with  . Annual Exam   AWV metrics met  Patient for physical biggest issue of course is continued postherpetic neuralgia patient going through new pain clinic but no change in medicine or therapy is trying a new cream may hope for incremental change. Blood pressure cholesterol doing well no complaints from medications taken faithfully without issues or problems. Relevant past medical, surgical, family and social history reviewed and updated as indicated. Interim medical history since our last visit reviewed. Allergies and medications reviewed and updated.  Review of Systems  Constitutional: Negative.   HENT: Negative.   Eyes: Negative.   Respiratory: Negative.   Cardiovascular: Negative.   Gastrointestinal: Negative.   Endocrine: Negative.   Genitourinary: Negative.        Patient also with some left lower quadrant abdominal pain has had some mild constipation issues but also had some hematuria for 2 days and then resolution of left lower quadrant pain. Has not noticed passing of any stones hasn't had no skin changes. Also bowels and constipation is cleared up.  Musculoskeletal: Negative.   Skin: Negative.   Allergic/Immunologic: Negative.   Neurological: Negative.   Hematological: Negative.   Psychiatric/Behavioral: Negative.     Per HPI unless specifically indicated above     Objective:    BP 110/63 (BP Location: Left Arm, Patient Position: Sitting, Cuff Size: Normal)   Pulse 69   Temp 97.9 F (36.6 C)   Ht 5' 6"  (1.676 m)   Wt 159 lb (72.1 kg)   BMI 25.66 kg/m   Wt Readings from Last 3 Encounters:  12/05/15 159 lb  (72.1 kg)  09/19/15 155 lb (70.3 kg)  08/23/15 155 lb (70.3 kg)    Physical Exam  Constitutional: He is oriented to person, place, and time. He appears well-developed and well-nourished.  HENT:  Head: Normocephalic and atraumatic.  Right Ear: External ear normal.  Left Ear: External ear normal.  Eyes: Conjunctivae and EOM are normal. Pupils are equal, round, and reactive to light.  Neck: Normal range of motion. Neck supple.  Cardiovascular: Normal rate, regular rhythm, normal heart sounds and intact distal pulses.   Pulmonary/Chest: Effort normal and breath sounds normal.  Abdominal: Soft. Bowel sounds are normal. There is no splenomegaly or hepatomegaly.  Genitourinary: Rectum normal and penis normal.  Genitourinary Comments: Prostate enlarged  Musculoskeletal: Normal range of motion.  Neurological: He is alert and oriented to person, place, and time. He has normal reflexes.  Skin: No rash noted. No erythema.  Psychiatric: He has a normal mood and affect. His behavior is normal. Judgment and thought content normal.    Results for orders placed or performed in visit on 06/02/15  ToxASSURE Select 13 (MW), Urine  Result Value Ref Range   ToxAssure Select 13 FINAL       Assessment & Plan:   Problem List Items Addressed This Visit      Cardiovascular and Mediastinum   Hypertension    The current medical regimen is effective;  continue present plan and  medications.       Relevant Medications   benazepril (LOTENSIN) 20 MG tablet   atorvastatin (LIPITOR) 40 MG tablet   Other Relevant Orders   Comprehensive metabolic panel   CBC with Differential/Platelet   TSH     Genitourinary   Benign prostatic hyperplasia    The current medical regimen is effective;  continue present plan and medications.       Relevant Orders   PSA     Other   Hyperlipidemia    The current medical regimen is effective;  continue present plan and medications.       Relevant Medications    benazepril (LOTENSIN) 20 MG tablet   atorvastatin (LIPITOR) 40 MG tablet   Other Relevant Orders   Comprehensive metabolic panel   Lipid panel   TSH   Hematuria    Because of hematuria history of kidney stones and abdominal complaints will refer to urology to further evaluate      Relevant Orders   Ambulatory referral to Urology   Comprehensive metabolic panel   CBC with Differential/Platelet   Urinalysis, Routine w reflex microscopic (not at [facility])       Follow up plan: Return in about 6 months (around 06/03/2016) for BMP,  Lipids, ALT, AST.

## 2015-12-06 ENCOUNTER — Encounter: Payer: Self-pay | Admitting: Family Medicine

## 2015-12-06 ENCOUNTER — Telehealth: Payer: Self-pay | Admitting: Family Medicine

## 2015-12-06 DIAGNOSIS — R972 Elevated prostate specific antigen [PSA]: Secondary | ICD-10-CM

## 2015-12-06 LAB — COMPREHENSIVE METABOLIC PANEL
A/G RATIO: 1.8 (ref 1.2–2.2)
ALT: 37 IU/L (ref 0–44)
AST: 52 IU/L — ABNORMAL HIGH (ref 0–40)
Albumin: 4.1 g/dL (ref 3.5–4.8)
Alkaline Phosphatase: 64 IU/L (ref 39–117)
BUN/Creatinine Ratio: 24 (ref 10–24)
BUN: 20 mg/dL (ref 8–27)
Bilirubin Total: 0.5 mg/dL (ref 0.0–1.2)
CO2: 22 mmol/L (ref 18–29)
Calcium: 8.9 mg/dL (ref 8.6–10.2)
Chloride: 101 mmol/L (ref 96–106)
Creatinine, Ser: 0.84 mg/dL (ref 0.76–1.27)
GFR, EST AFRICAN AMERICAN: 98 mL/min/{1.73_m2} (ref >59)
GFR, EST NON AFRICAN AMERICAN: 85 mL/min/{1.73_m2} (ref >59)
Globulin, Total: 2.3 g/dL (ref 1.5–4.5)
Glucose: 114 mg/dL — ABNORMAL HIGH (ref 65–99)
Potassium: 4.6 mmol/L (ref 3.5–5.2)
Sodium: 139 mmol/L (ref 134–144)
TOTAL PROTEIN: 6.4 g/dL (ref 6.0–8.5)

## 2015-12-06 LAB — CBC WITH DIFFERENTIAL/PLATELET
BASOS ABS: 0 10*3/uL (ref 0.0–0.2)
Basos: 1 %
EOS (ABSOLUTE): 0.3 10*3/uL (ref 0.0–0.4)
Eos: 4 %
Hematocrit: 43.9 % (ref 37.5–51.0)
Hemoglobin: 14.5 g/dL (ref 12.6–17.7)
Immature Grans (Abs): 0 10*3/uL (ref 0.0–0.1)
Immature Granulocytes: 0 %
LYMPHS ABS: 1.6 10*3/uL (ref 0.7–3.1)
Lymphs: 20 %
MCH: 29.9 pg (ref 26.6–33.0)
MCHC: 33 g/dL (ref 31.5–35.7)
MCV: 91 fL (ref 79–97)
MONOS ABS: 0.5 10*3/uL (ref 0.1–0.9)
Monocytes: 6 %
Neutrophils Absolute: 5.2 10*3/uL (ref 1.4–7.0)
Neutrophils: 69 %
Platelets: 231 10*3/uL (ref 150–379)
RBC: 4.85 x10E6/uL (ref 4.14–5.80)
RDW: 13.9 % (ref 12.3–15.4)
WBC: 7.7 10*3/uL (ref 3.4–10.8)

## 2015-12-06 LAB — LIPID PANEL
CHOL/HDL RATIO: 3.5 ratio (ref 0.0–5.0)
Cholesterol, Total: 171 mg/dL (ref 100–199)
HDL: 49 mg/dL (ref >39)
LDL CALC: 102 mg/dL — AB (ref 0–99)
Triglycerides: 102 mg/dL (ref 0–149)
VLDL CHOLESTEROL CAL: 20 mg/dL (ref 5–40)

## 2015-12-06 LAB — TSH: TSH: 2.49 u[IU]/mL (ref 0.450–4.500)

## 2015-12-06 LAB — PSA: PROSTATE SPECIFIC AG, SERUM: 3.5 ng/mL (ref 0.0–4.0)

## 2015-12-06 NOTE — Telephone Encounter (Signed)
Phone call Discussed with patient elevating PSA will recheck PSA in about 6 months.

## 2015-12-07 ENCOUNTER — Ambulatory Visit: Payer: Medicare Other

## 2016-01-11 DIAGNOSIS — Z79899 Other long term (current) drug therapy: Secondary | ICD-10-CM | POA: Diagnosis not present

## 2016-01-11 DIAGNOSIS — G894 Chronic pain syndrome: Secondary | ICD-10-CM | POA: Diagnosis not present

## 2016-01-11 DIAGNOSIS — B0223 Postherpetic polyneuropathy: Secondary | ICD-10-CM | POA: Diagnosis not present

## 2016-01-11 DIAGNOSIS — Z5181 Encounter for therapeutic drug level monitoring: Secondary | ICD-10-CM | POA: Diagnosis not present

## 2016-02-29 ENCOUNTER — Telehealth: Payer: Self-pay | Admitting: Family Medicine

## 2016-02-29 DIAGNOSIS — B0223 Postherpetic polyneuropathy: Secondary | ICD-10-CM | POA: Diagnosis not present

## 2016-02-29 DIAGNOSIS — G894 Chronic pain syndrome: Secondary | ICD-10-CM | POA: Diagnosis not present

## 2016-02-29 DIAGNOSIS — B0229 Other postherpetic nervous system involvement: Secondary | ICD-10-CM

## 2016-02-29 DIAGNOSIS — M5134 Other intervertebral disc degeneration, thoracic region: Secondary | ICD-10-CM

## 2016-02-29 NOTE — Telephone Encounter (Signed)
Pt called and stated that he is currently going to [facility] but he would like to change and start going Dr Dossie Arbour.

## 2016-02-29 NOTE — Telephone Encounter (Signed)
Please see message about referral.

## 2016-03-01 NOTE — Telephone Encounter (Signed)
MAC to sign referral form, then will fax to [employer].

## 2016-03-07 DIAGNOSIS — M25561 Pain in right knee: Secondary | ICD-10-CM | POA: Diagnosis not present

## 2016-03-07 DIAGNOSIS — M170 Bilateral primary osteoarthritis of knee: Secondary | ICD-10-CM | POA: Diagnosis not present

## 2016-03-07 DIAGNOSIS — M25562 Pain in left knee: Secondary | ICD-10-CM | POA: Diagnosis not present

## 2016-03-12 NOTE — Addendum Note (Signed)
Addended by: Sandria Manly on: 03/12/2016 01:42 PM   Modules accepted: Orders

## 2016-03-12 NOTE — Telephone Encounter (Signed)
Still something happening with [facility] with [employer] with Dr. Dossie Arbour.  Patient still hasn't heard back based on referral.   Wants to see Dr. Andree Elk at [facility]. Referral entered.

## 2016-03-14 DIAGNOSIS — M25561 Pain in right knee: Secondary | ICD-10-CM | POA: Diagnosis not present

## 2016-03-14 DIAGNOSIS — R262 Difficulty in walking, not elsewhere classified: Secondary | ICD-10-CM | POA: Diagnosis not present

## 2016-03-14 DIAGNOSIS — M170 Bilateral primary osteoarthritis of knee: Secondary | ICD-10-CM | POA: Diagnosis not present

## 2016-03-14 DIAGNOSIS — M25562 Pain in left knee: Secondary | ICD-10-CM | POA: Diagnosis not present

## 2016-03-20 ENCOUNTER — Telehealth: Payer: Self-pay | Admitting: Family Medicine

## 2016-03-20 NOTE — Telephone Encounter (Signed)
Patient called regarding a referral to Dr Milinda Pointer.   [phone]

## 2016-03-21 NOTE — Telephone Encounter (Signed)
Pt calling about pain referral.

## 2016-03-21 NOTE — Telephone Encounter (Signed)
Left message at [facility] for someone to return my call regarding patient's referral.

## 2016-03-22 DIAGNOSIS — M1711 Unilateral primary osteoarthritis, right knee: Secondary | ICD-10-CM | POA: Diagnosis not present

## 2016-03-22 DIAGNOSIS — M25561 Pain in right knee: Secondary | ICD-10-CM | POA: Diagnosis not present

## 2016-03-22 NOTE — Telephone Encounter (Signed)
Patient called back. Explained to patient that the referral was sent over to [facility]. I called yesterday for someone to return my call and haven't received a phone call back.  Patient stated that he went to [facility]. Dr. Consuela Mimes is there full time. Dr. Andree Elk is there part time. However, [employer] told me last week Dr. Consuela Mimes was now in [place].   I explained to patient I will call [facility]. He could call and ask for an appointment, the referral is in the WQ. To call me if he had any further questions to call me back.  Patient understood.

## 2016-03-27 DIAGNOSIS — M1712 Unilateral primary osteoarthritis, left knee: Secondary | ICD-10-CM | POA: Diagnosis not present

## 2016-03-27 DIAGNOSIS — M25562 Pain in left knee: Secondary | ICD-10-CM | POA: Diagnosis not present

## 2016-04-03 DIAGNOSIS — M25561 Pain in right knee: Secondary | ICD-10-CM | POA: Diagnosis not present

## 2016-04-03 DIAGNOSIS — M1711 Unilateral primary osteoarthritis, right knee: Secondary | ICD-10-CM | POA: Diagnosis not present

## 2016-04-09 DIAGNOSIS — M1712 Unilateral primary osteoarthritis, left knee: Secondary | ICD-10-CM | POA: Diagnosis not present

## 2016-04-09 DIAGNOSIS — M25562 Pain in left knee: Secondary | ICD-10-CM | POA: Diagnosis not present

## 2016-04-16 DIAGNOSIS — M25561 Pain in right knee: Secondary | ICD-10-CM | POA: Diagnosis not present

## 2016-04-16 DIAGNOSIS — M1711 Unilateral primary osteoarthritis, right knee: Secondary | ICD-10-CM | POA: Diagnosis not present

## 2016-04-23 DIAGNOSIS — M1712 Unilateral primary osteoarthritis, left knee: Secondary | ICD-10-CM | POA: Diagnosis not present

## 2016-04-23 DIAGNOSIS — M25562 Pain in left knee: Secondary | ICD-10-CM | POA: Diagnosis not present

## 2016-04-25 DIAGNOSIS — G894 Chronic pain syndrome: Secondary | ICD-10-CM | POA: Diagnosis not present

## 2016-04-25 DIAGNOSIS — B0223 Postherpetic polyneuropathy: Secondary | ICD-10-CM | POA: Diagnosis not present

## 2016-04-26 ENCOUNTER — Telehealth: Payer: Self-pay | Admitting: *Deleted

## 2016-04-26 NOTE — Telephone Encounter (Addendum)
Pt states he has dropped something heavy on his foot and his wife would like him to make an appt.05/01/2016-Pt called for x-ray results from [facility]. I informed pt of the results of the 04/28/2016 x-ray.

## 2016-04-27 ENCOUNTER — Encounter: Payer: Self-pay | Admitting: Podiatry

## 2016-04-27 ENCOUNTER — Ambulatory Visit (INDEPENDENT_AMBULATORY_CARE_PROVIDER_SITE_OTHER): Payer: PPO | Admitting: Podiatry

## 2016-04-27 DIAGNOSIS — S99921A Unspecified injury of right foot, initial encounter: Secondary | ICD-10-CM | POA: Diagnosis not present

## 2016-04-28 ENCOUNTER — Ambulatory Visit
Admission: RE | Admit: 2016-04-28 | Discharge: 2016-04-28 | Disposition: A | Discharge status: Home / Self Care | Payer: PPO | Source: Ambulatory Visit | Attending: Podiatry | Admitting: Podiatry

## 2016-04-28 DIAGNOSIS — M19071 Primary osteoarthritis, right ankle and foot: Secondary | ICD-10-CM | POA: Diagnosis not present

## 2016-04-28 DIAGNOSIS — S99921A Unspecified injury of right foot, initial encounter: Secondary | ICD-10-CM | POA: Diagnosis not present

## 2016-04-28 DIAGNOSIS — S9031XA Contusion of right foot, initial encounter: Secondary | ICD-10-CM | POA: Diagnosis not present

## 2016-04-29 NOTE — Progress Notes (Signed)
   Subjective: Patient is a 76 year old male presenting today with a new complaint of right foot pain that began about 4 days ago. He reports associated bruising and pain in the toes of the right foot as well as on the medial aspect of the right ankle. Pt reports dropping a deck on top of the right foot. Moving his toes increases his pain. He denies alleviating factors.    Objective/Physical Exam General: The patient is alert and oriented x3 in no acute distress.  Dermatology: Skin is warm, dry and supple bilateral lower extremities. Negative for open lesions or macerations.  Vascular: Palpable pedal pulses bilaterally. No edema or erythema noted. Capillary refill within normal limits.  Neurological: Epicritic and protective threshold grossly intact bilaterally.   Musculoskeletal Exam: Ecchymosis with edema diffusely throught the right foot. Negative for pain with palpation. Range of motion within normal limits to all pedal and ankle joints bilateral. Muscle strength 5/5 in all groups bilateral.    Assessment: #1 Trauma of the right foot. Suspect foot fracture. #2 Edema with ecchymosis of right foot.   Plan of Care:  #1 Patient was evaluated. #2 Orders for X-Ray of the right foot at [facility]  #3 Compression anklet dispensed. #4 Return to clinic in 1 week to review X-Rays.   Edrick Kins, DPM [facility]  Dr. Edrick Kins, [place]                                        [place], [place] [address]                Office [phone]  Fax [phone]

## 2016-04-30 DIAGNOSIS — M25562 Pain in left knee: Secondary | ICD-10-CM | POA: Diagnosis not present

## 2016-04-30 DIAGNOSIS — M25561 Pain in right knee: Secondary | ICD-10-CM | POA: Diagnosis not present

## 2016-04-30 DIAGNOSIS — M170 Bilateral primary osteoarthritis of knee: Secondary | ICD-10-CM | POA: Diagnosis not present

## 2016-05-01 NOTE — Telephone Encounter (Signed)
Sure

## 2016-05-10 ENCOUNTER — Telehealth: Payer: Self-pay | Admitting: Family Medicine

## 2016-05-10 DIAGNOSIS — I10 Essential (primary) hypertension: Secondary | ICD-10-CM

## 2016-05-10 NOTE — Telephone Encounter (Signed)
Pt has refills until October. Called pt. He was mistaken, he does have another bottle already from mail order.

## 2016-05-10 NOTE — Telephone Encounter (Signed)
Patient called in regards to filling a prescription (Benazepril 20 MG). Patient states he has only 5 pills left. Patient asked if he can pick up the prescription instead of getting it sent to the mail order pharmacy. Please Advise.   Patient contact: [phone]  Thank you

## 2016-05-24 ENCOUNTER — Ambulatory Visit: Payer: PPO | Attending: Anesthesiology | Admitting: Anesthesiology

## 2016-05-24 ENCOUNTER — Telehealth: Payer: Self-pay | Admitting: *Deleted

## 2016-05-24 ENCOUNTER — Encounter: Payer: Self-pay | Admitting: Anesthesiology

## 2016-05-24 VITALS — BP 123/64 | HR 59 | Temp 97.9°F | Resp 16 | Ht 67.0 in | Wt 155.0 lb

## 2016-05-24 DIAGNOSIS — Z8249 Family history of ischemic heart disease and other diseases of the circulatory system: Secondary | ICD-10-CM | POA: Insufficient documentation

## 2016-05-24 DIAGNOSIS — N400 Enlarged prostate without lower urinary tract symptoms: Secondary | ICD-10-CM | POA: Insufficient documentation

## 2016-05-24 DIAGNOSIS — M545 Low back pain: Secondary | ICD-10-CM | POA: Diagnosis not present

## 2016-05-24 DIAGNOSIS — I10 Essential (primary) hypertension: Secondary | ICD-10-CM | POA: Insufficient documentation

## 2016-05-24 DIAGNOSIS — Z7982 Long term (current) use of aspirin: Secondary | ICD-10-CM | POA: Diagnosis not present

## 2016-05-24 DIAGNOSIS — G8929 Other chronic pain: Secondary | ICD-10-CM

## 2016-05-24 DIAGNOSIS — M5134 Other intervertebral disc degeneration, thoracic region: Secondary | ICD-10-CM

## 2016-05-24 DIAGNOSIS — E785 Hyperlipidemia, unspecified: Secondary | ICD-10-CM | POA: Diagnosis not present

## 2016-05-24 DIAGNOSIS — B0229 Other postherpetic nervous system involvement: Secondary | ICD-10-CM | POA: Diagnosis not present

## 2016-05-24 DIAGNOSIS — M546 Pain in thoracic spine: Secondary | ICD-10-CM

## 2016-05-24 NOTE — Progress Notes (Signed)
Subjective:  Patient ID: Timothy Knapp, male    DOB: 1940-10-15  Age: 76 y.o. MRN: [id number]  CC: Back Pain (lower right )     PROCEDURE:None  HPI Timothy Knapp presents for a new patient evaluation. He is referred by Timothy Knapp for evaluation secondary to right side chronic thoracic postherpetic neuralgia. He describes a chronic aching burning pain that arises in the posterior back and radiates under the rib margin anterior in the abdominal wall to about the umbilicus region. He denies any active blisters and has been diagnosed with postherpetic neuralgia following an initial zoster virus breakout several years ago. He has had an extensive evaluation and workup and has had injection therapy including intercostal nerve blocks and thoracic epidurals but despite this and other modalities of conservative therapy he continues to have pain. He's been on multiple medications that have been reviewed today and is only give him partial relief. Consequently, he has been placed on chronic opioid therapy and he presents today for evaluation and assistance with opioid management from Timothy Knapp. The quality characteristic addition patient of his right side thoracic pain a been stable over the last many months and generally he gets significant relief with oxycodone at the 10 mg strength taking this approximately 3 times a day. This regimen seems to work well for him and when he is not taking the medications he is in severe pain and has had few side effects with the medication. We have reviewed his [facility] and it is appropriate. In the past he's been seen here in the pain clinic by Timothy Knapp. He denies upper extremity weakness.  History [name] has a past medical history of BPH (benign prostatic hypertrophy); Hyperlipidemia; and Hypertension.   He has a past surgical history that includes Prostate surgery (N/A) and Skin cancer excision (11/09/2015).   His family history  includes Heart disease in his mother.He reports that he has never smoked. He has never used smokeless tobacco. He reports that he does not drink alcohol or use drugs.    ToxAssure Select 13  Date Value Ref Range Status  06/02/2015 FINAL  Final    Comment:    ==================================================================== TOXASSURE SELECT 13 (MW) ==================================================================== Test                             Result       Flag       Units Drug Present and Declared for Prescription Verification   Oxycodone                      833          EXPECTED   ng/mg creat   Oxymorphone                    2581         EXPECTED   ng/mg creat   Noroxycodone                   3605         EXPECTED   ng/mg creat   Noroxymorphone                 669          EXPECTED   ng/mg creat    Sources of oxycodone are scheduled prescription medications.    Oxymorphone, noroxycodone, and noroxymorphone are expected    metabolites  of oxycodone. Oxymorphone is also available as a    scheduled prescription medication. ==================================================================== Test                      Result    Flag   Units      Ref Range   Creatinine              149              mg/dL      >=20 ==================================================================== Declared Medications:  The flagging and interpretation on this report are based on the  following declared medications.  Unexpected results may arise from  inaccuracies in the declared medications.  **Note: The testing scope of this panel includes these medications:  Oxycodone (Oxy-IR)  **Note: The testing scope of this panel does not include following  reported medications:  Amitriptyline (Elavil)  Aspirin (Aspirin 81)  Atorvastatin (Lipitor)  Benazepril (Lotensin)  Capsaicin  Duloxetine (Cymbalta)  Gabapentin (Neurontin)  Herbal Product  Lidocaine (Lidoderm)  Magnesium (Mag)  Multivitamin  (Metanx)  Prasterone  Supplement  Supplement (Resveratrol)  Turmeric  Ubiquinone (Coenzyme Q 10)  Vitamin B  Vitamin D3  Zinc ==================================================================== For clinical consultation, please call [phone]. ====================================================================     Outpatient Medications Prior to Visit  Medication Sig Dispense Refill  . amitriptyline (ELAVIL) 10 MG tablet Limit 1 - 3 tablets by mouth at bedtime if tolerated 90 tablet 0  . Apple Cider Vinegar 500 MG TABS Take 450 mg by mouth.    [name] aspirin 81 MG tablet Take 81 mg by mouth daily. Reported on 06/13/2015    . atorvastatin (LIPITOR) 40 MG tablet Take 1 tablet (40 mg total) by mouth at bedtime. 90 tablet 4  . b complex vitamins tablet Take 1 tablet by mouth daily.     . benazepril (LOTENSIN) 20 MG tablet Take 1 tablet (20 mg total) by mouth daily. 90 tablet 4  . Capsaicin (CAPZASIN EX) Apply 0.025 % topically 2 (two) times daily.    . DULoxetine (CYMBALTA) 30 MG capsule Limit 1 tablet by mouth per day if tolerated 30 capsule 0  . gabapentin (NEURONTIN) 800 MG tablet Limited 1-2 tabs by mouth per day if tolerated 60 tablet 2  . lidocaine (LIDODERM) 5 % Apply patch to painful area of skin for 12 hours then remove for 12 hours and repeat process if tolerated 30 patch 0  . Turmeric 500 MG CAPS Take 500 mg by mouth 2 (two) times daily. Reported on 05/30/2015    . Cholecalciferol (VITAMIN D-3) 1000 units CAPS Take 1,000 Units by mouth. Reported on 06/30/2015    . Coconut Oil (EQL COCONUT OIL) 1000 MG CAPS Take 1,000 mg by mouth 2 (two) times daily. Reported on 06/30/2015    . Coenzyme Q10 (CO Q-10 PO) Take 1 tablet by mouth daily. Reported on 06/13/2015    . DHEA 50 MG TABS Take 50 mg by mouth. Reported on 06/13/2015    . Ginger, Zingiber officinalis, (GINGER ROOT) 550 MG CAPS Take 1 capsule by mouth. Reported on 05/30/2015    . l-methylfolate-B6-B12 (METANX) 3-35-2 MG TABS tablet  Take 1 tablet by mouth 2 (two) times daily.    [name] MAGNESIUM CITRATE PO Take 160 mg by mouth daily. Reported on 06/30/2015    . methylPREDNISolone (MEDROL DOSEPAK) 4 MG TBPK tablet 6 day dose pack - take as directed (Patient not taking: Reported on 12/05/2015) 21 tablet 0  . multivitamin (METANX) 3-35-2  MG TABS tablet Limit 1 capsule by mouth per day or twice per day if tolerated (Patient not taking: Reported on 12/05/2015) 60 tablet 2  . oxyCODONE (OXY IR/ROXICODONE) 5 MG immediate release tablet Limit 3-6  tabs by mouth per day if tolerated   Dispense 24 tabs for a 4 day supply please (Patient not taking: Reported on 05/24/2016) 24 tablet 0  . Resveratrol 250 MG CAPS Take by mouth daily.    [name] zinc gluconate 50 MG tablet Take 50 mg by mouth daily. Reported on 06/13/2015     Facility-Administered Medications Prior to Visit  Medication Dose Route Frequency Provider Last Rate Last Dose  . lactated ringers infusion 1,000 mL  1,000 mL Intravenous Continuous Mohammed Kindle, MD      . lactated ringers infusion 1,000 mL  1,000 mL Intravenous Continuous Mohammed Kindle, MD      . lactated ringers infusion 1,000 mL  1,000 mL Intravenous Continuous Mohammed Kindle, MD      . orphenadrine (NORFLEX) injection 60 mg  60 mg Intramuscular Once Mohammed Kindle, MD      . orphenadrine (NORFLEX) injection 60 mg  60 mg Intramuscular Once Mohammed Kindle, MD       Lab Results  Component Value Date   WBC 7.7 12/05/2015   HGB 14.5 08/24/2013   HCT 43.9 12/05/2015   PLT 231 12/05/2015   GLUCOSE 114 (H) 12/05/2015   CHOL 171 12/05/2015   TRIG 102 12/05/2015   HDL 49 12/05/2015   LDLCALC 102 (H) 12/05/2015   ALT 37 12/05/2015   AST 52 (H) 12/05/2015   NA 139 12/05/2015   K 4.6 12/05/2015   CL 101 12/05/2015   CREATININE 0.84 12/05/2015   BUN 20 12/05/2015   CO2 22 12/05/2015   TSH 2.490 12/05/2015     --------------------------------------------------------------------------------------------------------------------- Dg Foot Complete Right  Result Date: 04/28/2016 CLINICAL DATA:  Patient dropped heavy object onto foot 5 days prior. Extensive bruising. Pain in the lateral foot. Initial encounter. EXAM: RIGHT FOOT COMPLETE - 3+ VIEW COMPARISON:  None. FINDINGS: Hallux valgus deformity. First MTP joint degenerative changes. Midfoot degenerative changes. No evidence for acute fracture or dislocation. IMPRESSION: No acute osseous abnormality.  First MTP joint degenerative changes. Electronically Signed   By: Lovey Newcomer M.D.   On: 04/28/2016 12:21       ---------------------------------------------------------------------------------------------------------------------- Past Medical History:  Diagnosis Date  . BPH (benign prostatic hypertrophy)   . Hyperlipidemia   . Hypertension     Past Surgical History:  Procedure Laterality Date  . PROSTATE SURGERY N/A   . SKIN CANCER EXCISION  11/09/2015    Family History  Problem Relation Age of Onset  . Heart disease Mother     Social History  Substance Use Topics  . Smoking status: Never Smoker  . Smokeless tobacco: Never Used  . Alcohol use No    ---------------------------------------------------------------------------------------------------------------------  Scheduled Meds: Continuous Infusions: PRN Meds:.   BP 123/64 (BP Location: Right Arm, Patient Position: Sitting, Cuff Size: Normal)   Pulse (!) 59   Temp 97.9 F (36.6 C) (Oral)   Resp 16   Ht 5\' 7"  (1.702 m)   Wt 155 lb (70.3 kg)   SpO2 94%   BMI 24.28 kg/m    BP Readings from Last 3 Encounters:  05/24/16 123/64  12/05/15 110/63  09/28/15 (!) 154/84     Wt Readings from Last 3 Encounters:  05/24/16 155 lb (70.3 kg)  12/05/15 159 lb (72.1 kg)  09/19/15 155 lb (  70.3 kg)      ----------------------------------------------------------------------------------------------------------------------  ROS Review of Systems  Cardiac: No angina or palpitations Pulmonary: No shortness of breath GI: No constipation GU: No hematuria or hesitancy  Objective:  BP 123/64 (BP Location: Right Arm, Patient Position: Sitting, Cuff Size: Normal)   Pulse (!) 59   Temp 97.9 F (36.6 C) (Oral)   Resp 16   Ht 5\' 7"  (1.702 m)   Wt 155 lb (70.3 kg)   SpO2 94%   BMI 24.28 kg/m   Physical Exam Patient is alert oriented cooperative compliant Pupils equally round reactive to light extraocular muscles are intact Heart is regular rate and rhythm Inspection the thoracic back reveals no evidence of any cutaneous erythema or edema scarring or blistering. He does have hyperalgesia and some allodynia in a T8-T10 dermatomal region radiating from posterior to anterior abdominal wall. This is very tender to touch     Assessment & Plan:   Timothy Knapp was seen today for back pain.  Diagnoses and all orders for this visit:  Postherpetic neuralgia -     ToxASSURE Select 13 (MW), Urine  DDD (degenerative disc disease), thoracic  Chronic right-sided thoracic back pain     ----------------------------------------------------------------------------------------------------------------------  Problem List Items Addressed This Visit      Nervous and Auditory   Postherpetic neuralgia - Primary   Relevant Orders   ToxASSURE Select 13 (MW), Urine     Musculoskeletal and Integument   DDD (degenerative disc disease), thoracic    Other Visit Diagnoses    Chronic right-sided thoracic back pain          ----------------------------------------------------------------------------------------------------------------------  1. Postherpetic neuralgia Based on the current findings of think his medication regimen is reasonable. I have offered him interventional therapy but he states  that the previous nerve blocks and epidurals were of questionable benefit. Perhaps these can be reserved for an acute exacerbation. I do think it's reasonable based on his current symptoms and his previous response to opioids to continue this. A urine drug screen will be ordered and we will have him return to clinic in a few weeks and we'll be happy to assist Timothy Knapp with maintenance of therapy. - ToxASSURE Select 13 (MW), Urine  2. DDD (degenerative disc disease), thoracic As above  3. Chronic right-sided thoracic back pain We did talk about some options for interventional therapy with a dorsal column stimulator and he will consider this but at present is uninterested.     ----------------------------------------------------------------------------------------------------------------------  I have discontinued Timothy Knapp MAGNESIUM CITRATE PO, multivitamin, zinc gluconate, Ginger Root, Vitamin D-3, DHEA, Coconut Oil, l-methylfolate-B6-B12, Resveratrol, Coenzyme Q10 (CO Q-10 PO), oxyCODONE, methylPREDNISolone, and oxycodone. I am also having him maintain his aspirin, b complex vitamins, Turmeric, Apple Cider Vinegar, Capsaicin (CAPZASIN EX), gabapentin, DULoxetine, amitriptyline, lidocaine, benazepril, atorvastatin, diclofenac sodium, and glucosamine-chondroitin. We will continue to administer lactated ringers, lactated ringers, orphenadrine, lactated ringers, and orphenadrine.   Meds ordered this encounter  Medications  . DISCONTD: oxycodone (OXY-IR) 5 MG capsule    Sig: Take 10 mg by mouth every 8 (eight) hours as needed.  . diclofenac sodium (VOLTAREN) 1 % GEL    Sig: Apply 1 g topically every 6 (six) hours as needed.  [name] glucosamine-chondroitin 500-400 MG tablet    Sig: Take 1 tablet by mouth daily.       Follow-up: Return in about 1 month (around 06/24/2016) for evaluation, med refill.    [name], MD 6:05 PM  The [place] practitioner database for opioid  medications on this  patient has been reviewed by me and my staff   Greater than 50% of the total encounter time was spent in counseling and / or coordination of care.     This dictation was performed utilizing Systems analyst.  Please excuse any unintentional or mistaken typographical errors as a result.

## 2016-05-24 NOTE — Progress Notes (Signed)
Safety precautions to be maintained throughout the outpatient stay will include: orient to surroundings, keep bed in low position, maintain call bell within reach at all times, provide assistance with transfer out of bed and ambulation.  

## 2016-05-28 ENCOUNTER — Ambulatory Visit (INDEPENDENT_AMBULATORY_CARE_PROVIDER_SITE_OTHER): Payer: Self-pay | Admitting: Family Medicine

## 2016-05-28 ENCOUNTER — Encounter: Payer: Self-pay | Admitting: Family Medicine

## 2016-05-28 VITALS — BP 125/71 | HR 66 | Ht 68.0 in | Wt 165.0 lb

## 2016-05-28 DIAGNOSIS — I10 Essential (primary) hypertension: Secondary | ICD-10-CM | POA: Diagnosis not present

## 2016-05-28 DIAGNOSIS — E7800 Pure hypercholesterolemia, unspecified: Secondary | ICD-10-CM

## 2016-05-28 LAB — LP+ALT+AST PICCOLO, WAIVED
ALT (SGPT) Piccolo, Waived: 31 U/L (ref 10–47)
AST (SGOT) Piccolo, Waived: 50 U/L — ABNORMAL HIGH (ref 11–38)
CHOL/HDL RATIO PICCOLO,WAIVE: 3.4 mg/dL
Cholesterol Piccolo, Waived: 161 mg/dL (ref <200)
HDL Chol Piccolo, Waived: 48 mg/dL — ABNORMAL LOW (ref >59)
LDL CHOL CALC PICCOLO WAIVED: 89 mg/dL (ref <100)
Triglycerides Piccolo,Waived: 125 mg/dL (ref <150)
VLDL Chol Calc Piccolo,Waive: 25 mg/dL (ref <30)

## 2016-05-28 NOTE — Assessment & Plan Note (Signed)
Cholesterol good control will reduce to 20 mg we will leave that 40 mg pill taken half a day.

## 2016-05-28 NOTE — Assessment & Plan Note (Signed)
The current medical regimen is effective;  continue present plan and medications.  

## 2016-05-28 NOTE — Progress Notes (Signed)
BP 125/71   Pulse 66   Ht 5\' 8"  (1.727 m)   Wt 165 lb (74.8 kg)   SpO2 98%   BMI 25.09 kg/m    Subjective:    Patient ID: Timothy Knapp, male    DOB: 02-08-1940, 76 y.o.   MRN: [id number]  HPI: Timothy Knapp is a 76 y.o. male  Follow-up hypercholesterol and blood pressure doing well with medications no complaints. Working with pain clinic for postherpetic neuralgia and doing adequate is looking to make a change in using cannabis.  Patient also interested in cutting back on cholesterol medication but not having any side effects or issues  Relevant past medical, surgical, family and social history reviewed and updated as indicated. Interim medical history since our last visit reviewed. Allergies and medications reviewed and updated.  Review of Systems  Constitutional: Negative.   Respiratory: Negative.   Cardiovascular: Negative.     Per HPI unless specifically indicated above     Objective:    BP 125/71   Pulse 66   Ht 5\' 8"  (1.727 m)   Wt 165 lb (74.8 kg)   SpO2 98%   BMI 25.09 kg/m   Wt Readings from Last 3 Encounters:  05/28/16 165 lb (74.8 kg)  05/24/16 155 lb (70.3 kg)  12/05/15 159 lb (72.1 kg)    Physical Exam  Constitutional: He is oriented to person, place, and time. He appears well-developed and well-nourished.  HENT:  Head: Normocephalic and atraumatic.  Eyes: Conjunctivae and EOM are normal.  Neck: Normal range of motion.  Cardiovascular: Normal rate, regular rhythm and normal heart sounds.   Pulmonary/Chest: Effort normal and breath sounds normal.  Musculoskeletal: Normal range of motion.  Neurological: He is alert and oriented to person, place, and time.  Skin: No erythema.  Psychiatric: He has a normal mood and affect. His behavior is normal. Judgment and thought content normal.    Results for orders placed or performed in visit on 12/05/15  Microscopic Examination  Result Value Ref Range   WBC, UA 0-5 0 - 5 /hpf   RBC, UA 3-10 (A) 0 -  2 /hpf   Epithelial Cells (non renal) 0-10 0 - 10 /hpf   Bacteria, UA Few (A) None seen/Few  Comprehensive metabolic panel  Result Value Ref Range   Glucose 114 (H) 65 - 99 mg/dL   BUN 20 8 - 27 mg/dL   Creatinine, Ser 0.84 0.76 - 1.27 mg/dL   GFR calc non Af Amer 85 >59 mL/min/1.73   GFR calc Af Amer 98 >59 mL/min/1.73   BUN/Creatinine Ratio 24 10 - 24   Sodium 139 134 - 144 mmol/L   Potassium 4.6 3.5 - 5.2 mmol/L   Chloride 101 96 - 106 mmol/L   CO2 22 18 - 29 mmol/L   Calcium 8.9 8.6 - 10.2 mg/dL   Total Protein 6.4 6.0 - 8.5 g/dL   Albumin 4.1 3.5 - 4.8 g/dL   Globulin, Total 2.3 1.5 - 4.5 g/dL   Albumin/Globulin Ratio 1.8 1.2 - 2.2   Bilirubin Total 0.5 0.0 - 1.2 mg/dL   Alkaline Phosphatase 64 39 - 117 IU/L   AST 52 (H) 0 - 40 IU/L   ALT 37 0 - 44 IU/L  Lipid panel  Result Value Ref Range   Cholesterol, Total 171 100 - 199 mg/dL   Triglycerides 102 0 - 149 mg/dL   HDL 49 >39 mg/dL   VLDL Cholesterol Cal 20 5 - 40 mg/dL  LDL Calculated 102 (H) 0 - 99 mg/dL   Chol/HDL Ratio 3.5 0.0 - 5.0 ratio units  CBC with Differential/Platelet  Result Value Ref Range   WBC 7.7 3.4 - 10.8 x10E3/uL   RBC 4.85 4.14 - 5.80 x10E6/uL   Hemoglobin 14.5 12.6 - 17.7 g/dL   Hematocrit 43.9 37.5 - 51.0 %   MCV 91 79 - 97 fL   MCH 29.9 26.6 - 33.0 pg   MCHC 33.0 31.5 - 35.7 g/dL   RDW 13.9 12.3 - 15.4 %   Platelets 231 150 - 379 x10E3/uL   Neutrophils 69 Not Estab. %   Lymphs 20 Not Estab. %   Monocytes 6 Not Estab. %   Eos 4 Not Estab. %   Basos 1 Not Estab. %   Neutrophils Absolute 5.2 1.4 - 7.0 x10E3/uL   Lymphocytes Absolute 1.6 0.7 - 3.1 x10E3/uL   Monocytes Absolute 0.5 0.1 - 0.9 x10E3/uL   EOS (ABSOLUTE) 0.3 0.0 - 0.4 x10E3/uL   Basophils Absolute 0.0 0.0 - 0.2 x10E3/uL   Immature Granulocytes 0 Not Estab. %   Immature Grans (Abs) 0.0 0.0 - 0.1 x10E3/uL  TSH  Result Value Ref Range   TSH 2.490 0.450 - 4.500 uIU/mL  Urinalysis, Routine w reflex microscopic (not at [facility])    Result Value Ref Range   Specific Gravity, UA 1.020 1.005 - 1.030   pH, UA 6.5 5.0 - 7.5   Color, UA Yellow Yellow   Appearance Ur Clear Clear   Leukocytes, UA 1+ (A) Negative   Protein, UA Negative Negative/Trace   Glucose, UA Negative Negative   Ketones, UA Negative Negative   RBC, UA Trace (A) Negative   Bilirubin, UA Negative Negative   Urobilinogen, Ur 0.2 0.2 - 1.0 mg/dL   Nitrite, UA Negative Negative   Microscopic Examination See below:   PSA  Result Value Ref Range   Prostate Specific Ag, Serum 3.5 0.0 - 4.0 ng/mL      Assessment & Plan:   Problem List Items Addressed This Visit      Cardiovascular and Mediastinum   Hypertension - Primary    The current medical regimen is effective;  continue present plan and medications.       Relevant Orders   Basic metabolic panel   LP+ALT+AST Piccolo, Waived     Other   Hyperlipidemia    Cholesterol good control will reduce to 20 mg we will leave that 40 mg pill taken half a day.      Relevant Orders   Basic metabolic panel   LP+ALT+AST Piccolo, Waived       Follow up plan: Return in about 6 months (around 11/28/2016) for Physical Exam.

## 2016-05-29 ENCOUNTER — Encounter: Payer: Self-pay | Admitting: Family Medicine

## 2016-05-29 LAB — BASIC METABOLIC PANEL
BUN/Creatinine Ratio: 22 (ref 10–24)
BUN: 18 mg/dL (ref 8–27)
CALCIUM: 8.8 mg/dL (ref 8.6–10.2)
CHLORIDE: 101 mmol/L (ref 96–106)
CO2: 20 mmol/L (ref 18–29)
Creatinine, Ser: 0.83 mg/dL (ref 0.76–1.27)
GFR calc Af Amer: 99 mL/min/{1.73_m2} (ref >59)
GFR calc non Af Amer: 85 mL/min/{1.73_m2} (ref >59)
Glucose: 124 mg/dL — ABNORMAL HIGH (ref 65–99)
POTASSIUM: 3.9 mmol/L (ref 3.5–5.2)
SODIUM: 136 mmol/L (ref 134–144)

## 2016-05-29 LAB — TOXASSURE SELECT 13 (MW), URINE

## 2016-07-06 ENCOUNTER — Ambulatory Visit: Payer: PPO | Attending: Anesthesiology | Admitting: Anesthesiology

## 2016-07-06 ENCOUNTER — Encounter: Payer: Self-pay | Admitting: Anesthesiology

## 2016-07-06 VITALS — BP 125/72 | HR 55 | Temp 97.8°F | Resp 16 | Ht 68.0 in | Wt 155.0 lb

## 2016-07-06 DIAGNOSIS — Z7982 Long term (current) use of aspirin: Secondary | ICD-10-CM | POA: Diagnosis not present

## 2016-07-06 DIAGNOSIS — Z85828 Personal history of other malignant neoplasm of skin: Secondary | ICD-10-CM | POA: Insufficient documentation

## 2016-07-06 DIAGNOSIS — M545 Low back pain: Secondary | ICD-10-CM | POA: Diagnosis not present

## 2016-07-06 DIAGNOSIS — Z79899 Other long term (current) drug therapy: Secondary | ICD-10-CM | POA: Diagnosis not present

## 2016-07-06 DIAGNOSIS — I10 Essential (primary) hypertension: Secondary | ICD-10-CM | POA: Diagnosis not present

## 2016-07-06 DIAGNOSIS — G8929 Other chronic pain: Secondary | ICD-10-CM | POA: Diagnosis not present

## 2016-07-06 DIAGNOSIS — B0229 Other postherpetic nervous system involvement: Secondary | ICD-10-CM | POA: Diagnosis not present

## 2016-07-06 DIAGNOSIS — E785 Hyperlipidemia, unspecified: Secondary | ICD-10-CM | POA: Diagnosis not present

## 2016-07-06 DIAGNOSIS — Z79891 Long term (current) use of opiate analgesic: Secondary | ICD-10-CM | POA: Diagnosis not present

## 2016-07-06 DIAGNOSIS — M5134 Other intervertebral disc degeneration, thoracic region: Secondary | ICD-10-CM | POA: Diagnosis not present

## 2016-07-06 DIAGNOSIS — Z8249 Family history of ischemic heart disease and other diseases of the circulatory system: Secondary | ICD-10-CM | POA: Diagnosis not present

## 2016-07-06 DIAGNOSIS — N400 Enlarged prostate without lower urinary tract symptoms: Secondary | ICD-10-CM | POA: Diagnosis not present

## 2016-07-06 DIAGNOSIS — M546 Pain in thoracic spine: Secondary | ICD-10-CM

## 2016-07-06 MED ORDER — OXYCODONE HCL 10 MG PO TABS: 10.0000 mg | ORAL_TABLET | Freq: Three times a day (TID) | ORAL | 0 refills | Status: DC

## 2016-07-06 NOTE — Patient Instructions (Signed)
You were given one prescription for Oxycodone today. 

## 2016-07-06 NOTE — Progress Notes (Signed)
Nursing Pain Medication Assessment:  Safety precautions to be maintained throughout the outpatient stay will include: orient to surroundings, keep bed in low position, maintain call bell within reach at all times, provide assistance with transfer out of bed and ambulation.  Medication Inspection Compliance: Pill count conducted under aseptic conditions, in front of the patient. Neither the pills nor the bottle was removed from the patient's sight at any time. Once count was completed pills were immediately returned to the patient in their original bottle.  Medication: Oxycodone IR Pill/Patch Count: 73 of 120 pills remain Pill/Patch Appearance: Markings consistent with prescribed medication Bottle Appearance: Standard pharmacy container. Clearly labeled. Filled Date:06/14/ 2018 Last Medication intake:  Today

## 2016-07-06 NOTE — Progress Notes (Signed)
Subjective:  Patient ID: Timothy Knapp, male    DOB: 1940-07-15  Age: 76 y.o. MRN: [id number]  CC: Back Pain (lower)   Service Provided on Last Visit: Med Refill PROCEDURE:None  HPI Timothy Knapp presents for reevaluation last seen in May. He reports that the quality characteristic and distribution of his right side mid thoracic flank pain is stable in nature. He may even have shown some mild improvement. He has been able to wean his Elavil to once a day and he has decreased his Neurontin to 400 mg twice a day. He still taking his oxycodone at 10 mg 3 times a day. This seems to be working well for him and based on his narcotic assessment sheet this seems to be giving him improved overall lifestyle function and he states that this is the most effective medication for him. He denies diverting or illicit use. His urine drug screen was appropriate. We have reviewed the [facility] practitioner database and it is appropriate.  By history He is referred by Dr. Jeananne Rama for evaluation secondary to right side chronic thoracic postherpetic neuralgia. He describes a chronic aching burning pain that arises in the posterior back and radiates under the rib margin anterior in the abdominal wall to about the umbilicus region. He denies any active blisters and has been diagnosed with postherpetic neuralgia following an initial zoster virus breakout several years ago. He has had an extensive evaluation and workup and has had injection therapy including intercostal nerve blocks and thoracic epidurals but despite this and other modalities of conservative therapy he continues to have pain. He's been on multiple medications that have been reviewed today and is only give him partial relief. Consequently, he has been placed on chronic opioid therapy and he presents today for evaluation and assistance with opioid management from Dr. Janae Bridgeman. The quality characteristic addition patient of his right side thoracic pain a  been stable over the last many months and generally he gets significant relief with oxycodone at the 10 mg strength taking this approximately 3 times a day. This regimen seems to work well for him and when he is not taking the medications he is in severe pain and has had few side effects with the medication. We have reviewed his [facility] and it is appropriate. In the past he's been seen here in the pain clinic by Dr. crisp. He denies upper extremity weakness.  History Timothy Knapp has a past medical history of BPH (benign prostatic hypertrophy); Hyperlipidemia; and Hypertension.   He has a past surgical history that includes Prostate surgery (N/A) and Skin cancer excision (11/09/2015).   His family history includes Heart disease in his mother.He reports that he has never smoked. He has never used smokeless tobacco. He reports that he does not drink alcohol or use drugs.    Tox[employer]ure Select 13  Date Value Ref Range Status  06/02/2015 FINAL  Final    Comment:    ==================================================================== TOXASSURE SELECT 13 (MW) ==================================================================== Test                             Result       Flag       Units Drug Present and Declared for Prescription Verification   Oxycodone                      833          EXPECTED  ng/mg creat   Oxymorphone                    2581         EXPECTED   ng/mg creat   Noroxycodone                   3605         EXPECTED   ng/mg creat   Noroxymorphone                 669          EXPECTED   ng/mg creat    Sources of oxycodone are scheduled prescription medications.    Oxymorphone, noroxycodone, and noroxymorphone are expected    metabolites of oxycodone. Oxymorphone is also available as a    scheduled prescription medication. ==================================================================== Test                      Result    Flag   Units      Ref Range    Creatinine              149              mg/dL      >=20 ==================================================================== Declared Medications:  The flagging and interpretation on this report are based on the  following declared medications.  Unexpected results may arise from  inaccuracies in the declared medications.  **Note: The testing scope of this panel includes these medications:  Oxycodone (Oxy-IR)  **Note: The testing scope of this panel does not include following  reported medications:  Amitriptyline (Elavil)  Aspirin (Aspirin 81)  Atorvastatin (Lipitor)  Benazepril (Lotensin)  Capsaicin  Duloxetine (Cymbalta)  Gabapentin (Neurontin)  Herbal Product  Lidocaine (Lidoderm)  Magnesium (Mag)  Multivitamin (Metanx)  Prasterone  Supplement  Supplement (Resveratrol)  Turmeric  Ubiquinone (Coenzyme Q 10)  Vitamin B  Vitamin D3  Zinc ==================================================================== For clinical consultation, please call [phone]. ====================================================================     Outpatient Medications Prior to Visit  Medication Sig Dispense Refill  . amitriptyline (ELAVIL) 10 MG tablet Limit 1 - 3 tablets by mouth at bedtime if tolerated 90 tablet 0  . Apple Cider Vinegar 500 MG TABS Take 450 mg by mouth.    [name] aspirin 81 MG tablet Take 81 mg by mouth daily. Reported on 06/13/2015    . atorvastatin (LIPITOR) 40 MG tablet Take 1 tablet (40 mg total) by mouth at bedtime. (Patient taking differently: Take 20 mg by mouth at bedtime. ) 90 tablet 4  . b complex vitamins tablet Take 1 tablet by mouth daily.     . benazepril (LOTENSIN) 20 MG tablet Take 1 tablet (20 mg total) by mouth daily. 90 tablet 4  . diclofenac sodium (VOLTAREN) 1 % GEL Apply 1 g topically every 6 (six) hours as needed.    . gabapentin (NEURONTIN) 800 MG tablet Limited 1-2 tabs by mouth per day if tolerated 60 tablet 2  . Turmeric 500 MG CAPS Take 500 mg by  mouth 2 (two) times daily. Reported on 05/30/2015    . Capsaicin (CAPZASIN EX) Apply 0.025 % topically 2 (two) times daily.    . DULoxetine (CYMBALTA) 30 MG capsule Limit 1 tablet by mouth per day if tolerated (Patient not taking: Reported on 07/06/2016) 30 capsule 0  . glucosamine-chondroitin 500-400 MG tablet Take 1 tablet by mouth daily.    [name] oxyCODONE-acetaminophen (PERCOCET) 10-325 MG tablet Take 1 tablet  by mouth every 8 (eight) hours as needed for pain.     No facility-administered medications prior to visit.    Lab Results  Component Value Date   WBC 7.7 12/05/2015   HGB 14.5 12/05/2015   HCT 43.9 12/05/2015   PLT 231 12/05/2015   GLUCOSE 124 (H) 05/28/2016   CHOL 161 05/28/2016   TRIG 125 05/28/2016   HDL 49 12/05/2015   LDLCALC 102 (H) 12/05/2015   ALT 31 05/28/2016   AST 50 (H) 05/28/2016   NA 136 05/28/2016   K 3.9 05/28/2016   CL 101 05/28/2016   CREATININE 0.83 05/28/2016   BUN 18 05/28/2016   CO2 20 05/28/2016   TSH 2.490 12/05/2015    --------------------------------------------------------------------------------------------------------------------- Dg Foot Complete Right  Result Date: 04/28/2016 CLINICAL DATA:  Patient dropped heavy object onto foot 5 days prior. Extensive bruising. Pain in the lateral foot. Initial encounter. EXAM: RIGHT FOOT COMPLETE - 3+ VIEW COMPARISON:  None. FINDINGS: Hallux valgus deformity. First MTP joint degenerative changes. Midfoot degenerative changes. No evidence for acute fracture or dislocation. IMPRESSION: No acute osseous abnormality.  First MTP joint degenerative changes. Electronically Signed   By: Lovey Newcomer M.D.   On: 04/28/2016 12:21       ---------------------------------------------------------------------------------------------------------------------- Past Medical History:  Diagnosis Date  . BPH (benign prostatic hypertrophy)   . Hyperlipidemia   . Hypertension     Past Surgical History:  Procedure  Laterality Date  . PROSTATE SURGERY N/A   . SKIN CANCER EXCISION  11/09/2015    Family History  Problem Relation Age of Onset  . Heart disease Mother     Social History  Substance Use Topics  . Smoking status: Never Smoker  . Smokeless tobacco: Never Used  . Alcohol use No    ---------------------------------------------------------------------------------------------------------------------  BP 125/72   Pulse (!) 55   Temp 97.8 F (36.6 C) (Oral)   Resp 16   Ht 5\' 8"  (1.727 m)   Wt 155 lb (70.3 kg)   SpO2 98%   BMI 23.57 kg/m    BP Readings from Last 3 Encounters:  07/06/16 125/72  05/28/16 125/71  05/24/16 123/64     Wt Readings from Last 3 Encounters:  07/06/16 155 lb (70.3 kg)  05/28/16 165 lb (74.8 kg)  05/24/16 155 lb (70.3 kg)     ----------------------------------------------------------------------------------------------------------------------  ROS Review of Systems  Cardiac: No angina or palpitations Pulmonary: No shortness of breath GI: No constipation GU: No hematuria or hesitancy  Objective:  BP 125/72   Pulse (!) 55   Temp 97.8 F (36.6 C) (Oral)   Resp 16   Ht 5\' 8"  (1.727 m)   Wt 155 lb (70.3 kg)   SpO2 98%   BMI 23.57 kg/m   Physical Exam Patient is alert oriented cooperative compliant Pupils equally round reactive to light extraocular muscles are intact Heart is regular rate and rhythm Inspection the thoracic back reveals no evidence of any cutaneous erythema or edema scarring or blistering. He does have hyperalgesia and some allodynia in a T8-T10 dermatomal region radiating from posterior to anterior abdominal wall. This appears to be less tender to touch today.     Assessment & Plan:   There are no diagnoses linked to this encounter.   ----------------------------------------------------------------------------------------------------------------------  Problem List Items Addressed This Visit    None       ----------------------------------------------------------------------------------------------------------------------  1. Postherpetic neuralgia Based on the current findings of think his medication regimen is reasonable. I have offered him interventional  therapy but he states that the previous nerve blocks and epidurals were of questionable benefit. Perhaps these can be reserved for an acute exacerbationHis urine drug screen was appropriate and we will refill his medications today. He is to return to clinic in 1 month for reevaluation. Our plan is to reduce his opioid intake quarterly as discussed today. 2. DDD (degenerative disc disease), thoracic As above  3. Chronic right-sided thoracic back pain We did talk about some options for interventional therapy with a dorsal column stimulator and he will consider this but at present is uninterested.     ----------------------------------------------------------------------------------------------------------------------  I have discontinued Mr. Tesler Capsaicin (CAPZASIN EX), DULoxetine, glucosamine-chondroitin, and oxyCODONE-acetaminophen. I have also changed his Oxycodone HCl. Additionally, I am having him maintain his aspirin, b complex vitamins, Turmeric, Apple Cider Vinegar, gabapentin, amitriptyline, benazepril, atorvastatin, and diclofenac sodium.   Meds ordered this encounter  Medications  . DISCONTD: Oxycodone HCl 10 MG TABS    Sig: Take 10 mg by mouth every 6 (six) hours as needed.  . Oxycodone HCl 10 MG TABS    Sig: Take 1 tablet (10 mg total) by mouth 3 (three) times daily.    Dispense:  90 tablet    Refill:  0       Follow-up: Return in about 1 month (around 08/05/2016) for evaluation, med refill.    [name], MD 2:46 PM  The [facility] practitioner database for opioid medications on this patient has been reviewed by me and my staff   Greater than 50% of the total encounter time was spent in counseling and / or  coordination of care.     This dictation was performed utilizing Systems analyst.  Please excuse any unintentional or mistaken typographical errors as a result.

## 2016-07-19 DIAGNOSIS — M25562 Pain in left knee: Secondary | ICD-10-CM | POA: Diagnosis not present

## 2016-07-19 DIAGNOSIS — M25561 Pain in right knee: Secondary | ICD-10-CM | POA: Diagnosis not present

## 2016-07-19 DIAGNOSIS — M170 Bilateral primary osteoarthritis of knee: Secondary | ICD-10-CM | POA: Diagnosis not present

## 2016-07-20 ENCOUNTER — Other Ambulatory Visit: Payer: Self-pay | Admitting: *Deleted

## 2016-07-20 NOTE — Patient Outreach (Signed)
07/18/16 1st attempt- Telephone call to patient for follow up on [place]. No answer to telephone and received message "disconnected"  ([phone]). 07/19/16 2nd attempt- Telephone call to patient for follow up on [place].  No answer to telephone and no option to leave voicemail [phone]).  PLAN Attempt to reach pt next week  Jacqlyn Larsen [facility], [place] Coordinator [phone]

## 2016-07-25 ENCOUNTER — Other Ambulatory Visit: Payer: Self-pay | Admitting: *Deleted

## 2016-07-25 ENCOUNTER — Encounter: Payer: Self-pay | Admitting: *Deleted

## 2016-07-25 NOTE — Patient Outreach (Signed)
Telephone call to patient for follow up on [place]. No answer to telephone [phone] and no option to leave voicemail.  RN CM mailed unsuccessful outreach letter to pt home requesting return phone call.  Will keep case open 10 days.  Jacqlyn Larsen [facility], [place] Coordinator [phone]

## 2016-08-06 ENCOUNTER — Other Ambulatory Visit: Payer: Self-pay | Admitting: *Deleted

## 2016-08-06 NOTE — Patient Outreach (Signed)
Pt did not respond to letter mailed to patient's home.  Case closed.  Jacqlyn Larsen [facility], [place], [place] Coordinator [phone]

## 2016-08-08 ENCOUNTER — Ambulatory Visit: Payer: PPO | Attending: Anesthesiology | Admitting: Anesthesiology

## 2016-08-08 ENCOUNTER — Encounter: Payer: Self-pay | Admitting: Anesthesiology

## 2016-08-08 VITALS — BP 133/66 | HR 50 | Temp 98.1°F | Resp 16 | Ht 68.0 in | Wt 155.0 lb

## 2016-08-08 DIAGNOSIS — Z7982 Long term (current) use of aspirin: Secondary | ICD-10-CM | POA: Insufficient documentation

## 2016-08-08 DIAGNOSIS — R109 Unspecified abdominal pain: Secondary | ICD-10-CM | POA: Diagnosis not present

## 2016-08-08 DIAGNOSIS — B0229 Other postherpetic nervous system involvement: Secondary | ICD-10-CM | POA: Insufficient documentation

## 2016-08-08 DIAGNOSIS — I10 Essential (primary) hypertension: Secondary | ICD-10-CM | POA: Diagnosis not present

## 2016-08-08 DIAGNOSIS — G8929 Other chronic pain: Secondary | ICD-10-CM | POA: Diagnosis not present

## 2016-08-08 DIAGNOSIS — N400 Enlarged prostate without lower urinary tract symptoms: Secondary | ICD-10-CM | POA: Insufficient documentation

## 2016-08-08 DIAGNOSIS — E785 Hyperlipidemia, unspecified: Secondary | ICD-10-CM | POA: Insufficient documentation

## 2016-08-08 DIAGNOSIS — M5134 Other intervertebral disc degeneration, thoracic region: Secondary | ICD-10-CM | POA: Insufficient documentation

## 2016-08-08 DIAGNOSIS — Z8249 Family history of ischemic heart disease and other diseases of the circulatory system: Secondary | ICD-10-CM | POA: Insufficient documentation

## 2016-08-08 DIAGNOSIS — Z79899 Other long term (current) drug therapy: Secondary | ICD-10-CM | POA: Insufficient documentation

## 2016-08-08 DIAGNOSIS — M546 Pain in thoracic spine: Secondary | ICD-10-CM

## 2016-08-08 DIAGNOSIS — M545 Low back pain: Secondary | ICD-10-CM | POA: Diagnosis present

## 2016-08-08 DIAGNOSIS — Z85828 Personal history of other malignant neoplasm of skin: Secondary | ICD-10-CM | POA: Diagnosis not present

## 2016-08-08 MED ORDER — OXYCODONE HCL 10 MG PO TABS: 10.0000 mg | ORAL_TABLET | Freq: Three times a day (TID) | ORAL | 0 refills | Status: DC

## 2016-08-08 NOTE — Progress Notes (Signed)
Subjective:  Patient ID: Timothy Knapp, male    DOB: Apr 28, 1940  Age: 76 y.o. MRN: [id number]  CC: Back Pain (lower)   Service Provided on Last Visit: Med Refill PROCEDURE:None  HPI Timothy Knapp presents for reevaluation today. He was last seen 1 month ago. He is a patient of Dr. crisp and has been followed here in the pain clinic for over 3 years. At present the quality characteristic and distribution of his symptoms remain stable with no change in symptom complex noted today. He's taking his oxycodone 3 times a day and tolerating this well. I have reviewed his narcotic assessment sheet and it is appropriate. He is primarily complaining of flank pain and some low back pain but this is been stable in nature. No change in lower extremity strength or function or bowel bladder function is noted. We will also review the [employer] [facility] practitioner database information and it is appropriate as well. He did recently stop his tricyclic antidepressant.   By history He is referred by Dr. Jeananne Rama for evaluation secondary to right side chronic thoracic postherpetic neuralgia. He describes a chronic aching burning pain that arises in the posterior back and radiates under the rib margin anterior in the abdominal wall to about the umbilicus region. He denies any active blisters and has been diagnosed with postherpetic neuralgia following an initial zoster virus breakout several years ago. He has had an extensive evaluation and workup and has had injection therapy including intercostal nerve blocks and thoracic epidurals but despite this and other modalities of conservative therapy he continues to have pain. He's been on multiple medications that have been reviewed today and is only give him partial relief. Consequently, he has been placed on chronic opioid therapy and he presents today for evaluation and assistance with opioid management from Dr. Janae Bridgeman. The quality characteristic addition patient of his  right side thoracic pain a been stable over the last many months and generally he gets significant relief with oxycodone at the 10 mg strength taking this approximately 3 times a day. This regimen seems to work well for him and when he is not taking the medications he is in severe pain and has had few side effects with the medication. We have reviewed his [facility] and it is appropriate. In the past he's been seen here in the pain clinic by Dr. crisp. He denies upper extremity weakness.  History Timothy Knapp has a past medical history of BPH (benign prostatic hypertrophy); Hyperlipidemia; and Hypertension.   He has a past surgical history that includes Prostate surgery (N/A) and Skin cancer excision (11/09/2015).   His family history includes Heart disease in his mother.He reports that he has never smoked. He has never used smokeless tobacco. He reports that he does not drink alcohol or use drugs.    ToxAssure Select 13  Date Value Ref Range Status  06/02/2015 FINAL  Final    Comment:    ==================================================================== TOXASSURE SELECT 13 (MW) ==================================================================== Test                             Result       Flag       Units Drug Present and Declared for Prescription Verification   Oxycodone                      833          EXPECTED  ng/mg creat   Oxymorphone                    2581         EXPECTED   ng/mg creat   Noroxycodone                   3605         EXPECTED   ng/mg creat   Noroxymorphone                 669          EXPECTED   ng/mg creat    Sources of oxycodone are scheduled prescription medications.    Oxymorphone, noroxycodone, and noroxymorphone are expected    metabolites of oxycodone. Oxymorphone is also available as a    scheduled prescription medication. ==================================================================== Test                      Result    Flag    Units      Ref Range   Creatinine              149              mg/dL      >=20 ==================================================================== Declared Medications:  The flagging and interpretation on this report are based on the  following declared medications.  Unexpected results may arise from  inaccuracies in the declared medications.  **Note: The testing scope of this panel includes these medications:  Oxycodone (Oxy-IR)  **Note: The testing scope of this panel does not include following  reported medications:  Amitriptyline (Elavil)  Aspirin (Aspirin 81)  Atorvastatin (Lipitor)  Benazepril (Lotensin)  Capsaicin  Duloxetine (Cymbalta)  Gabapentin (Neurontin)  Herbal Product  Lidocaine (Lidoderm)  Magnesium (Mag)  Multivitamin (Metanx)  Prasterone  Supplement  Supplement (Resveratrol)  Turmeric  Ubiquinone (Coenzyme Q 10)  Vitamin B  Vitamin D3  Zinc ==================================================================== For clinical consultation, please call [phone]. ====================================================================     Outpatient Medications Prior to Visit  Medication Sig Dispense Refill  . amitriptyline (ELAVIL) 10 MG tablet Limit 1 - 3 tablets by mouth at bedtime if tolerated 90 tablet 0  . Apple Cider Vinegar 500 MG TABS Take 450 mg by mouth.    [name] aspirin 81 MG tablet Take 81 mg by mouth daily. Reported on 06/13/2015    . atorvastatin (LIPITOR) 40 MG tablet Take 1 tablet (40 mg total) by mouth at bedtime. (Patient taking differently: Take 20 mg by mouth at bedtime. ) 90 tablet 4  . b complex vitamins tablet Take 1 tablet by mouth daily.     . benazepril (LOTENSIN) 20 MG tablet Take 1 tablet (20 mg total) by mouth daily. 90 tablet 4  . diclofenac sodium (VOLTAREN) 1 % GEL Apply 1 g topically every 6 (six) hours as needed.    . gabapentin (NEURONTIN) 800 MG tablet Limited 1-2 tabs by mouth per day if tolerated 60 tablet 2  . Turmeric  500 MG CAPS Take 500 mg by mouth 2 (two) times daily. Reported on 05/30/2015    . Oxycodone HCl 10 MG TABS Take 1 tablet (10 mg total) by mouth 3 (three) times daily. 90 tablet 0   No facility-administered medications prior to visit.    Lab Results  Component Value Date   WBC 7.7 12/05/2015   HGB 14.5 12/05/2015   HCT 43.9 12/05/2015   PLT 231 12/05/2015   GLUCOSE 124 (H)  05/28/2016   CHOL 161 05/28/2016   TRIG 125 05/28/2016   HDL 49 12/05/2015   LDLCALC 102 (H) 12/05/2015   ALT 31 05/28/2016   AST 50 (H) 05/28/2016   NA 136 05/28/2016   K 3.9 05/28/2016   CL 101 05/28/2016   CREATININE 0.83 05/28/2016   BUN 18 05/28/2016   CO2 20 05/28/2016   TSH 2.490 12/05/2015    --------------------------------------------------------------------------------------------------------------------- Dg Foot Complete Right  Result Date: 04/28/2016 CLINICAL DATA:  Patient dropped heavy object onto foot 5 days prior. Extensive bruising. Pain in the lateral foot. Initial encounter. EXAM: RIGHT FOOT COMPLETE - 3+ VIEW COMPARISON:  None. FINDINGS: Hallux valgus deformity. First MTP joint degenerative changes. Midfoot degenerative changes. No evidence for acute fracture or dislocation. IMPRESSION: No acute osseous abnormality.  First MTP joint degenerative changes. Electronically Signed   By: Lovey Newcomer M.D.   On: 04/28/2016 12:21       ---------------------------------------------------------------------------------------------------------------------- Past Medical History:  Diagnosis Date  . BPH (benign prostatic hypertrophy)   . Hyperlipidemia   . Hypertension     Past Surgical History:  Procedure Laterality Date  . PROSTATE SURGERY N/A   . SKIN CANCER EXCISION  11/09/2015    Family History  Problem Relation Age of Onset  . Heart disease Mother     Social History  Substance Use Topics  . Smoking status: Never Smoker  . Smokeless tobacco: Never Used  . Alcohol use No     ---------------------------------------------------------------------------------------------------------------------  BP 133/66   Pulse (!) 50   Temp 98.1 F (36.7 C) (Oral)   Resp 16   Ht 5\' 8"  (1.727 m)   Wt 155 lb (70.3 kg)   SpO2 97%   BMI 23.57 kg/m    BP Readings from Last 3 Encounters:  08/08/16 133/66  07/06/16 125/72  05/28/16 125/71     Wt Readings from Last 3 Encounters:  08/08/16 155 lb (70.3 kg)  07/06/16 155 lb (70.3 kg)  05/28/16 165 lb (74.8 kg)     ----------------------------------------------------------------------------------------------------------------------  ROS Review of Systems  GI: No constipation or diarrhea Cardiac: No angina or shortness breath Neuro: No dizziness or excess sedation  Objective:  BP 133/66   Pulse (!) 50   Temp 98.1 F (36.7 C) (Oral)   Resp 16   Ht 5\' 8"  (1.727 m)   Wt 155 lb (70.3 kg)   SpO2 97%   BMI 23.57 kg/m   Physical Exam Patient is alert oriented cooperative compliant Pupils equally round reactive to light extraocular muscles are intact Heart is regular rate and rhythm Low back reveals some paraspinous muscle tenderness. He is a mildly antalgic gait but seems to be moving around well. His muscle tone and bulk is good.   Assessment & Plan:   Timothy Knapp was seen today for back pain.  Diagnoses and all orders for this visit:  Postherpetic neuralgia  Chronic right-sided thoracic back pain  DDD (degenerative disc disease), thoracic  Other orders -     Discontinue: Oxycodone HCl 10 MG TABS; Take 1 tablet (10 mg total) by mouth 3 (three) times daily. -     Oxycodone HCl 10 MG TABS; Take 1 tablet (10 mg total) by mouth 3 (three) times daily.     ----------------------------------------------------------------------------------------------------------------------  Problem List Items Addressed This Visit      Nervous and Auditory   Postherpetic neuralgia - Primary     Musculoskeletal and  Integument   DDD (degenerative disc disease), thoracic   Relevant Medications  Oxycodone HCl 10 MG TABS    Other Visit Diagnoses    Chronic right-sided thoracic back pain       Relevant Medications   Oxycodone HCl 10 MG TABS      ----------------------------------------------------------------------------------------------------------------------  1. Postherpetic neuralgia At present he seems to be doing well with his current regimen. We have reviewed the narcotic assessment sheet he is tolerating his medications without difficulty. As such we will have him return to clinic in 1 month for reevaluation. Prescription refill was given as well. 2. DDD (degenerative disc disease), thoracic As above  3. Chronic right-sided thoracic back pain We did talk about some options for interventional therapy with a dorsal column stimulator and he will consider this but at present is uninterested.     ----------------------------------------------------------------------------------------------------------------------  I am having Timothy Knapp maintain his aspirin, b complex vitamins, Turmeric, Apple Cider Vinegar, gabapentin, amitriptyline, benazepril, atorvastatin, diclofenac sodium, and Oxycodone HCl.   Meds ordered this encounter  Medications  . DISCONTD: Oxycodone HCl 10 MG TABS    Sig: Take 1 tablet (10 mg total) by mouth 3 (three) times daily.    Dispense:  90 tablet    Refill:  0    Do not fill until [id number]  . Oxycodone HCl 10 MG TABS    Sig: Take 1 tablet (10 mg total) by mouth 3 (three) times daily.    Dispense:  90 tablet    Refill:  0    Do not fill until [id number]       Follow-up: Return in about 2 months (around 10/08/2016) for evaluation, med refill.    [name], MD 2:01 PM  The [facility] practitioner database for opioid medications on this patient has been reviewed by me and my staff   Greater than 50% of the total encounter time was spent in counseling and / or  coordination of care.     This dictation was performed utilizing Systems analyst.  Please excuse any unintentional or mistaken typographical errors as a result.

## 2016-08-08 NOTE — Progress Notes (Signed)
Nursing Pain Medication Assessment:  Safety precautions to be maintained throughout the outpatient stay will include: orient to surroundings, keep bed in low position, maintain call bell within reach at all times, provide assistance with transfer out of bed and ambulation.  Medication Inspection Compliance: Pill count conducted under aseptic conditions, in front of the patient. Neither the pills nor the bottle was removed from the patient's sight at any time. Once count was completed pills were immediately returned to the patient in their original bottle.  Medication: Oxycodone IR Pill/Patch Count: 27 of 90 pills remain Pill/Patch Appearance: Markings consistent with prescribed medication Bottle Appearance: Standard pharmacy container. Clearly labeled. Filled Date:07/09/ 2018 Last Medication intake:  Today

## 2016-09-06 DIAGNOSIS — M170 Bilateral primary osteoarthritis of knee: Secondary | ICD-10-CM | POA: Diagnosis not present

## 2016-10-17 ENCOUNTER — Ambulatory Visit: Payer: PPO | Attending: Anesthesiology | Admitting: Anesthesiology

## 2016-10-17 ENCOUNTER — Encounter: Payer: Self-pay | Admitting: Anesthesiology

## 2016-10-17 VITALS — BP 131/70 | HR 61 | Temp 98.3°F | Resp 16 | Ht 68.0 in | Wt 155.0 lb

## 2016-10-17 DIAGNOSIS — Z7982 Long term (current) use of aspirin: Secondary | ICD-10-CM | POA: Diagnosis not present

## 2016-10-17 DIAGNOSIS — B0229 Other postherpetic nervous system involvement: Secondary | ICD-10-CM | POA: Diagnosis not present

## 2016-10-17 DIAGNOSIS — M5134 Other intervertebral disc degeneration, thoracic region: Secondary | ICD-10-CM | POA: Insufficient documentation

## 2016-10-17 DIAGNOSIS — G8929 Other chronic pain: Secondary | ICD-10-CM | POA: Diagnosis not present

## 2016-10-17 DIAGNOSIS — M546 Pain in thoracic spine: Secondary | ICD-10-CM | POA: Insufficient documentation

## 2016-10-17 DIAGNOSIS — F1190 Opioid use, unspecified, uncomplicated: Secondary | ICD-10-CM

## 2016-10-17 DIAGNOSIS — Z79891 Long term (current) use of opiate analgesic: Secondary | ICD-10-CM | POA: Insufficient documentation

## 2016-10-17 DIAGNOSIS — M2010 Hallux valgus (acquired), unspecified foot: Secondary | ICD-10-CM | POA: Insufficient documentation

## 2016-10-17 DIAGNOSIS — M545 Low back pain: Secondary | ICD-10-CM | POA: Diagnosis not present

## 2016-10-17 MED ORDER — OXYCODONE HCL 10 MG PO TABS: 10.0000 mg | ORAL_TABLET | Freq: Three times a day (TID) | ORAL | 0 refills | Status: DC

## 2016-10-17 NOTE — Progress Notes (Signed)
Subjective:  Patient ID: Timothy Knapp, male    DOB: 09-21-1940  Age: 76 y.o. MRN: [id number]  CC: Back Pain (lower)   Procedure: None   HPI Timothy Knapp presents for reevaluation. He is doing well with his current medication regimen. He states he has oxycodone at 10 mg strength 3 times a day and this is working well for him. The quality characteristic and distribution of his low back pain has been stable in nature. He also has a history of right-sided thoracic pain and postherpetic neuralgia which is also doing well with his medicine regimen. The quality characteristic distribution of these pains has been stable in nature. Based on his narcotic assessment sheet he continues to derive good lifestyle functional improvement with his medications. He has been compliant with no evidence of diverting or illicit use.  Outpatient Medications Prior to Visit  Medication Sig Dispense Refill  . atorvastatin (LIPITOR) 40 MG tablet Take 1 tablet (40 mg total) by mouth at bedtime. (Patient taking differently: Take 20 mg by mouth at bedtime. ) 90 tablet 4  . b complex vitamins tablet Take 1 tablet by mouth daily.     . benazepril (LOTENSIN) 20 MG tablet Take 1 tablet (20 mg total) by mouth daily. 90 tablet 4  . diclofenac sodium (VOLTAREN) 1 % GEL Apply 1 g topically every 6 (six) hours as needed.    . gabapentin (NEURONTIN) 800 MG tablet Limited 1-2 tabs by mouth per day if tolerated 60 tablet 2  . Turmeric 500 MG CAPS Take 500 mg by mouth 2 (two) times daily. Reported on 05/30/2015    . Oxycodone HCl 10 MG TABS Take 1 tablet (10 mg total) by mouth 3 (three) times daily. 90 tablet 0  . amitriptyline (ELAVIL) 10 MG tablet Limit 1 - 3 tablets by mouth at bedtime if tolerated (Patient not taking: Reported on 10/17/2016) 90 tablet 0  . Apple Cider Vinegar 500 MG TABS Take 450 mg by mouth.    [name] aspirin 81 MG tablet Take 81 mg by mouth daily. Reported on 06/13/2015     No facility-administered medications prior  to visit.     Review of Systems CNS: No sedation or confusion GI: No constipation or abdominal pain Constitutional no fevers or chills  Objective:  BP 131/70   Pulse 61   Temp 98.3 F (36.8 C) (Oral)   Resp 16   Ht 5\' 8"  (1.727 m)   Wt 155 lb (70.3 kg)   SpO2 96%   BMI 23.57 kg/m    BP Readings from Last 3 Encounters:  10/17/16 131/70  08/08/16 133/66  07/06/16 125/72     Wt Readings from Last 3 Encounters:  10/17/16 155 lb (70.3 kg)  08/08/16 155 lb (70.3 kg)  07/06/16 155 lb (70.3 kg)     Physical Exam Pt is alert and oriented PERRL EOMI HEART IS RRR no murmur or rub LCTA no wheezing or rhales MUSCULOSKELETALReveals some persistent paraspinous muscle tenderness. He is ambulating well with a slightly antalgic gait. His muscle tone and bulk appears to be at baseline.  Labs  No results found for: HGBA1C Lab Results  Component Value Date   LDLCALC 102 (H) 12/05/2015   CREATININE 0.83 05/28/2016    -------------------------------------------------------------------------------------------------------------------- Lab Results  Component Value Date   WBC 7.7 12/05/2015   HGB 14.5 12/05/2015   HCT 43.9 12/05/2015   PLT 231 12/05/2015   GLUCOSE 124 (H) 05/28/2016   CHOL 161 05/28/2016  TRIG 125 05/28/2016   HDL 49 12/05/2015   LDLCALC 102 (H) 12/05/2015   ALT 31 05/28/2016   AST 50 (H) 05/28/2016   NA 136 05/28/2016   K 3.9 05/28/2016   CL 101 05/28/2016   CREATININE 0.83 05/28/2016   BUN 18 05/28/2016   CO2 20 05/28/2016   TSH 2.490 12/05/2015    --------------------------------------------------------------------------------------------------------------------- Dg Foot Complete Right  Result Date: 04/28/2016 CLINICAL DATA:  Patient dropped heavy object onto foot 5 days prior. Extensive bruising. Pain in the lateral foot. Initial encounter. EXAM: RIGHT FOOT COMPLETE - 3+ VIEW COMPARISON:  None. FINDINGS: Hallux valgus deformity. First MTP joint  degenerative changes. Midfoot degenerative changes. No evidence for acute fracture or dislocation. IMPRESSION: No acute osseous abnormality.  First MTP joint degenerative changes. Electronically Signed   By: Lovey Newcomer M.D.   On: 04/28/2016 12:21     Assessment & Plan:   Timothy Knapp was seen today for back pain.  Diagnoses and all orders for this visit:  Postherpetic neuralgia  Chronic right-sided thoracic back pain  DDD (degenerative disc disease), thoracic  Chronic, continuous use of opioids  Other orders -     Discontinue: Oxycodone HCl 10 MG TABS; Take 1 tablet (10 mg total) by mouth 3 (three) times daily. -     Oxycodone HCl 10 MG TABS; Take 1 tablet (10 mg total) by mouth 3 (three) times daily.        ----------------------------------------------------------------------------------------------------------------------  Problem List Items Addressed This Visit      Nervous and Auditory   Postherpetic neuralgia - Primary     Musculoskeletal and Integument   DDD (degenerative disc disease), thoracic   Relevant Medications   Oxycodone HCl 10 MG TABS    Other Visit Diagnoses    Chronic right-sided thoracic back pain       Relevant Medications   Oxycodone HCl 10 MG TABS   Chronic, continuous use of opioids            ----------------------------------------------------------------------------------------------------------------------  1. Postherpetic neuralgia We'll keep him on his current regimen at oxycodone 10 mg 3 times a day. He seems to be helping and unfortunately his failed conservative therapy. He also has chronic diffuse pain in the thoracic and lumbar region and this is also remedying this to. We reviewed to [facility] practitioner database information and it is appropriate. We did discuss options about reducing his usage to a gradual reduction to 7.5 mg tablets 3 times a day. Currently we are refilling for October 9 and November 8 with return to clinic in  2 months  2. Chronic right-sided thoracic back pain As above  3. DDD (degenerative disc disease), thoracic As above  4. Chronic, continuous use of opioids As above. He does have a Dr. Marry Guan total knee replacement scheduled for December and has brought this to her attention regarding possible administration of additional opioids for acute postoperative pain.    ----------------------------------------------------------------------------------------------------------------------  I am having Timothy Knapp maintain his aspirin, b complex vitamins, Turmeric, Apple Cider Vinegar, gabapentin, amitriptyline, benazepril, atorvastatin, diclofenac sodium, glucosamine-chondroitin, LYSINE PO, NON FORMULARY, and Oxycodone HCl.   Meds ordered this encounter  Medications  . glucosamine-chondroitin 500-400 MG tablet    Sig: Take 1 tablet by mouth 3 (three) times daily.  [name] LYSINE PO    Sig: Take 100 mg by mouth daily.  . NON FORMULARY    Sig: daily.  [name] DISCONTD: Oxycodone HCl 10 MG TABS    Sig: Take 1 tablet (10 mg total)  by mouth 3 (three) times daily.    Dispense:  90 tablet    Refill:  0  . Oxycodone HCl 10 MG TABS    Sig: Take 1 tablet (10 mg total) by mouth 3 (three) times daily.    Dispense:  90 tablet    Refill:  0    Do not fill until [id number]   Patient's Medications  New Prescriptions   No medications on file  Previous Medications   AMITRIPTYLINE (ELAVIL) 10 MG TABLET    Limit 1 - 3 tablets by mouth at bedtime if tolerated   APPLE CIDER VINEGAR 500 MG TABS    Take 450 mg by mouth.   ASPIRIN 81 MG TABLET    Take 81 mg by mouth daily. Reported on 06/13/2015   ATORVASTATIN (LIPITOR) 40 MG TABLET    Take 1 tablet (40 mg total) by mouth at bedtime.   B COMPLEX VITAMINS TABLET    Take 1 tablet by mouth daily.    BENAZEPRIL (LOTENSIN) 20 MG TABLET    Take 1 tablet (20 mg total) by mouth daily.   DICLOFENAC SODIUM (VOLTAREN) 1 % GEL    Apply 1 g topically every 6 (six) hours as needed.    GABAPENTIN (NEURONTIN) 800 MG TABLET    Limited 1-2 tabs by mouth per day if tolerated   GLUCOSAMINE-CHONDROITIN 500-400 MG TABLET    Take 1 tablet by mouth 3 (three) times daily.   LYSINE PO    Take 100 mg by mouth daily.   NON FORMULARY    daily.   TURMERIC 500 MG CAPS    Take 500 mg by mouth 2 (two) times daily. Reported on 05/30/2015  Modified Medications   Modified Medication Previous Medication   OXYCODONE HCL 10 MG TABS Oxycodone HCl 10 MG TABS      Take 1 tablet (10 mg total) by mouth 3 (three) times daily.    Take 1 tablet (10 mg total) by mouth 3 (three) times daily.  Discontinued Medications   No medications on file   ----------------------------------------------------------------------------------------------------------------------  Follow-up: Return in about 2 months (around 12/17/2016) for evaluation, med refill.    [name], MD

## 2016-10-17 NOTE — Patient Instructions (Signed)
You were given 2 prescriptions for Oxycodone today. 

## 2016-10-17 NOTE — Progress Notes (Signed)
Nursing Pain Medication Assessment:  Safety precautions to be maintained throughout the outpatient stay will include: orient to surroundings, keep bed in low position, maintain call bell within reach at all times, provide assistance with transfer out of bed and ambulation.  Medication Inspection Compliance: Pill count conducted under aseptic conditions, in front of the patient. Neither the pills nor the bottle was removed from the patient's sight at any time. Once count was completed pills were immediately returned to the patient in their original bottle.  Medication: Oxycodone IR Pill/Patch Count: 0 of 90 pills remain Pill/Patch Appearance: Markings consistent with prescribed medication Bottle Appearance: Standard pharmacy container. Clearly labeled. Filled Date: 09/08 / 2018 Last Medication intake:  Today

## 2016-11-12 ENCOUNTER — Telehealth: Payer: Self-pay | Admitting: Family Medicine

## 2016-11-12 DIAGNOSIS — I10 Essential (primary) hypertension: Secondary | ICD-10-CM

## 2016-11-12 MED ORDER — BENAZEPRIL HCL 20 MG PO TABS: 20.0000 mg | ORAL_TABLET | Freq: Every day | ORAL | 1 refills | Status: DC

## 2016-11-12 NOTE — Telephone Encounter (Signed)
Return phone call given to pt to confirm the pt's preferred pharmacy. Pt prefers refill to be sent to [place]. [employer]. Informed pt that refill would be sent to [facility] as requested and to call the pharmacy to insure it was ready prior to going to pick the medication up.

## 2016-11-12 NOTE — Telephone Encounter (Signed)
Copied from [place] [phone]. Topic: Inquiry >> Nov 12, 2016 11:07 AM [name] wrote: Reason for CRM: Patient called requesting a refill of Benazepril. Patient stated that he is out of the medication, and has no more refills. Patient also stated that he needs enough of the medication to last through January 2019, as his next appt with Dr. Jeananne Rama is not until that time. Patient would like a call back about this issue today. Thank You!!!

## 2016-11-14 ENCOUNTER — Telehealth: Payer: Self-pay | Admitting: Family Medicine

## 2016-11-14 NOTE — Telephone Encounter (Signed)
Pt declined AWV with [facility], sent msg to [facility]

## 2016-11-28 ENCOUNTER — Encounter
Admission: RE | Admit: 2016-11-28 | Discharge: 2016-11-28 | Disposition: A | Discharge status: Home / Self Care | Payer: PPO | Source: Ambulatory Visit | Attending: Orthopedic Surgery | Admitting: Orthopedic Surgery

## 2016-11-28 ENCOUNTER — Other Ambulatory Visit: Payer: Self-pay

## 2016-11-28 DIAGNOSIS — R748 Abnormal levels of other serum enzymes: Secondary | ICD-10-CM | POA: Insufficient documentation

## 2016-11-28 DIAGNOSIS — I10 Essential (primary) hypertension: Secondary | ICD-10-CM | POA: Diagnosis not present

## 2016-11-28 DIAGNOSIS — R001 Bradycardia, unspecified: Secondary | ICD-10-CM | POA: Insufficient documentation

## 2016-11-28 DIAGNOSIS — M5134 Other intervertebral disc degeneration, thoracic region: Secondary | ICD-10-CM | POA: Insufficient documentation

## 2016-11-28 DIAGNOSIS — I444 Left anterior fascicular block: Secondary | ICD-10-CM | POA: Insufficient documentation

## 2016-11-28 DIAGNOSIS — Z01812 Encounter for preprocedural laboratory examination: Secondary | ICD-10-CM | POA: Diagnosis not present

## 2016-11-28 DIAGNOSIS — E785 Hyperlipidemia, unspecified: Secondary | ICD-10-CM | POA: Insufficient documentation

## 2016-11-28 DIAGNOSIS — Z01810 Encounter for preprocedural cardiovascular examination: Secondary | ICD-10-CM | POA: Insufficient documentation

## 2016-11-28 DIAGNOSIS — N400 Enlarged prostate without lower urinary tract symptoms: Secondary | ICD-10-CM | POA: Diagnosis not present

## 2016-11-28 HISTORY — DX: Unspecified osteoarthritis, unspecified site: M19.90

## 2016-11-28 LAB — CBC
HCT: 46.2 % (ref 40.0–52.0)
Hemoglobin: 15.2 g/dL (ref 13.0–18.0)
MCH: 30.2 pg (ref 26.0–34.0)
MCHC: 32.8 g/dL (ref 32.0–36.0)
MCV: 92 fL (ref 80.0–100.0)
PLATELETS: 158 10*3/uL (ref 150–440)
RBC: 5.02 MIL/uL (ref 4.40–5.90)
RDW: 13.7 % (ref 11.5–14.5)
WBC: 5.3 10*3/uL (ref 3.8–10.6)

## 2016-11-28 LAB — URINALYSIS, ROUTINE W REFLEX MICROSCOPIC
BACTERIA UA: NONE SEEN
Bilirubin Urine: NEGATIVE
Glucose, UA: NEGATIVE mg/dL
Hgb urine dipstick: NEGATIVE
Ketones, ur: NEGATIVE mg/dL
Nitrite: NEGATIVE
PH: 5 (ref 5.0–8.0)
Protein, ur: NEGATIVE mg/dL
Specific Gravity, Urine: 1.023 (ref 1.005–1.030)

## 2016-11-28 LAB — TYPE AND SCREEN
ABO/RH(D): A POS
ANTIBODY SCREEN: NEGATIVE

## 2016-11-28 LAB — SEDIMENTATION RATE: SED RATE: 6 mm/h (ref 0–20)

## 2016-11-28 LAB — COMPREHENSIVE METABOLIC PANEL
ALT: 27 U/L (ref 17–63)
AST: 43 U/L — ABNORMAL HIGH (ref 15–41)
Albumin: 3.9 g/dL (ref 3.5–5.0)
Alkaline Phosphatase: 58 U/L (ref 38–126)
Anion gap: 5 (ref 5–15)
BUN: 23 mg/dL — ABNORMAL HIGH (ref 6–20)
CHLORIDE: 105 mmol/L (ref 101–111)
CO2: 28 mmol/L (ref 22–32)
CREATININE: 0.88 mg/dL (ref 0.61–1.24)
Calcium: 8.9 mg/dL (ref 8.9–10.3)
GFR calc non Af Amer: >60 mL/min (ref >60)
Glucose, Bld: 95 mg/dL (ref 65–99)
POTASSIUM: 4.5 mmol/L (ref 3.5–5.1)
SODIUM: 138 mmol/L (ref 135–145)
Total Bilirubin: 0.8 mg/dL (ref 0.3–1.2)
Total Protein: 7.3 g/dL (ref 6.5–8.1)

## 2016-11-28 LAB — PROTIME-INR
INR: 0.95
Prothrombin Time: 12.6 seconds (ref 11.4–15.2)

## 2016-11-28 LAB — APTT: aPTT: 34 seconds (ref 24–36)

## 2016-11-28 LAB — C-REACTIVE PROTEIN

## 2016-11-28 LAB — SURGICAL PCR SCREEN
MRSA, PCR: NEGATIVE
Staphylococcus aureus: NEGATIVE

## 2016-11-28 NOTE — Patient Instructions (Signed)
Your procedure is scheduled on: 12/10/16 Mon Report to Same Day Surgery 2nd floor medical mall [facility] Entrance-take elevator on left to 2nd floor.  Check in with surgery information desk.) To find out your arrival time please call [phone] between 1PM - 3PM on 12/07/16 Fri  Remember: Instructions that are not followed completely may result in serious medical risk, up to and including death, or upon the discretion of your surgeon and anesthesiologist your surgery may need to be rescheduled.    _x___ 1. Do not eat food after midnight the night before your procedure. You may drink clear liquids up to 2 hours before you are scheduled to arrive at the hospital for your procedure.  Do not drink clear liquids within 2 hours of your scheduled arrival to the hospital.  Clear liquids include  --Water or Apple juice without pulp  --Clear carbohydrate beverage such as ClearFast or Gatorade  --Black Coffee or Clear Tea (No milk, no creamers, do not add anything to                  the coffee or Tea Type 1 and type 2 diabetics should only drink water.  No gum chewing or hard candies.     __x__ 2. No Alcohol for 24 hours before or after surgery.   __x__3. No Smoking for 24 prior to surgery.   ____  4. Bring all medications with you on the day of surgery if instructed.    __x__ 5. Notify your doctor if there is any change in your medical condition     (cold, fever, infections).     Do not wear jewelry, make-up, hairpins, clips or nail polish.  Do not wear lotions, powders, or perfumes. You may wear deodorant.  Do not shave 48 hours prior to surgery. Men may shave face and neck.  Do not bring valuables to the hospital.    [facility] is not responsible for any belongings or valuables.               Contacts, dentures or bridgework may not be worn into surgery.  Leave your suitcase in the car. After surgery it may be brought to your room.  For patients admitted to the hospital, discharge  time is determined by your                       treatment team.   Patients discharged the day of surgery will not be allowed to drive home.  You will need someone to drive you home and stay with you the night of your procedure.    Please read over the following fact sheets that you were given:   [facility] Preparing for Surgery and or MRSA Information   _x___ Take anti-hypertensive listed below, cardiac, seizure, asthma,     anti-reflux and psychiatric medicines. These include:  1. gabapentin (NEURONTIN) 800 MG tablet  2.Oxycodone HCl 10 MG TABS  3.  4.  5.  6.  ____Fleets enema or Magnesium Citrate as directed.   _x___ Use CHG Soap or sage wipes as directed on instruction sheet   ____ Use inhalers on the day of surgery and bring to hospital day of surgery  ____ Stop Metformin and Janumet 2 days prior to surgery.    ____ Take 1/2 of usual insulin dose the night before surgery and none on the morning     surgery.   _x___ Follow recommendations from Cardiologist, Pulmonologist or PCP regarding  stopping Aspirin, Coumadin, Plavix ,Eliquis, Effient, or Pradaxa, and Pletal.  X____Stop Anti-inflammatories such as Advil, Aleve, Ibuprofen, Motrin, Naproxen, Naprosyn, Goodies powders or aspirin products. OK to take Tylenol and                          Celebrex.   _x___ Stop supplements until after surgery.  But may continue Vitamin D, Vitamin B,       and multivitamin.  Stop Turmeric 1 week before surgery   ____ Bring C-Pap to the hospital.

## 2016-11-30 LAB — URINE CULTURE
CULTURE: NO GROWTH
Special Requests: NORMAL

## 2016-12-04 DIAGNOSIS — M25561 Pain in right knee: Secondary | ICD-10-CM | POA: Diagnosis not present

## 2016-12-05 ENCOUNTER — Encounter: Payer: Self-pay | Admitting: Anesthesiology

## 2016-12-05 ENCOUNTER — Other Ambulatory Visit: Payer: Self-pay

## 2016-12-05 ENCOUNTER — Ambulatory Visit: Payer: PPO | Attending: Anesthesiology | Admitting: Anesthesiology

## 2016-12-05 VITALS — BP 143/71 | HR 71 | Temp 97.7°F | Resp 16 | Ht 68.0 in | Wt 158.0 lb

## 2016-12-05 DIAGNOSIS — Z79899 Other long term (current) drug therapy: Secondary | ICD-10-CM | POA: Diagnosis not present

## 2016-12-05 DIAGNOSIS — G8929 Other chronic pain: Secondary | ICD-10-CM

## 2016-12-05 DIAGNOSIS — B0229 Other postherpetic nervous system involvement: Secondary | ICD-10-CM

## 2016-12-05 DIAGNOSIS — M546 Pain in thoracic spine: Secondary | ICD-10-CM

## 2016-12-05 DIAGNOSIS — M5134 Other intervertebral disc degeneration, thoracic region: Secondary | ICD-10-CM | POA: Diagnosis not present

## 2016-12-05 DIAGNOSIS — Z79891 Long term (current) use of opiate analgesic: Secondary | ICD-10-CM | POA: Insufficient documentation

## 2016-12-05 DIAGNOSIS — M545 Low back pain: Secondary | ICD-10-CM | POA: Diagnosis not present

## 2016-12-05 DIAGNOSIS — F1190 Opioid use, unspecified, uncomplicated: Secondary | ICD-10-CM | POA: Diagnosis not present

## 2016-12-05 MED ORDER — OXYCODONE HCL 10 MG PO TABS: 10.0000 mg | ORAL_TABLET | Freq: Three times a day (TID) | ORAL | 0 refills | Status: DC

## 2016-12-05 NOTE — Progress Notes (Signed)
Safety precautions to be maintained throughout the outpatient stay will include: orient to surroundings, keep bed in low position, maintain call bell within reach at all times, provide assistance with transfer out of bed and ambulation.  

## 2016-12-05 NOTE — Progress Notes (Signed)
Nursing Pain Medication Assessment:  Safety precautions to be maintained throughout the outpatient stay will include: orient to surroundings, keep bed in low position, maintain call bell within reach at all times, provide assistance with transfer out of bed and ambulation.  Medication Inspection Compliance: Pill count conducted under aseptic conditions, in front of the patient. Neither the pills nor the bottle was removed from the patient's sight at any time. Once count was completed pills were immediately returned to the patient in their original bottle.  Medication: Oxycodone IR Pill/Patch Count: 33 of 90 pills remain Pill/Patch Appearance: Markings consistent with prescribed medication Bottle Appearance: Standard pharmacy container. Clearly labeled. Filled Date: 29 / 08 / 2018 Last Medication intake:  Today

## 2016-12-07 NOTE — Progress Notes (Signed)
Subjective:  Patient ID: Timothy Knapp, male    DOB: 1940-01-17  Age: 76 y.o. MRN: [id number]  CC: Back Pain (low back)   Procedure: None  HPI Timothy Knapp presents for reevaluation.  He was last seen in October and states that he has been doing reasonably well.  His right side thoracic pain with postherpetic neuralgia and low back pain are stable in nature with no significant changes noted recently.  Otherwise he reports that he is in his usual state of health and tolerating his medications without difficulty.  He continues to take his oxycodone without trouble.  Based on his narcotic assessment sheet he continues to derive good functional lifestyle improvement with the medication with minimal side effects.  Otherwise the quality characteristic and distribution of his pain to been stable in nature.  He has been trying to do exercises to maintain core strength.  He is scheduled for an upcoming surgery and notified us of that today.  Outpatient Medications Prior to Visit  Medication Sig Dispense Refill  . amitriptyline (ELAVIL) 10 MG tablet Limit 1 - 3 tablets by mouth at bedtime if tolerated (Patient not taking: Reported on 10/17/2016) 90 tablet 0  . atorvastatin (LIPITOR) 40 MG tablet Take 1 tablet (40 mg total) by mouth at bedtime. (Patient taking differently: Take 20 mg by mouth at bedtime. ) 90 tablet 4  . b complex vitamins tablet Take 1 tablet by mouth daily.     . benazepril (LOTENSIN) 20 MG tablet Take 1 tablet (20 mg total) daily by mouth. 90 tablet 1  . diclofenac sodium (VOLTAREN) 1 % GEL Apply 1 g daily as needed topically (back of knees).     . gabapentin (NEURONTIN) 800 MG tablet Limited 1-2 tabs by mouth per day if tolerated (Patient taking differently: Take 400-800 mg 2 (two) times daily by mouth. Takes (0.5 tablet) 400 mg in the morning and (1 tablet) 800 mg in the evening) 60 tablet 2  . GLUCOSAMINE-CHONDROITIN PO Take 1 tablet 3 (three) times daily by mouth.    . LYSINE PO  Take 100 mg by mouth daily.    . naproxen sodium (ALEVE) 220 MG tablet Take 440 mg daily as needed by mouth (before physical activity).    . NON FORMULARY Take 5 drops 2 (two) times daily by mouth.     . Turmeric 500 MG CAPS Take 500 mg daily by mouth. Reported on 05/30/2015    . Oxycodone HCl 10 MG TABS Take 1 tablet (10 mg total) by mouth 3 (three) times daily. 90 tablet 0   No facility-administered medications prior to visit.     Review of Systems CNS: No confusion or sedation Cardiac: No angina or palpitations Pulmonary: No shortness of breath GI: No constipation or abdominal pain  Objective:  BP (!) 143/71   Pulse 71   Temp 97.7 F (36.5 C) (Oral)   Resp 16   Ht 5\' 8"  (1.727 m)   Wt 158 lb (71.7 kg)   SpO2 94%   BMI 24.02 kg/m    BP Readings from Last 3 Encounters:  12/05/16 (!) 143/71  11/28/16 122/64  10/17/16 131/70     Wt Readings from Last 3 Encounters:  12/05/16 158 lb (71.7 kg)  11/28/16 155 lb (70.3 kg)  10/17/16 155 lb (70.3 kg)     Physical Exam Pt is alert and oriented PERRL EOMI HEART IS RRR no murmur or rub LCTA no wheezing or rhales MUSCULOSKELETAL reveals a mildly  antalgic gait and his muscle tone and bulk to the lower extremities appears intact.  Appears to be ambulating reasonably well.  Labs  No results found for: HGBA1C Lab Results  Component Value Date   LDLCALC 102 (H) 12/05/2015   CREATININE 0.88 11/28/2016    -------------------------------------------------------------------------------------------------------------------- Lab Results  Component Value Date   WBC 5.3 11/28/2016   HGB 15.2 11/28/2016   HCT 46.2 11/28/2016   PLT 158 11/28/2016   GLUCOSE 95 11/28/2016   CHOL 161 05/28/2016   TRIG 125 05/28/2016   HDL 49 12/05/2015   LDLCALC 102 (H) 12/05/2015   ALT 27 11/28/2016   AST 43 (H) 11/28/2016   NA 138 11/28/2016   K 4.5 11/28/2016   CL 105 11/28/2016   CREATININE 0.88 11/28/2016   BUN 23 (H) 11/28/2016    CO2 28 11/28/2016   TSH 2.490 12/05/2015   INR 0.95 11/28/2016    --------------------------------------------------------------------------------------------------------------------- No results found.   Assessment & Plan:   Timothy Knapp was seen today for back pain.  Diagnoses and all orders for this visit:  Postherpetic neuralgia  Chronic right-sided thoracic back pain  DDD (degenerative disc disease), thoracic  Chronic, continuous use of opioids  Other orders -     Oxycodone HCl 10 MG TABS; Take 1 tablet (10 mg total) by mouth 3 (three) times daily.        ----------------------------------------------------------------------------------------------------------------------  Problem List Items Addressed This Visit      Unprioritized   DDD (degenerative disc disease), thoracic   Relevant Medications   Oxycodone HCl 10 MG TABS   Postherpetic neuralgia - Primary    Other Visit Diagnoses    Chronic right-sided thoracic back pain       Relevant Medications   Oxycodone HCl 10 MG TABS   Chronic, continuous use of opioids            ----------------------------------------------------------------------------------------------------------------------  1. Postherpetic neuralgia We will continue with his current medication regimen.  He is scheduled to return to clinic in 1 month for reevaluation.  2. Chronic right-sided thoracic back pain   3. DDD (degenerative disc disease), thoracic Continue with core strengthening exercises and aerobic activity  4. Chronic, continuous use of opioids We reviewed the [facility] practitioner database information and it is appropriate.  Refills were given for December 8 for oxycodone 10 mg tablets #90    ----------------------------------------------------------------------------------------------------------------------  I am having Timothy Knapp maintain his b complex vitamins, Turmeric, gabapentin, amitriptyline,  atorvastatin, diclofenac sodium, LYSINE PO, NON FORMULARY, benazepril, naproxen sodium, GLUCOSAMINE-CHONDROITIN PO, and Oxycodone HCl.   Meds ordered this encounter  Medications  . Oxycodone HCl 10 MG TABS    Sig: Take 1 tablet (10 mg total) by mouth 3 (three) times daily.    Dispense:  90 tablet    Refill:  0    Do not fill until [id number]     Medication List        Accurate as of 12/05/16 11:59 PM. Always use your most recent med list.          amitriptyline 10 MG tablet Commonly known as:  ELAVIL Limit 1 - 3 tablets by mouth at bedtime if tolerated   atorvastatin 40 MG tablet Commonly known as:  LIPITOR Take 1 tablet (40 mg total) by mouth at bedtime.   b complex vitamins tablet   benazepril 20 MG tablet Commonly known as:  LOTENSIN Take 1 tablet (20 mg total) daily by mouth.   diclofenac sodium 1 % Gel Commonly  known as:  VOLTAREN   gabapentin 800 MG tablet Commonly known as:  NEURONTIN Limited 1-2 tabs by mouth per day if tolerated   GLUCOSAMINE-CHONDROITIN PO   LYSINE PO   naproxen sodium 220 MG tablet Commonly known as:  ALEVE   NON FORMULARY   Oxycodone HCl 10 MG Tabs Take 1 tablet (10 mg total) by mouth 3 (three) times daily.   Turmeric 500 MG Caps       Where to Get Your Medications    You can get these medications from any pharmacy   Bring a paper prescription for each of these medications  Oxycodone HCl 10 MG Tabs    ----------------------------------------------------------------------------------------------------------------------  Follow-up: Return in about 1 month (around 01/04/2017) for evaluation, med refill.    [name], MD

## 2016-12-09 MED ORDER — TRANEXAMIC ACID 1000 MG/10ML IV SOLN
1000.0000 mg | INTRAVENOUS | Status: DC
  Filled 2016-12-09: qty 10

## 2016-12-09 MED ORDER — CEFAZOLIN SODIUM-DEXTROSE 2-4 GM/100ML-% IV SOLN: 2.0000 g | INTRAVENOUS | Status: DC

## 2016-12-09 NOTE — Discharge Instructions (Signed)
°  Instructions after Total Knee Replacement ° ° Mija Effertz P. Maddex Garlitz, Jr., M.D.    ° Dept. of Orthopaedics & Sports Medicine ° [facility] ° [address] ° , [place]  [address] ° Phone: [phone]   Fax: [phone] ° °  °DIET: °• Drink plenty of non-alcoholic fluids. °• Resume your normal diet. Include foods high in fiber. ° °ACTIVITY:  °• You may use crutches or a walker with weight-bearing as tolerated, unless instructed otherwise. °• You may be weaned off of the walker or crutches by your Physical Therapist.  °• Do NOT place pillows under the knee. Anything placed under the knee could limit your ability to straighten the knee.   °• Continue doing gentle exercises. Exercising will reduce the pain and swelling, increase motion, and prevent muscle weakness.   °• Please continue to use the TED compression stockings for 6 weeks. You may remove the stockings at night, but should reapply them in the morning. °• Do not drive or operate any equipment until instructed. ° °WOUND CARE:  °• Continue to use the PolarCare or ice packs periodically to reduce pain and swelling. °• You may bathe or shower after the staples are removed at the first office visit following surgery. ° °MEDICATIONS: °• You may resume your regular medications. °• Please take the pain medication as prescribed on the medication. °• Do not take pain medication on an empty stomach. °• You have been given a prescription for a blood thinner (Lovenox or Coumadin). Please take the medication as instructed. (NOTE: After completing a 2 week course of Lovenox, take one Enteric-coated aspirin once a day. This along with elevation will help reduce the possibility of phlebitis in your operated leg.) °• Do not drive or drink alcoholic beverages when taking pain medications. ° °CALL THE OFFICE FOR: °• Temperature above 101 degrees °• Excessive bleeding or drainage on the dressing. °• Excessive swelling, coldness, or paleness of the toes. °• Persistent  nausea and vomiting. ° °FOLLOW-UP:  °• You should have an appointment to return to the office in 10-14 days after surgery. °• Arrangements have been made for continuation of Physical Therapy (either home therapy or outpatient therapy). °  °

## 2016-12-10 ENCOUNTER — Inpatient Hospital Stay
Admission: RE | Admit: 2016-12-10 | Discharge: 2016-12-12 | DRG: 470 | Disposition: A | Discharge status: Home Health | Payer: PPO | Source: Ambulatory Visit | Attending: Orthopedic Surgery | Admitting: Orthopedic Surgery

## 2016-12-10 ENCOUNTER — Inpatient Hospital Stay: Payer: PPO | Admitting: Anesthesiology

## 2016-12-10 ENCOUNTER — Encounter: Payer: Self-pay | Admitting: Family Medicine

## 2016-12-10 ENCOUNTER — Encounter: Payer: Self-pay | Admitting: Orthopedic Surgery

## 2016-12-10 ENCOUNTER — Other Ambulatory Visit: Payer: Self-pay

## 2016-12-10 ENCOUNTER — Encounter
Admission: RE | Disposition: A | Discharge status: Home Health | Payer: Self-pay | Source: Ambulatory Visit | Attending: Orthopedic Surgery

## 2016-12-10 ENCOUNTER — Inpatient Hospital Stay: Payer: PPO

## 2016-12-10 DIAGNOSIS — I10 Essential (primary) hypertension: Secondary | ICD-10-CM | POA: Diagnosis present

## 2016-12-10 DIAGNOSIS — Z96651 Presence of right artificial knee joint: Secondary | ICD-10-CM | POA: Diagnosis not present

## 2016-12-10 DIAGNOSIS — Z7982 Long term (current) use of aspirin: Secondary | ICD-10-CM | POA: Diagnosis not present

## 2016-12-10 DIAGNOSIS — E785 Hyperlipidemia, unspecified: Secondary | ICD-10-CM | POA: Diagnosis present

## 2016-12-10 DIAGNOSIS — M1711 Unilateral primary osteoarthritis, right knee: Secondary | ICD-10-CM | POA: Diagnosis not present

## 2016-12-10 DIAGNOSIS — N400 Enlarged prostate without lower urinary tract symptoms: Secondary | ICD-10-CM | POA: Diagnosis present

## 2016-12-10 DIAGNOSIS — B0229 Other postherpetic nervous system involvement: Secondary | ICD-10-CM | POA: Diagnosis present

## 2016-12-10 DIAGNOSIS — Z8249 Family history of ischemic heart disease and other diseases of the circulatory system: Secondary | ICD-10-CM | POA: Diagnosis not present

## 2016-12-10 DIAGNOSIS — Z79891 Long term (current) use of opiate analgesic: Secondary | ICD-10-CM | POA: Diagnosis not present

## 2016-12-10 DIAGNOSIS — M25561 Pain in right knee: Secondary | ICD-10-CM | POA: Diagnosis present

## 2016-12-10 DIAGNOSIS — Z471 Aftercare following joint replacement surgery: Secondary | ICD-10-CM | POA: Diagnosis not present

## 2016-12-10 DIAGNOSIS — Z96659 Presence of unspecified artificial knee joint: Secondary | ICD-10-CM

## 2016-12-10 HISTORY — PX: KNEE ARTHROPLASTY: SHX992

## 2016-12-10 LAB — ABO/RH: ABO/RH(D): A POS

## 2016-12-10 SURGERY — ARTHROPLASTY, KNEE, TOTAL, USING IMAGELESS COMPUTER-ASSISTED NAVIGATION
Anesthesia: Spinal | Site: Knee | Laterality: Right | Wound class: Clean

## 2016-12-10 MED ORDER — FAMOTIDINE 20 MG PO TABS
ORAL_TABLET | ORAL | Status: AC
  Filled 2016-12-10: qty 1

## 2016-12-10 MED ORDER — ENOXAPARIN SODIUM 30 MG/0.3ML ~~LOC~~ SOLN
30.0000 mg | Freq: Two times a day (BID) | SUBCUTANEOUS | Status: DC
  Administered 2016-12-11 – 2016-12-12 (×3): 30 mg via SUBCUTANEOUS
  Filled 2016-12-10 (×3): qty 0.3

## 2016-12-10 MED ORDER — ONDANSETRON HCL 4 MG PO TABS: 4.0000 mg | ORAL_TABLET | Freq: Four times a day (QID) | ORAL | Status: DC | PRN

## 2016-12-10 MED ORDER — ALUM & MAG HYDROXIDE-SIMETH 200-200-20 MG/5ML PO SUSP: 30.0000 mL | ORAL | Status: DC | PRN

## 2016-12-10 MED ORDER — METOCLOPRAMIDE HCL 10 MG PO TABS
10.0000 mg | ORAL_TABLET | Freq: Three times a day (TID) | ORAL | Status: DC
  Administered 2016-12-10 – 2016-12-12 (×7): 10 mg via ORAL
  Filled 2016-12-10 (×7): qty 1

## 2016-12-10 MED ORDER — GLYCOPYRROLATE 0.2 MG/ML IJ SOLN
INTRAMUSCULAR | Status: DC | PRN
  Administered 2016-12-10: 0.2 mg via INTRAVENOUS

## 2016-12-10 MED ORDER — FAMOTIDINE 20 MG PO TABS
20.0000 mg | ORAL_TABLET | Freq: Once | ORAL | Status: AC
  Administered 2016-12-10: 20 mg via ORAL

## 2016-12-10 MED ORDER — NEOMYCIN-POLYMYXIN B GU 40-200000 IR SOLN
Status: AC
  Filled 2016-12-10: qty 20

## 2016-12-10 MED ORDER — SODIUM CHLORIDE 0.9 % IV SOLN
INTRAVENOUS | Status: DC | PRN
  Administered 2016-12-10: 30 ug/min via INTRAVENOUS

## 2016-12-10 MED ORDER — OXYCODONE HCL 5 MG PO TABS
10.0000 mg | ORAL_TABLET | ORAL | Status: DC | PRN
  Administered 2016-12-10 – 2016-12-11 (×2): 10 mg via ORAL
  Filled 2016-12-10 (×2): qty 2

## 2016-12-10 MED ORDER — MAGNESIUM HYDROXIDE 400 MG/5ML PO SUSP
30.0000 mL | Freq: Every day | ORAL | Status: DC | PRN
  Administered 2016-12-11: 30 mL via ORAL
  Filled 2016-12-10 (×2): qty 30

## 2016-12-10 MED ORDER — LACTATED RINGERS IV SOLN
INTRAVENOUS | Status: DC
  Administered 2016-12-10 (×2): via INTRAVENOUS

## 2016-12-10 MED ORDER — GABAPENTIN 400 MG PO CAPS
800.0000 mg | ORAL_CAPSULE | Freq: Every day | ORAL | Status: DC
  Administered 2016-12-10 – 2016-12-11 (×2): 800 mg via ORAL
  Filled 2016-12-10 (×2): qty 2

## 2016-12-10 MED ORDER — SENNOSIDES-DOCUSATE SODIUM 8.6-50 MG PO TABS
1.0000 | ORAL_TABLET | Freq: Two times a day (BID) | ORAL | Status: DC
  Administered 2016-12-10 – 2016-12-12 (×4): 1 via ORAL
  Filled 2016-12-10 (×4): qty 1

## 2016-12-10 MED ORDER — NEOMYCIN-POLYMYXIN B GU 40-200000 IR SOLN
Status: DC | PRN
  Administered 2016-12-10: 16 mL

## 2016-12-10 MED ORDER — PROPOFOL 500 MG/50ML IV EMUL
INTRAVENOUS | Status: DC | PRN
  Administered 2016-12-10: 50 ug/kg/min via INTRAVENOUS

## 2016-12-10 MED ORDER — BISACODYL 10 MG RE SUPP: 10.0000 mg | Freq: Every day | RECTAL | Status: DC | PRN

## 2016-12-10 MED ORDER — BUPIVACAINE HCL (PF) 0.25 % IJ SOLN
INTRAMUSCULAR | Status: DC | PRN
  Administered 2016-12-10: 60 mL

## 2016-12-10 MED ORDER — ACETAMINOPHEN 10 MG/ML IV SOLN
1000.0000 mg | Freq: Four times a day (QID) | INTRAVENOUS | Status: AC
  Administered 2016-12-10 – 2016-12-11 (×3): 1000 mg via INTRAVENOUS
  Filled 2016-12-10 (×4): qty 100

## 2016-12-10 MED ORDER — ACETAMINOPHEN 650 MG RE SUPP: 650.0000 mg | RECTAL | Status: DC | PRN

## 2016-12-10 MED ORDER — EPHEDRINE SULFATE 50 MG/ML IJ SOLN
INTRAMUSCULAR | Status: AC
Start: 2016-12-10 — End: 2016-12-10
  Filled 2016-12-10: qty 1

## 2016-12-10 MED ORDER — OXYCODONE HCL 5 MG PO TABS
5.0000 mg | ORAL_TABLET | ORAL | Status: DC | PRN
  Administered 2016-12-10 – 2016-12-11 (×4): 5 mg via ORAL
  Filled 2016-12-10 (×4): qty 1

## 2016-12-10 MED ORDER — LIDOCAINE HCL (CARDIAC) 20 MG/ML IV SOLN
INTRAVENOUS | Status: DC | PRN
  Administered 2016-12-10 (×2): 50 mg via INTRAVENOUS

## 2016-12-10 MED ORDER — LIDOCAINE HCL (PF) 2 % IJ SOLN
INTRAMUSCULAR | Status: AC
  Filled 2016-12-10: qty 10

## 2016-12-10 MED ORDER — SODIUM CHLORIDE 0.9 % IV SOLN
INTRAVENOUS | Status: DC
  Administered 2016-12-10 – 2016-12-11 (×2): via INTRAVENOUS

## 2016-12-10 MED ORDER — ACETAMINOPHEN 325 MG PO TABS: 650.0000 mg | ORAL_TABLET | ORAL | Status: DC | PRN

## 2016-12-10 MED ORDER — DIPHENHYDRAMINE HCL 12.5 MG/5ML PO ELIX
12.5000 mg | ORAL_SOLUTION | ORAL | Status: DC | PRN
  Administered 2016-12-11: 25 mg via ORAL
  Filled 2016-12-10: qty 10

## 2016-12-10 MED ORDER — TRANEXAMIC ACID 1000 MG/10ML IV SOLN
1000.0000 mg | Freq: Once | INTRAVENOUS | Status: AC
  Administered 2016-12-10: 1000 mg via INTRAVENOUS
  Filled 2016-12-10: qty 10

## 2016-12-10 MED ORDER — BENAZEPRIL HCL 20 MG PO TABS
20.0000 mg | ORAL_TABLET | Freq: Every day | ORAL | Status: DC
  Administered 2016-12-10 – 2016-12-12 (×3): 20 mg via ORAL
  Filled 2016-12-10 (×3): qty 1

## 2016-12-10 MED ORDER — ONDANSETRON HCL 4 MG/2ML IJ SOLN: 4.0000 mg | Freq: Four times a day (QID) | INTRAMUSCULAR | Status: DC | PRN

## 2016-12-10 MED ORDER — GLYCOPYRROLATE 0.2 MG/ML IJ SOLN
INTRAMUSCULAR | Status: AC
  Filled 2016-12-10: qty 1

## 2016-12-10 MED ORDER — PROPOFOL 500 MG/50ML IV EMUL
INTRAVENOUS | Status: AC
  Filled 2016-12-10: qty 50

## 2016-12-10 MED ORDER — GABAPENTIN 400 MG PO CAPS
400.0000 mg | ORAL_CAPSULE | Freq: Every morning | ORAL | Status: DC
  Administered 2016-12-11 – 2016-12-12 (×2): 400 mg via ORAL
  Filled 2016-12-10 (×2): qty 1

## 2016-12-10 MED ORDER — PANTOPRAZOLE SODIUM 40 MG PO TBEC
40.0000 mg | DELAYED_RELEASE_TABLET | Freq: Two times a day (BID) | ORAL | Status: DC
  Administered 2016-12-10 – 2016-12-12 (×4): 40 mg via ORAL
  Filled 2016-12-10 (×4): qty 1

## 2016-12-10 MED ORDER — MIDAZOLAM HCL 2 MG/2ML IJ SOLN
INTRAMUSCULAR | Status: AC
  Filled 2016-12-10: qty 2

## 2016-12-10 MED ORDER — CEFAZOLIN SODIUM-DEXTROSE 2-3 GM-%(50ML) IV SOLR
INTRAVENOUS | Status: DC | PRN
  Administered 2016-12-10: 2 g via INTRAVENOUS

## 2016-12-10 MED ORDER — FENTANYL CITRATE (PF) 100 MCG/2ML IJ SOLN
INTRAMUSCULAR | Status: DC | PRN
  Administered 2016-12-10: 100 ug via INTRAVENOUS

## 2016-12-10 MED ORDER — EPHEDRINE SULFATE 50 MG/ML IJ SOLN
INTRAMUSCULAR | Status: DC | PRN
  Administered 2016-12-10: 10 mg via INTRAVENOUS

## 2016-12-10 MED ORDER — CELECOXIB 200 MG PO CAPS
200.0000 mg | ORAL_CAPSULE | Freq: Two times a day (BID) | ORAL | Status: DC
  Administered 2016-12-10 – 2016-12-12 (×4): 200 mg via ORAL
  Filled 2016-12-10 (×4): qty 1

## 2016-12-10 MED ORDER — ONDANSETRON HCL 4 MG/2ML IJ SOLN: 4.0000 mg | Freq: Once | INTRAMUSCULAR | Status: DC | PRN

## 2016-12-10 MED ORDER — CEFAZOLIN SODIUM 10 G IJ SOLR
2.0000 g | Freq: Four times a day (QID) | INTRAMUSCULAR | Status: AC
  Administered 2016-12-10 – 2016-12-11 (×4): 2 g via INTRAVENOUS
  Filled 2016-12-10 (×4): qty 2000

## 2016-12-10 MED ORDER — TRAMADOL HCL 50 MG PO TABS
50.0000 mg | ORAL_TABLET | ORAL | Status: DC | PRN
  Administered 2016-12-10 – 2016-12-11 (×4): 100 mg via ORAL
  Filled 2016-12-10 (×4): qty 2

## 2016-12-10 MED ORDER — FERROUS SULFATE 325 (65 FE) MG PO TABS
325.0000 mg | ORAL_TABLET | Freq: Two times a day (BID) | ORAL | Status: DC
  Administered 2016-12-10 – 2016-12-12 (×4): 325 mg via ORAL
  Filled 2016-12-10 (×4): qty 1

## 2016-12-10 MED ORDER — TRANEXAMIC ACID 1000 MG/10ML IV SOLN
INTRAVENOUS | Status: DC | PRN
  Administered 2016-12-10: 1000 mg via INTRAVENOUS

## 2016-12-10 MED ORDER — RENA-VITE PO TABS
1.0000 | ORAL_TABLET | Freq: Every day | ORAL | Status: DC
  Administered 2016-12-11 – 2016-12-12 (×2): 1 via ORAL
  Filled 2016-12-10 (×2): qty 1

## 2016-12-10 MED ORDER — BUPIVACAINE HCL (PF) 0.5 % IJ SOLN
INTRAMUSCULAR | Status: AC
  Filled 2016-12-10: qty 10

## 2016-12-10 MED ORDER — ATORVASTATIN CALCIUM 20 MG PO TABS
40.0000 mg | ORAL_TABLET | Freq: Every day | ORAL | Status: DC
  Administered 2016-12-10 – 2016-12-11 (×2): 40 mg via ORAL
  Filled 2016-12-10 (×2): qty 2

## 2016-12-10 MED ORDER — MIDAZOLAM HCL 5 MG/5ML IJ SOLN
INTRAMUSCULAR | Status: DC | PRN
  Administered 2016-12-10: 0.5 mg via INTRAVENOUS

## 2016-12-10 MED ORDER — SODIUM CHLORIDE 0.9 % IV SOLN
INTRAVENOUS | Status: DC | PRN
  Administered 2016-12-10: 60 mL

## 2016-12-10 MED ORDER — BUPIVACAINE LIPOSOME 1.3 % IJ SUSP
INTRAMUSCULAR | Status: AC
  Filled 2016-12-10: qty 20

## 2016-12-10 MED ORDER — MENTHOL 3 MG MT LOZG
1.0000 | LOZENGE | OROMUCOSAL | Status: DC | PRN
  Filled 2016-12-10: qty 9

## 2016-12-10 MED ORDER — CEFAZOLIN SODIUM-DEXTROSE 2-4 GM/100ML-% IV SOLN
INTRAVENOUS | Status: AC
  Filled 2016-12-10: qty 100

## 2016-12-10 MED ORDER — ACETAMINOPHEN 10 MG/ML IV SOLN
INTRAVENOUS | Status: AC
  Filled 2016-12-10: qty 100

## 2016-12-10 MED ORDER — FENTANYL CITRATE (PF) 100 MCG/2ML IJ SOLN: 25.0000 ug | INTRAMUSCULAR | Status: DC | PRN

## 2016-12-10 MED ORDER — DEXAMETHASONE SODIUM PHOSPHATE 4 MG/ML IJ SOLN
INTRAMUSCULAR | Status: DC | PRN
  Administered 2016-12-10: 5 mg via INTRAVENOUS

## 2016-12-10 MED ORDER — ACETAMINOPHEN 10 MG/ML IV SOLN
INTRAVENOUS | Status: DC | PRN
  Administered 2016-12-10: 1000 mg via INTRAVENOUS

## 2016-12-10 MED ORDER — DEXAMETHASONE SODIUM PHOSPHATE 10 MG/ML IJ SOLN
INTRAMUSCULAR | Status: AC
  Filled 2016-12-10: qty 1

## 2016-12-10 MED ORDER — SODIUM CHLORIDE 0.9 % IJ SOLN
INTRAMUSCULAR | Status: AC
  Filled 2016-12-10: qty 50

## 2016-12-10 MED ORDER — MORPHINE SULFATE (PF) 2 MG/ML IV SOLN: 2.0000 mg | INTRAVENOUS | Status: DC | PRN

## 2016-12-10 MED ORDER — PHENOL 1.4 % MT LIQD
1.0000 | OROMUCOSAL | Status: DC | PRN
  Filled 2016-12-10: qty 177

## 2016-12-10 MED ORDER — BUPIVACAINE HCL (PF) 0.25 % IJ SOLN
INTRAMUSCULAR | Status: AC
  Filled 2016-12-10: qty 60

## 2016-12-10 MED ORDER — CHLORHEXIDINE GLUCONATE 4 % EX LIQD: 60.0000 mL | Freq: Once | CUTANEOUS | Status: DC

## 2016-12-10 MED ORDER — BUPIVACAINE HCL (PF) 0.5 % IJ SOLN
INTRAMUSCULAR | Status: DC | PRN
  Administered 2016-12-10: 3 mL

## 2016-12-10 MED ORDER — FLEET ENEMA 7-19 GM/118ML RE ENEM: 1.0000 | ENEMA | Freq: Once | RECTAL | Status: DC | PRN

## 2016-12-10 MED ORDER — FENTANYL CITRATE (PF) 100 MCG/2ML IJ SOLN
INTRAMUSCULAR | Status: AC
  Filled 2016-12-10: qty 2

## 2016-12-10 SURGICAL SUPPLY — 65 items
BATTERY INSTRU NAVIGATION (MISCELLANEOUS) ×12 IMPLANT
BLADE SAW 1 (BLADE) ×3 IMPLANT
BLADE SAW 1/2 (BLADE) ×3 IMPLANT
BLADE SAW 70X12.5 (BLADE) IMPLANT
BTRY SRG DRVR LF (MISCELLANEOUS) ×4
CANISTER SUCT 1200ML W/VALVE (MISCELLANEOUS) ×3 IMPLANT
CANISTER SUCT 3000ML PPV (MISCELLANEOUS) ×6 IMPLANT
CAPT KNEE TOTAL 3 ATTUNE ×2 IMPLANT
CEMENT HV SMART SET (Cement) ×6 IMPLANT
COOLER POLAR GLACIER W/PUMP (MISCELLANEOUS) ×3 IMPLANT
CUFF TOURN 24 STER (MISCELLANEOUS) IMPLANT
CUFF TOURN 30 STER DUAL PORT (MISCELLANEOUS) IMPLANT
DRAPE SHEET LG 3/4 BI-LAMINATE (DRAPES) ×3 IMPLANT
DRSG DERMACEA 8X12 NADH (GAUZE/BANDAGES/DRESSINGS) ×3 IMPLANT
DRSG OPSITE POSTOP 4X14 (GAUZE/BANDAGES/DRESSINGS) ×3 IMPLANT
DRSG TEGADERM 4X4.75 (GAUZE/BANDAGES/DRESSINGS) ×3 IMPLANT
DURAPREP 26ML APPLICATOR (WOUND CARE) ×6 IMPLANT
ELECT CAUTERY BLADE 6.4 (BLADE) ×3 IMPLANT
ELECT REM PT RETURN 9FT ADLT (ELECTROSURGICAL) ×3
ELECTRODE REM PT RTRN 9FT ADLT (ELECTROSURGICAL) ×1 IMPLANT
EVACUATOR 1/8 PVC DRAIN (DRAIN) ×3 IMPLANT
EX-PIN ORTHOLOCK NAV 4X150 (PIN) ×6 IMPLANT
GLOVE BIOGEL M STRL SZ7.5 (GLOVE) ×6 IMPLANT
GLOVE BIOGEL PI IND STRL 9 (GLOVE) ×1 IMPLANT
GLOVE BIOGEL PI INDICATOR 9 (GLOVE) ×2
GLOVE INDICATOR 8.0 STRL GRN (GLOVE) ×3 IMPLANT
GLOVE SURG SYN 9.0  PF PI (GLOVE) ×2
GLOVE SURG SYN 9.0 PF PI (GLOVE) ×1 IMPLANT
GOWN STRL REUS W/ TWL LRG LVL3 (GOWN DISPOSABLE) ×2 IMPLANT
GOWN STRL REUS W/TWL 2XL LVL3 (GOWN DISPOSABLE) ×3 IMPLANT
GOWN STRL REUS W/TWL LRG LVL3 (GOWN DISPOSABLE) ×6
HOLDER FOLEY CATH W/STRAP (MISCELLANEOUS) ×3 IMPLANT
HOOD PEEL AWAY FLYTE STAYCOOL (MISCELLANEOUS) ×6 IMPLANT
KIT RM TURNOVER STRD PROC AR (KITS) ×3 IMPLANT
KNIFE SCULPS 14X20 (INSTRUMENTS) ×3 IMPLANT
LABEL OR SOLS (LABEL) ×3 IMPLANT
NDL SAFETY ECLIPSE 18X1.5 (NEEDLE) ×1 IMPLANT
NDL SPNL 20GX3.5 QUINCKE YW (NEEDLE) ×2 IMPLANT
NEEDLE HYPO 18GX1.5 SHARP (NEEDLE) ×3
NEEDLE SPNL 20GX3.5 QUINCKE YW (NEEDLE) ×6 IMPLANT
NS IRRIG 500ML POUR BTL (IV SOLUTION) ×3 IMPLANT
PACK TOTAL KNEE (MISCELLANEOUS) ×3 IMPLANT
PAD WRAPON POLAR KNEE (MISCELLANEOUS) ×1 IMPLANT
PIN FIXATION 1/8DIA X 3INL (PIN) ×3 IMPLANT
PULSAVAC PLUS IRRIG FAN TIP (DISPOSABLE) ×3
SOL .9 NS 3000ML IRR  AL (IV SOLUTION) ×2
SOL .9 NS 3000ML IRR AL (IV SOLUTION) ×1
SOL .9 NS 3000ML IRR UROMATIC (IV SOLUTION) ×1 IMPLANT
SOL PREP PVP 2OZ (MISCELLANEOUS) ×3
SOLUTION PREP PVP 2OZ (MISCELLANEOUS) ×1 IMPLANT
SPONGE DRAIN TRACH 4X4 STRL 2S (GAUZE/BANDAGES/DRESSINGS) ×3 IMPLANT
STAPLER SKIN PROX 35W (STAPLE) ×3 IMPLANT
STRAP TIBIA SHORT (MISCELLANEOUS) ×3 IMPLANT
SUCTION FRAZIER HANDLE 10FR (MISCELLANEOUS) ×2
SUCTION TUBE FRAZIER 10FR DISP (MISCELLANEOUS) ×1 IMPLANT
SUT VIC AB 0 CT1 36 (SUTURE) ×3 IMPLANT
SUT VIC AB 1 CT1 36 (SUTURE) ×6 IMPLANT
SUT VIC AB 2-0 CT2 27 (SUTURE) ×3 IMPLANT
SYR 20CC LL (SYRINGE) ×3 IMPLANT
SYR 30ML LL (SYRINGE) ×6 IMPLANT
TIP FAN IRRIG PULSAVAC PLUS (DISPOSABLE) ×1 IMPLANT
TOWEL OR 17X26 4PK STRL BLUE (TOWEL DISPOSABLE) ×3 IMPLANT
TOWER CARTRIDGE SMART MIX (DISPOSABLE) ×3 IMPLANT
TRAY FOLEY W/METER SILVER 16FR (SET/KITS/TRAYS/PACK) ×3 IMPLANT
WRAPON POLAR PAD KNEE (MISCELLANEOUS) ×3

## 2016-12-10 NOTE — Anesthesia Post-op Follow-up Note (Signed)
Anesthesia QCDR form completed.        

## 2016-12-10 NOTE — Op Note (Signed)
OPERATIVE NOTE  DATE OF SURGERY:  12/10/2016  PATIENT NAME:  Timothy Knapp   DOB: 10/03/1940  MRN: [id number]  PRE-OPERATIVE DIAGNOSIS: Degenerative arthrosis of the right knee, primary  POST-OPERATIVE DIAGNOSIS:  Same  PROCEDURE:  Right total knee arthroplasty using computer-assisted navigation  SURGEON:  Marciano Sequin. M.D.  ASSISTANT:  Vance Peper, PA (present and scrubbed throughout the case, critical for assistance with exposure, retraction, instrumentation, and closure)  ANESTHESIA: spinal  ESTIMATED BLOOD LOSS: 50 mL  FLUIDS REPLACED: 1500 mL of crystalloid  TOURNIQUET TIME: 80 minutes  DRAINS: 2 medium Hemovac drains  SOFT TISSUE RELEASES: Anterior cruciate ligament, posterior cruciate ligament, deep medial collateral ligament, patellofemoral ligament  IMPLANTS UTILIZED: DePuy Attune size 5 posterior stabilized femoral component (cemented), size 6 rotating platform tibial component (cemented), 38 mm medialized dome patella (cemented), and a 6 mm stabilized rotating platform polyethylene insert.  INDICATIONS FOR SURGERY: Timothy Knapp is a 76 y.o. year old male with a long history of progressive knee pain. X-rays demonstrated severe degenerative changes in tricompartmental fashion. The patient had not seen any significant improvement despite conservative nonsurgical intervention. After discussion of the risks and benefits of surgical intervention, the patient expressed understanding of the risks benefits and agree with plans for total knee arthroplasty.   The risks, benefits, and alternatives were discussed at length including but not limited to the risks of infection, bleeding, nerve injury, stiffness, blood clots, the need for revision surgery, cardiopulmonary complications, among others, and they were willing to proceed.  PROCEDURE IN DETAIL: The patient was brought into the operating room and, after adequate spinal anesthesia was achieved, a tourniquet was placed on  the patient's upper thigh. The patient's knee and leg were cleaned and prepped with alcohol and DuraPrep and draped in the usual sterile fashion. A "timeout" was performed as per usual protocol. The lower extremity was exsanguinated using an Esmarch, and the tourniquet was inflated to 300 mmHg. An anterior longitudinal incision was made followed by a standard mid vastus approach. The deep fibers of the medial collateral ligament were elevated in a subperiosteal fashion off of the medial flare of the tibia so as to maintain a continuous soft tissue sleeve. The patella was subluxed laterally and the patellofemoral ligament was incised. Inspection of the knee demonstrated severe degenerative changes with full-thickness loss of articular cartilage. Osteophytes were debrided using a rongeur. Anterior and posterior cruciate ligaments were excised. Two 4.0 mm Schanz pins were inserted in the femur and into the tibia for attachment of the array of trackers used for computer-assisted navigation. Hip center was identified using a circumduction technique. Distal landmarks were mapped using the computer. The distal femur and proximal tibia were mapped using the computer. The distal femoral cutting guide was positioned using computer-assisted navigation so as to achieve a 5 distal valgus cut. The femur was sized and it was felt that a size 5 femoral component was appropriate. A size 5 femoral cutting guide was positioned and the anterior cut was performed and verified using the computer. This was followed by completion of the posterior and chamfer cuts. Femoral cutting guide for the central box was then positioned in the center box cut was performed.  Attention was then directed to the proximal tibia. Medial and lateral menisci were excised. The extramedullary tibial cutting guide was positioned using computer-assisted navigation so as to achieve a 0 varus-valgus alignment and 3 posterior slope. The cut was performed and  verified using the computer. The proximal tibia  was sized and it was felt that a size 6 tibial tray was appropriate. Tibial and femoral trials were inserted followed by insertion of a 6 mm polyethylene insert. This allowed for excellent mediolateral soft tissue balancing both in flexion and in full extension. Finally, the patella was cut and prepared so as to accommodate a 38 mm medialized dome patella. A patella trial was placed and the knee was placed through a range of motion with excellent patellar tracking appreciated. The femoral trial was removed after debridement of posterior osteophytes. The central post-hole for the tibial component was reamed followed by insertion of a keel punch. Tibial trials were then removed. Cut surfaces of bone were irrigated with copious amounts of normal saline with antibiotic solution using pulsatile lavage and then suctioned dry. Polymethylmethacrylate cement was prepared in the usual fashion using a vacuum mixer. Cement was applied to the cut surface of the proximal tibia as well as along the undersurface of a size 6 rotating platform tibial component. Tibial component was positioned and impacted into place. Excess cement was removed using [employer] fast streamer. Cement was then applied to the cut surfaces of the femur as well as along the posterior flanges of the size 5 femoral component. The femoral component was positioned and impacted into place. Excess cement was removed using [employer] fast streamer. A 6 mm polyethylene trial was inserted and the knee was brought into full extension with steady axial compression applied. Finally, cement was applied to the backside of a 38 mm medialized dome patella and the patellar component was positioned and patellar clamp applied. Excess cement was removed using [employer] fast streamer. After adequate curing of the cement, the tourniquet was deflated after a total tourniquet time of 80 minutes. Hemostasis was achieved using electrocautery. The knee was  irrigated with copious amounts of normal saline with antibiotic solution using pulsatile lavage and then suctioned dry. 20 mL of 1.3% Exparel and 60 mL of 0.25% Marcaine in 40 mL of normal saline was injected along the posterior capsule, medial and lateral gutters, and along the arthrotomy site. A 6 mm stabilized rotating platform polyethylene insert was inserted and the knee was placed through a range of motion with excellent mediolateral soft tissue balancing appreciated and excellent patellar tracking noted. 2 medium drains were placed in the wound bed and brought out through separate stab incisions. The medial parapatellar portion of the incision was reapproximated using interrupted sutures of #1 Vicryl. Subcutaneous tissue was approximated in layers using first #0 Vicryl followed #2-0 Vicryl. The skin was approximated with skin staples. A sterile dressing was applied.  The patient tolerated the procedure well and was transported to the recovery room in stable condition.    James P. Holley Bouche., M.D.

## 2016-12-10 NOTE — Anesthesia Preprocedure Evaluation (Addendum)
Anesthesia Evaluation  Patient identified by MRN, date of birth, ID band Patient awake    Reviewed: Allergy & Precautions, NPO status , Patient's Chart, lab work & pertinent test results, reviewed documented beta blocker date and time   Airway Mallampati: II  TM Distance: >3 FB     Dental  (+) Chipped   Pulmonary           Cardiovascular hypertension, Pt. on medications      Neuro/Psych  Neuromuscular disease    GI/Hepatic   Endo/Other    Renal/GU      Musculoskeletal  (+) Arthritis ,   Abdominal   Peds  Hematology   Anesthesia Other Findings LAHB and LAD seen on EKG. Otherwise ok.  Reproductive/Obstetrics                            Anesthesia Physical Anesthesia Plan  ASA: III  Anesthesia Plan: Spinal   Post-op Pain Management:    Induction:   PONV Risk Score and Plan:   Airway Management Planned:   Additional Equipment:   Intra-op Plan:   Post-operative Plan:   Informed Consent: I have reviewed the patients History and Physical, chart, labs and discussed the procedure including the risks, benefits and alternatives for the proposed anesthesia with the patient or authorized representative who has indicated his/her understanding and acceptance.     Plan Discussed with: CRNA  Anesthesia Plan Comments:         Anesthesia Quick Evaluation

## 2016-12-10 NOTE — H&P (Signed)
The patient has been re-examined, and the chart reviewed, and there have been no interval changes to the documented history and physical.    The risks, benefits, and alternatives have been discussed at length. The patient expressed understanding of the risks benefits and agreed with plans for surgical intervention.  Karo Rog P. Mylo Choi, Jr. M.D.    

## 2016-12-10 NOTE — Anesthesia Procedure Notes (Signed)
Spinal  Patient location during procedure: OR Start time: 12/10/2016 11:28 AM End time: 12/10/2016 11:32 AM Staffing Performed: resident/CRNA  Preanesthetic Checklist Completed: patient identified, site marked, surgical consent, pre-op evaluation, timeout performed, IV checked, risks and benefits discussed and monitors and equipment checked Spinal Block Patient position: sitting Prep: ChloraPrep Patient monitoring: heart rate, continuous pulse ox, blood pressure and cardiac monitor Approach: right paramedian Location: L4-5 Injection technique: single-shot Needle Needle type: Introducer and Pencan  Needle gauge: 24 G Needle length: 9 cm Additional Notes Negative paresthesia. Negative blood return. Positive free-flowing CSF. Expiration date of kit checked and confirmed. Patient tolerated procedure well, without complications.

## 2016-12-10 NOTE — NC FL2 (Signed)
[name] LEVEL OF CARE SCREENING TOOL     IDENTIFICATION  Patient Name: Timothy Knapp Birthdate: [birth year] Sex: male Admission Date (Current Location): 12/10/2016  [place] and [place] Number:  Engineering geologist and Address:  [facility], [address] [place], [place] [address]      Provider Number: [id number]  Attending Physician Name and Address:  Dereck Leep, MD  Relative Name and Phone Number:       Current Level of Care: Hospital Recommended Level of Care: [place] Prior Approval Number:    Date Approved/Denied:   PASRR Number: [id number])  Discharge Plan: SNF    Current Diagnoses: Patient Active Problem List   Diagnosis Date Noted  . S/P total knee arthroplasty 12/10/2016  . Hematuria 12/05/2015  . Elevated liver enzymes 05/30/2015  . Hypertension 09/27/2014  . Benign prostatic hyperplasia 09/27/2014  . Hyperlipidemia 09/27/2014  . Postherpetic neuralgia 06/03/2014  . DDD (degenerative disc disease), thoracic 06/03/2014  . Back pain 05/27/2013    Orientation RESPIRATION BLADDER Height & Weight     Self, Time, Situation, Place  Normal Continent Weight: 158 lb (71.7 kg) Height:     BEHAVIORAL SYMPTOMS/MOOD NEUROLOGICAL BOWEL NUTRITION STATUS      Continent Diet(Diet: Clear Liquid to be Advanced. )  AMBULATORY STATUS COMMUNICATION OF NEEDS Skin   Extensive Assist Verbally Surgical wounds(Incision: Right Knee. )                       Personal Care Assistance Level of Assistance  Bathing, Feeding, Dressing Bathing Assistance: Limited assistance Feeding assistance: Independent Dressing Assistance: Limited assistance     Functional Limitations Info  Sight, Hearing, Speech Sight Info: Adequate Hearing Info: Adequate Speech Info: Adequate    SPECIAL CARE FACTORS FREQUENCY  PT (By licensed PT), OT (By licensed OT)     PT Frequency: (5) OT Frequency: (5)             Contractures      Additional Factors Info  Code Status, Allergies Code Status Info: (Full Code. ) Allergies Info: (No Known Allergies. )           Current Medications (12/10/2016):  This is the current hospital active medication list Current Facility-Administered Medications  Medication Dose Route Frequency Provider Last Rate Last Dose  . 0.9 %  sodium chloride infusion   Intravenous Continuous Hooten, Laurice Record, MD 100 mL/hr at 12/10/16 1601    . acetaminophen (OFIRMEV) IV 1,000 mg  1,000 mg Intravenous Q6H Hooten, Laurice Record, MD      . acetaminophen (TYLENOL) tablet 650 mg  650 mg Oral Q4H PRN Hooten, Laurice Record, MD       Or  . acetaminophen (TYLENOL) suppository 650 mg  650 mg Rectal Q4H PRN Hooten, Laurice Record, MD      . alum & mag hydroxide-simeth (MAALOX/MYLANTA) 200-200-20 MG/5ML suspension 30 mL  30 mL Oral Q4H PRN Hooten, Laurice Record, MD      . atorvastatin (LIPITOR) tablet 40 mg  40 mg Oral QHS Hooten, Laurice Record, MD      . benazepril (LOTENSIN) tablet 20 mg  20 mg Oral Daily Hooten, Laurice Record, MD      . bisacodyl (DULCOLAX) suppository 10 mg  10 mg Rectal Daily PRN Hooten, Laurice Record, MD      . ceFAZolin (ANCEF) 2 g in dextrose 5 % 100 mL IVPB  2 g Intravenous Q6H Hooten, Laurice Record, MD      .  ceFAZolin (ANCEF) 2-4 GM/100ML-% IVPB           . celecoxib (CELEBREX) capsule 200 mg  200 mg Oral Q12H Hooten, Laurice Record, MD      . diphenhydrAMINE (BENADRYL) 12.5 MG/5ML elixir 12.5-25 mg  12.5-25 mg Oral Q4H PRN Hooten, Laurice Record, MD      . Derrill Memo ON 12/11/2016] enoxaparin (LOVENOX) injection 30 mg  30 mg Subcutaneous Q12H Hooten, Laurice Record, MD      . famotidine (PEPCID) 20 MG tablet           . ferrous sulfate tablet 325 mg  325 mg Oral BID WC Hooten, Laurice Record, MD   325 mg at 12/10/16 1649  . [START ON 12/11/2016] gabapentin (NEURONTIN) capsule 400 mg  400 mg Oral q morning - 10a Hooten, Laurice Record, MD      . gabapentin (NEURONTIN) capsule 800 mg  800 mg Oral QHS Hooten, Laurice Record, MD      . magnesium hydroxide  (MILK OF MAGNESIA) suspension 30 mL  30 mL Oral Daily PRN Hooten, Laurice Record, MD      . menthol-cetylpyridinium (CEPACOL) lozenge 3 mg  1 lozenge Oral PRN Hooten, Laurice Record, MD       Or  . phenol (CHLORASEPTIC) mouth spray 1 spray  1 spray Mouth/Throat PRN Hooten, Laurice Record, MD      . metoCLOPramide (REGLAN) tablet 10 mg  10 mg Oral TID AC & HS Hooten, Laurice Record, MD   10 mg at 12/10/16 1649  . morphine 2 MG/ML injection 2 mg  2 mg Intravenous Q2H PRN Hooten, Laurice Record, MD      . multivitamin (RENA-VIT) tablet 1 tablet  1 tablet Oral Daily Hooten, Laurice Record, MD      . ondansetron (ZOFRAN) tablet 4 mg  4 mg Oral Q6H PRN Hooten, Laurice Record, MD       Or  . ondansetron (ZOFRAN) injection 4 mg  4 mg Intravenous Q6H PRN Hooten, Laurice Record, MD      . oxyCODONE (Oxy IR/ROXICODONE) immediate release tablet 10 mg  10 mg Oral Q3H PRN Hooten, Laurice Record, MD      . oxyCODONE (Oxy IR/ROXICODONE) immediate release tablet 5 mg  5 mg Oral Q3H PRN Dereck Leep, MD   5 mg at 12/10/16 1649  . pantoprazole (PROTONIX) EC tablet 40 mg  40 mg Oral BID Dereck Leep, MD   40 mg at 12/10/16 1649  . senna-docusate (Senokot-S) tablet 1 tablet  1 tablet Oral BID Hooten, Laurice Record, MD      . sodium phosphate (FLEET) 7-19 GM/118ML enema 1 enema  1 enema Rectal Once PRN Hooten, Laurice Record, MD      . traMADol Veatrice Bourbon) tablet 50-100 mg  50-100 mg Oral Q4H PRN Hooten, Laurice Record, MD       Facility-Administered Medications Ordered in Other Encounters  Medication Dose Route Frequency Provider Last Rate Last Dose  . acetaminophen (OFIRMEV) IV   Intravenous Anesthesia Intra-op Bernardo Heater, CRNA   1,000 mg at 12/10/16 1425  . bupivacaine (MARCAINE) 0.5 % injection   Other Anesthesia Intra-op Bernardo Heater, CRNA   3 mL at 12/10/16 1132  . ceFAZolin (ANCEF) IVPB 2 g/50 mL premix   Intravenous Anesthesia Intra-op Bernardo Heater, CRNA   2 g at 12/10/16 1145  . dexamethasone (DECADRON) injection   Intravenous Anesthesia Intra-op Bernardo Heater, CRNA   5 mg at  12/10/16 1320  . ePHEDrine injection   Intravenous  Anesthesia Intra-op Bernardo Heater, CRNA   10 mg at 12/10/16 1150  . fentaNYL (SUBLIMAZE) injection    Anesthesia Intra-op Bernardo Heater, CRNA   100 mcg at 12/10/16 1124  . glycopyrrolate (ROBINUL) injection   Intravenous Anesthesia Intra-op Bernardo Heater, CRNA   0.2 mg at 12/10/16 1142  . lidocaine (cardiac) 100 mg/69ml (XYLOCAINE) 20 MG/ML injection 2%   Intravenous Anesthesia Intra-op Bernardo Heater, CRNA   50 mg at 12/10/16 1201  . midazolam (VERSED) 5 MG/5ML injection    Anesthesia Intra-op Bernardo Heater, CRNA   0.5 mg at 12/10/16 1124  . phenylephrine (NEO-SYNEPHRINE) 100 mcg/mL in sodium chloride 0.9 % 100 mL infusion   Intravenous Continuous PRN Bernardo Heater, CRNA   Stopped at 12/10/16 1428  . propofol (DIPRIVAN) 500 MG/50ML infusion    Continuous PRN Bernardo Heater, CRNA 21.5 mL/hr at 12/10/16 1415 50 mcg/kg/min at 12/10/16 1415  . tranexamic acid (CYKLOKAPRON) 1,000 mg in sodium chloride 0.9 % 100 mL IVPB   Intravenous Continuous PRN Bernardo Heater, CRNA   1,000 mg at 12/10/16 1156     Discharge Medications: Please see discharge summary for a list of discharge medications.  Relevant Imaging Results:  Relevant Lab Results:   Additional Information (SSN: [id number]. )  Shaunika Italiano, Veronia Beets, LCSW

## 2016-12-10 NOTE — Transfer of Care (Signed)
Immediate Anesthesia Transfer of Care Note  Patient: Timothy Knapp  Procedure(s) Performed: COMPUTER ASSISTED TOTAL KNEE ARTHROPLASTY (Right Knee)  Patient Location: PACU  Anesthesia Type:Spinal  Level of Consciousness: drowsy  Airway & Oxygen Therapy: Patient Spontanous Breathing and Patient connected to nasal cannula oxygen  Post-op Assessment: Report given to RN and Post -op Vital signs reviewed and stable  Post vital signs: Reviewed and stable  Last Vitals:  Vitals:   12/10/16 0946  BP: 128/69  Pulse: 61  Resp: 18  Temp: (!) 36.3 C  SpO2: 97%    Last Pain:  Vitals:   12/10/16 0946  TempSrc: Temporal         Complications: No apparent anesthesia complications

## 2016-12-11 ENCOUNTER — Encounter: Payer: Self-pay | Admitting: Orthopedic Surgery

## 2016-12-11 LAB — CBC
HCT: 38.1 % — ABNORMAL LOW (ref 40.0–52.0)
HEMOGLOBIN: 12.7 g/dL — AB (ref 13.0–18.0)
MCH: 30.3 pg (ref 26.0–34.0)
MCHC: 33.3 g/dL (ref 32.0–36.0)
MCV: 90.9 fL (ref 80.0–100.0)
PLATELETS: 151 10*3/uL (ref 150–440)
RBC: 4.2 MIL/uL — ABNORMAL LOW (ref 4.40–5.90)
RDW: 13.4 % (ref 11.5–14.5)
WBC: 11.7 10*3/uL — ABNORMAL HIGH (ref 3.8–10.6)

## 2016-12-11 LAB — CREATININE, SERUM
CREATININE: 0.96 mg/dL (ref 0.61–1.24)
GFR calc Af Amer: >60 mL/min (ref >60)

## 2016-12-11 MED ORDER — TRAMADOL HCL 50 MG PO TABS: 50.0000 mg | ORAL_TABLET | ORAL | 0 refills | Status: DC | PRN

## 2016-12-11 MED ORDER — OXYCODONE HCL 5 MG PO TABS: 5.0000 mg | ORAL_TABLET | ORAL | 0 refills | Status: DC | PRN

## 2016-12-11 NOTE — Progress Notes (Signed)
   Subjective: 1 Day Post-Op Procedure(s) (LRB): COMPUTER ASSISTED TOTAL KNEE ARTHROPLASTY (Right) Patient reports pain as 5 on 0-10 scale.   Patient is well, and has had no acute complaints or problems We will start therapy today. Nurse did set patient at sided bed last night and dangle the knee. Plan is to go Home after hospital stay. no nausea and no vomiting Patient denies any chest pains or shortness of breath. Objective: Vital signs in last 24 hours: Temp:  [97.3 F (36.3 C)-98.7 F (37.1 C)] 97.9 F (36.6 C) (12/04 0413) Pulse Rate:  [58-84] 58 (12/04 0413) Resp:  [16-24] 16 (12/04 0413) BP: (91-142)/(46-79) 97/55 (12/04 0413) SpO2:  [92 %-99 %] 95 % (12/04 0413) Weight:  [71.7 kg (158 lb)-72.1 kg (159 lb)] 72.1 kg (159 lb) (12/03 1739) Heels are non tender and elevated off the bed using rolled towels along with bone foam under operative leg Intake/Output from previous day: 12/03 0701 - 12/04 0700 In: 3368.3 [P.O.:660; I.V.:2338.3; IV Piggyback:370] Out: 2190 [IRJJO:8416; Drains:220; Blood:50] Intake/Output this shift: No intake/output data recorded.  No results for input(s): HGB in the last 72 hours. No results for input(s): WBC, RBC, HCT, PLT in the last 72 hours. No results for input(s): NA, K, CL, CO2, BUN, CREATININE, GLUCOSE, CALCIUM in the last 72 hours. No results for input(s): LABPT, INR in the last 72 hours.  EXAM General - Patient is Alert, Appropriate and Oriented Extremity - Neurologically intact Neurovascular intact Sensation intact distally Intact pulses distally Dorsiflexion/Plantar flexion intact Compartment soft Dressing - dressing C/D/I Motor Function - intact, moving foot and toes well on exam. Able to do straight leg raise on his own  Past Medical History:  Diagnosis Date  . Arthritis   . BPH (benign prostatic hypertrophy)   . Hyperlipidemia   . Hypertension     Assessment/Plan: 1 Day Post-Op Procedure(s) (LRB): COMPUTER ASSISTED  TOTAL KNEE ARTHROPLASTY (Right) Active Problems:   S/P total knee arthroplasty  Estimated body mass index is 24.18 kg/m as calculated from the following:   Height as of this encounter: 5\' 8"  (1.727 m).   Weight as of this encounter: 72.1 kg (159 lb). Advance diet Up with therapy D/C IV fluids Plan for discharge tomorrow Discharge home with home health  Labs: Were reviewed DVT Prophylaxis - Lovenox, Foot Pumps and TED hose Weight-Bearing as tolerated to right leg D/C O2 and Pulse OX and try on Room Air Begin working on bowel movement  Ronae Noell R. [place] 12/11/2016, 7:30 AM

## 2016-12-11 NOTE — Evaluation (Signed)
Occupational Therapy Evaluation Patient Details Name: Timothy Knapp MRN: [id number] DOB: [birth year] Today's Date: 12/11/2016    History of Present Illness Pt. was admitted to [facility] for a right TKR   Clinical Impression   Pt. Presents with weakness, limited mobility, and limited ROM which limit his ability to complete ADL, and IADL tasks. Pt. education was provided about A/E use for LE ADLs. Pt. Performed toileting tasks with supervision, and assist for the IV. Pt. Required only supervision for self-grooming standing at the sinkside. Pt. Resides at home with his wife who will be assisting the pt. At home with ADLs, and IADLs as needed. Pt. 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 Pt. Could benefit from skilled OT services for ADL training, A/E training, and pt. Education about home modification, and DME. Pt. Plans to return home upon discharge with his wife to assist as needed with ADLs, and IADLs.     Follow Up Recommendations  No OT follow up    Equipment Recommendations       Recommendations for Other Services       Precautions / Restrictions Precautions Precautions: Fall;Knee Precaution Booklet Issued: No Restrictions Weight Bearing Restrictions: Yes RLE Weight Bearing: Weight bearing as tolerated             ADL either performed or assessed with clinical judgement   ADL Overall ADL's : Needs assistance/impaired Eating/Feeding: Independent;Set up   Grooming: Supervision/safety;Standing   Upper Body Bathing: Set up;Independent   Lower Body Bathing: Set up;Minimal assistance   Upper Body Dressing : Set up;Independent   Lower Body Dressing: Set up;Minimal assistance   Toilet Transfer: Regular Toilet;Supervision/safety(Assist with IV pole, and lines)   Toileting- Clothing Manipulation and Hygiene: Supervision/safety       Functional mobility during ADLs:  Supervision/safety General ADL Comments: Pt. education was provided about A/E use for LE ADLs.     Vision         Perception     Praxis      Pertinent Vitals/Pain Pain Assessment: 0-10 Pain Score: 2  Faces Pain Scale: Hurts a little bit Pain Location: right knee Pain Descriptors / Indicators: Operative site guarding Pain Intervention(s): Limited activity within patient's tolerance;Ice applied;Monitored during session     Hand Dominance Right   Extremity/Trunk Assessment Upper Extremity Assessment Upper Extremity Assessment: Overall WFL for tasks assessed     Communication Communication Communication: No difficulties   Cognition Arousal/Alertness: Awake/alert Behavior During Therapy: WFL for tasks assessed/performed Overall Cognitive Status: Within Functional Limits for tasks assessed                                     General Comments       Exercises   Shoulder Instructions      Home Living Family/patient expects to be discharged to:: Private residence Living Arrangements: Spouse/significant other Available Help at Discharge: Family Type of Home: House Home Access: Stairs to enter Technical brewer of Steps: 4 Entrance Stairs-Rails: Right Home Layout: One level     Bathroom Shower/Tub: Walk-in shower         Home Equipment: Shower seat;Bedside commode          Prior Functioning/Environment Level of Independence: Independent        Comments: Pt. 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 Pt. was playing  pickle ball in the senior games.        OT Problem List: Decreased strength;Decreased range of motion;Decreased knowledge of precautions;Decreased knowledge of use of DME or AE;Impaired UE functional use;Pain;Decreased activity tolerance      OT Treatment/Interventions: Self-care/ADL training;Therapeutic activities;Therapeutic exercise;Patient/family education     OT Goals(Current goals can be found in the care plan section) Acute Rehab OT Goals Patient Stated Goal: To return home with his wife OT Goal Formulation: With patient Potential to Achieve Goals: Good  OT Frequency: Min 2X/week   Barriers to D/C:            Co-evaluation              AM-PAC PT "6 Clicks" Daily Activity     Outcome Measure Help from another person eating meals?: None Help from another person taking care of personal grooming?: A Little Help from another person toileting, which includes using toliet, bedpan, or urinal?: A Little Help from another person bathing (including washing, rinsing, drying)?: A Little Help from another person to put on and taking off regular upper body clothing?: None Help from another person to put on and taking off regular lower body clothing?: A Little 6 Click Score: 20   End of Session Equipment Utilized During Treatment: Gait belt Nurse Communication: Mobility status  Activity Tolerance: Patient tolerated treatment well Patient left: in chair  OT Visit Diagnosis: Muscle weakness (generalized) (M62.81)                Time: 1010-1049 OT Time Calculation (min): 39 min Charges:  OT General Charges $OT Visit: 1 Visit OT Evaluation $OT Eval Moderate Complexity: 1 Mod G-Codes: OT G-codes **NOT FOR INPATIENT CLASS** Functional Limitation: Self care Self Care Current Status (I3474): At least 1 percent but less than 20 percent impaired, limited or restricted Self Care Goal Status (Q5956): 0 percent impaired, limited or restricted   Harrel Carina, MS, OTR/L   Harrel Carina, MS, OTR/L 12/11/2016, 12:45 PM

## 2016-12-11 NOTE — Progress Notes (Signed)
Physical Therapy Treatment Patient Details Name: Timothy Knapp MRN: [id number] DOB: 1940/01/17 Today's Date: 12/11/2016    History of Present Illness Pt. was admitted to [facility] for a right TKR    PT Comments    Pt is making great progress towards goals. Able to navigate RN station with improved fluid gait pattern. Good endurance with there-ex and reviewed HEP. Answered all PT related questions. Pt very motivated to perform therapy. Will continue to progress. Will perform stair training prior to dc.   Follow Up Recommendations  Home health PT     Equipment Recommendations  None recommended by PT    Recommendations for Other Services       Precautions / Restrictions Precautions Precautions: Fall;Knee Precaution Booklet Issued: Yes (comment) Restrictions Weight Bearing Restrictions: Yes RLE Weight Bearing: Weight bearing as tolerated    Mobility  Bed Mobility Overal bed mobility: Needs Assistance Bed Mobility: Sit to Supine       Sit to supine: Supervision   General bed mobility comments: Initially received in chair. At end of session, assisted pt back to bed. Safe technique performed.  Transfers Overall transfer level: Needs assistance Equipment used: Rolling walker (2 wheeled) Transfers: Sit to/from Stand Sit to Stand: Supervision         General transfer comment: upright posture noted. Safe technique performed  Ambulation/Gait Ambulation/Gait assistance: Modified independent (Device/Increase time) Ambulation Distance (Feet): 225 Feet Assistive device: Rolling walker (2 wheeled) Gait Pattern/deviations: Step-through pattern     General Gait Details: ambulated with fluid and reciprocal gait pattern. Upright posture noted and symmetrical step length. Last 61' performed without AD with safe technique   Stairs            Wheelchair Mobility    Modified Rankin (Stroke Patients Only)       Balance                                            Cognition Arousal/Alertness: Awake/alert Behavior During Therapy: WFL for tasks assessed/performed Overall Cognitive Status: Within Functional Limits for tasks assessed                                        Exercises Other Exercises Other Exercises: Seated ther-ex performed on R LE including ankle pumps, quad sets, SLRs, hip ab/ad, and SAQ. All ther-ex performed x 10 reps on R LE with supervision. Written HEP reviewed and discussed regarding frequency and duration    General Comments        Pertinent Vitals/Pain Pain Assessment: 0-10 Pain Score: 2  Pain Location: right knee Pain Descriptors / Indicators: Operative site guarding Pain Intervention(s): Limited activity within patient's tolerance;Ice applied    Home Living                      Prior Function            PT Goals (current goals can now be found in the care plan section) Acute Rehab PT Goals Patient Stated Goal: To return home with his wife PT Goal Formulation: With patient Time For Goal Achievement: 12/25/16 Potential to Achieve Goals: Good Progress towards PT goals: Progressing toward goals    Frequency    BID      PT Plan Current plan remains appropriate  Co-evaluation              AM-PAC PT "6 Clicks" Daily Activity  Outcome Measure  Difficulty turning over in bed (including adjusting bedclothes, sheets and blankets)?: None Difficulty moving from lying on back to sitting on the side of the bed? : None Difficulty sitting down on and standing up from a chair with arms (e.g., wheelchair, bedside commode, etc,.)?: None Help needed moving to and from a bed to chair (including a wheelchair)?: None Help needed walking in hospital room?: None Help needed climbing 3-5 steps with a railing? : A Little 6 Click Score: 23    End of Session Equipment Utilized During Treatment: Gait belt Activity Tolerance: Patient tolerated treatment well Patient left: in  bed;with bed alarm set;with SCD's reapplied Nurse Communication: Mobility status PT Visit Diagnosis: Muscle weakness (generalized) (M62.81);Difficulty in walking, not elsewhere classified (R26.2);Pain Pain - Right/Left: Right Pain - part of body: Knee     Time: 1355-1418 PT Time Calculation (min) (ACUTE ONLY): 23 min  Charges:  $Gait Training: 8-22 mins $Therapeutic Exercise: 8-22 mins                    G Codes:  Functional Assessment Tool Used: AM-PAC 6 Clicks Basic Mobility Functional Limitation: Mobility: Walking and moving around Mobility: Walking and Moving Around Current Status (N6295): At least 1 percent but less than 20 percent impaired, limited or restricted Mobility: Walking and Moving Around Goal Status [phone]): 0 percent impaired, limited or restricted    Timothy Knapp, PT, DPT [phone]    Timothy Knapp 12/11/2016, 4:55 PM

## 2016-12-11 NOTE — Discharge Summary (Signed)
Physician Discharge Summary  Patient ID: Timothy Knapp MRN: [id number] DOB/AGE: [birth year] 77 y.o.  Admit date: 12/10/2016 Discharge date: 12/12/2016  Admission Diagnoses:  PRIMARY OSTEOARTHRITIS OF RIGHT KNEE   Discharge Diagnoses: Patient Active Problem List   Diagnosis Date Noted  . S/P total knee arthroplasty 12/10/2016  . Hematuria 12/05/2015  . Elevated liver enzymes 05/30/2015  . Hypertension 09/27/2014  . Benign prostatic hyperplasia 09/27/2014  . Hyperlipidemia 09/27/2014  . Postherpetic neuralgia 06/03/2014  . DDD (degenerative disc disease), thoracic 06/03/2014  . Back pain 05/27/2013    Past Medical History:  Diagnosis Date  . Arthritis   . BPH (benign prostatic hypertrophy)   . Hyperlipidemia   . Hypertension      Transfusion: No transfusions on this admission   Consultants (if any):   Discharged Condition: Improved  Hospital Course: Timothy Knapp is an 76 y.o. male who was admitted 12/10/2016 with a diagnosis of degenerative arthrosis right knee and went to the operating room on 12/10/2016 and underwent the above named procedures.    Surgeries:Procedure(s): COMPUTER ASSISTED TOTAL KNEE ARTHROPLASTY on 12/10/2016  PRE-OPERATIVE DIAGNOSIS: Degenerative arthrosis of the right knee, primary  POST-OPERATIVE DIAGNOSIS:  Same  PROCEDURE:  Right total knee arthroplasty using computer-assisted navigation  SURGEON:  Marciano Sequin. M.D.  ASSISTANT:  Vance Peper, PA (present and scrubbed throughout the case, critical for assistance with exposure, retraction, instrumentation, and closure)  ANESTHESIA: spinal  ESTIMATED BLOOD LOSS: 50 mL  FLUIDS REPLACED: 1500 mL of crystalloid  TOURNIQUET TIME: 80 minutes  DRAINS: 2 medium Hemovac drains  SOFT TISSUE RELEASES: Anterior cruciate ligament, posterior cruciate ligament, deep medial collateral ligament, patellofemoral ligament  IMPLANTS UTILIZED: DePuy Attune size 5 posterior stabilized  femoral component (cemented), size 6 rotating platform tibial component (cemented), 38 mm medialized dome patella (cemented), and a 6 mm stabilized rotating platform polyethylene insert.  INDICATIONS FOR SURGERY: Timothy Knapp is a 76 y.o. year old male with a long history of progressive knee pain. X-rays demonstrated severe degenerative changes in tricompartmental fashion. The patient had not seen any significant improvement despite conservative nonsurgical intervention. After discussion of the risks and benefits of surgical intervention, the patient expressed understanding of the risks benefits and agree with plans for total knee arthroplasty.   The risks, benefits, and alternatives were discussed at length including but not limited to the risks of infection, bleeding, nerve injury, stiffness, blood clots, the need for revision surgery, cardiopulmonary complications, among others, and they were willing to proceed.   Patient tolerated the surgery well. No complications .Patient was taken to PACU where she was stabilized and then transferred to the orthopedic floor.  Patient started on Lovenox 30 mg q 12 hrs. Foot pumps applied bilaterally at 80 mm hgb. Heels elevated off bed with rolled towels. No evidence of DVT. Calves non tender. Negative Homan. Physical therapy started on day #1 for gait training and transfer with OT starting on  day #1 for ADL and assisted devices. Patient has done well with therapy. Ambulated greater than 200 feet upon being discharged. Patient was able to ascend and descend 4 steps safely and independently  Patient's IV And Foley were discontinued on day #1 with Hemovac being discontinued on day #2. Dressing was changed on day 2 prior to patient being discharged   He was given perioperative antibiotics:  Anti-infectives (From admission, onward)   Start     Dose/Rate Route Frequency Ordered Stop   12/10/16 1800  ceFAZolin (ANCEF) 2 g in  dextrose 5 % 100 mL IVPB     2  g 240 mL/hr over 30 Minutes Intravenous Every 6 hours 12/10/16 1549 12/11/16 1759   12/10/16 0948  ceFAZolin (ANCEF) 2-4 GM/100ML-% IVPB    Comments:  Dewayne Hatch   : cabinet override      12/10/16 0948 12/10/16 2159   12/10/16 0600  ceFAZolin (ANCEF) IVPB 2g/100 mL premix  Status:  Discontinued     2 g 200 mL/hr over 30 Minutes Intravenous On call to O.R. 12/09/16 2153 12/10/16 3536    .  He was fitted with AV 1 compression foot pump devices, instructed on heel pumps, early ambulation, and fitted with TED stockings bilaterally for DVT prophylaxis.  He benefited maximally from the hospital stay and there were no complications.    Recent vital signs:  Vitals:   12/10/16 2327 12/11/16 0413  BP: (!) 104/53 (!) 97/55  Pulse: 64 (!) 58  Resp: 16 16  Temp:  97.9 F (36.6 C)  SpO2: 92% 95%    Recent laboratory studies:  Lab Results  Component Value Date   HGB 15.2 11/28/2016   HGB 14.5 12/05/2015   HGB 14.9 09/27/2014   Lab Results  Component Value Date   WBC 5.3 11/28/2016   PLT 158 11/28/2016   Lab Results  Component Value Date   INR 0.95 11/28/2016   Lab Results  Component Value Date   NA 138 11/28/2016   K 4.5 11/28/2016   CL 105 11/28/2016   CO2 28 11/28/2016   BUN 23 (H) 11/28/2016   CREATININE 0.88 11/28/2016   GLUCOSE 95 11/28/2016    Discharge Medications:   Allergies as of 12/11/2016   No Known Allergies     Medication List    STOP taking these medications   aspirin 81 MG chewable tablet   naproxen sodium 220 MG tablet Commonly known as:  ALEVE     TAKE these medications   atorvastatin 40 MG tablet Commonly known as:  LIPITOR Take 1 tablet (40 mg total) by mouth at bedtime. What changed:  how much to take   b complex vitamins tablet Take 1 tablet by mouth daily.   benazepril 20 MG tablet Commonly known as:  LOTENSIN Take 1 tablet (20 mg total) daily by mouth.   diclofenac sodium 1 % Gel Commonly known as:  VOLTAREN Apply 1 g  daily as needed topically (back of knees).   gabapentin 800 MG tablet Commonly known as:  NEURONTIN Limited 1-2 tabs by mouth per day if tolerated What changed:    how much to take  how to take this  when to take this  additional instructions   GLUCOSAMINE-CHONDROITIN PO Take 1 tablet 3 (three) times daily by mouth.   LYSINE PO Take 100 mg by mouth daily.   NON FORMULARY Take 5 drops 2 (two) times daily by mouth.   Oxycodone HCl 10 MG Tabs Take 1 tablet (10 mg total) by mouth 3 (three) times daily. What changed:  Another medication with the same name was added. Make sure you understand how and when to take each.   oxyCODONE 5 MG immediate release tablet Commonly known as:  Oxy IR/ROXICODONE Take 1 tablet (5 mg total) by mouth every 4 (four) hours as needed for moderate pain ((score 4 to 6)). What changed:  You were already taking a medication with the same name, and this prescription was added. Make sure you understand how and when to take each.   traMADol  50 MG tablet Commonly known as:  ULTRAM Take 1-2 tablets (50-100 mg total) by mouth every 4 (four) hours as needed for moderate pain.   Turmeric 500 MG Caps Take 500 mg daily by mouth. Reported on 05/30/2015            Durable Medical Equipment  (From admission, onward)        Start     Ordered   12/10/16 1550  DME Walker rolling  Once    Question:  Patient needs a walker to treat with the following condition  Answer:  Total knee replacement status   12/10/16 1549   12/10/16 1550  DME Bedside commode  Once    Question:  Patient needs a bedside commode to treat with the following condition  Answer:  Total knee replacement status   12/10/16 1549      Diagnostic Studies: Dg Knee Right Port  Result Date: 12/10/2016 CLINICAL DATA:  Status post right knee replacement today. EXAM: PORTABLE RIGHT KNEE - 1-2 VIEW COMPARISON:  None. FINDINGS: Right total knee arthroplasty is in place. Surgical staples and drain  are noted. The device is located. No fracture. IMPRESSION: Status post right total knee replacement.  No acute finding. Electronically Signed   By: Inge Rise M.D.   On: 12/10/2016 15:02    Disposition: Final discharge disposition not confirmed  Discharge Instructions    Diet - low sodium heart healthy   Complete by:  As directed    Increase activity slowly   Complete by:  As directed       Follow-up Information    Watt Climes, PA On 12/25/2016.   Specialty:  Physician Assistant Why:  at 1:15pm Contact information: [place] [address] [phone]        Dereck Leep, MD On 01/22/2017.   Specialty:  Orthopedic Surgery Why:  at 10:45am Contact information: [place] [address] [phone]            Signed: Watt Climes 12/11/2016, 7:35 AM

## 2016-12-11 NOTE — Progress Notes (Signed)
Clinical Social Worker (CSW) received SNF consult. PT is recommending home health. RN case manager aware of above. Please reconsult if future social work needs arise. CSW signing off.   Elveria Lauderbaugh, LCSW [phone] 

## 2016-12-11 NOTE — Care Management Note (Signed)
Case Management Note  Patient Details  Name: Timothy Knapp MRN: [id number] Date of Birth: 1940-03-31  Subjective/Objective: POD # 1 right TKA. Met with patient and his wife at bedside to discuss home health. PT recommending HHPT. Offered choice of home health agencies. Referral to [facility] for HHPT. Patient has a walker and denies the need for bsc. Pharmacy:[facility] [address] [phone]. Will call Lovenox prior to discharge.                      Action/Plan: [facility] for HHPT.   Expected Discharge Date:                  Expected Discharge Plan:  [place]  In-House Referral:     Discharge planning Services  CM Consult  Post Acute Care Choice:  Home Health Choice offered to:  Patient, Spouse  DME Arranged:    DME Agency:     HH Arranged:  PT [place]:  [facility] (formerly [facility])  Status of Service:  In process, will continue to follow  If discussed at Long Length of Stay Meetings, dates discussed:    Additional Comments:  Jolly Mango, RN 12/11/2016, 1:40 PM

## 2016-12-11 NOTE — Progress Notes (Signed)
Alert and oriented. Wife at bedside. Tolerated sitting on side of be with no complaints. Bone foam, polar care in place. VSS.  Uses incentive spirometer. No acute distress noted

## 2016-12-11 NOTE — Anesthesia Postprocedure Evaluation (Signed)
Anesthesia Post Note  Patient: Timothy Knapp  Procedure(s) Performed: COMPUTER ASSISTED TOTAL KNEE ARTHROPLASTY (Right Knee)  Patient location during evaluation: Nursing Unit Anesthesia Type: Spinal Level of consciousness: awake, awake and alert and oriented Pain management: pain level controlled Vital Signs Assessment: post-procedure vital signs reviewed and stable Respiratory status: spontaneous breathing, nonlabored ventilation and respiratory function stable Cardiovascular status: stable Postop Assessment: no headache, no backache, no apparent nausea or vomiting and adequate PO intake Anesthetic complications: no     Last Vitals:  Vitals:   12/10/16 2327 12/11/16 0413  BP: (!) 104/53 (!) 97/55  Pulse: 64 (!) 58  Resp: 16 16  Temp:  36.6 C  SpO2: 92% 95%    Last Pain:  Vitals:   12/11/16 0628  TempSrc:   PainSc: 5                  Adriene Knipfer,  Iley Deignan R

## 2016-12-11 NOTE — Evaluation (Signed)
Physical Therapy Evaluation Patient Details Name: Timothy Knapp MRN: [id number] DOB: Apr 12, 1940 Today's Date: 12/11/2016   History of Present Illness  Pt admitted for R TKR.   Clinical Impression  Pt is a pleasant 76 year old male who was admitted for R TKR. Pt performs bed mobility, transfers, and ambulation with cga and use of RW. Pt demonstrates ability to perform 10 SLRs with independence, therefore does not require KI for mobility. Pt demonstrates deficits with strength/ROM. Pt appears very motivated to participate in therapy. Would benefit from skilled PT to address above deficits and promote optimal return to PLOF. Recommend transition to [place] upon discharge from acute hospitalization.       Follow Up Recommendations Home health PT    Equipment Recommendations  None recommended by PT    Recommendations for Other Services       Precautions / Restrictions Precautions Precautions: Fall;Knee Precaution Booklet Issued: No Restrictions Weight Bearing Restrictions: Yes RLE Weight Bearing: Weight bearing as tolerated      Mobility  Bed Mobility Overal bed mobility: Needs Assistance Bed Mobility: Supine to Sit     Supine to sit: Min guard     General bed mobility comments: safe technique performed with ability to sit at EOB. No dizziness noted  Transfers Overall transfer level: Needs assistance Equipment used: Rolling walker (2 wheeled) Transfers: Sit to/from Stand Sit to Stand: Min guard         General transfer comment: transfers performed with upright posture and used RW. Safe technique noted  Ambulation/Gait Ambulation/Gait assistance: Min guard Ambulation Distance (Feet): 100 Feet Assistive device: Rolling walker (2 wheeled) Gait Pattern/deviations: Step-to pattern     General Gait Details: ambulated with slow gait pattern, progress to reciprocal gait. UPright posture noted and cued to look forward instead of down at toes.  Stairs             Wheelchair Mobility    Modified Rankin (Stroke Patients Only)       Balance Overall balance assessment: Needs assistance Sitting-balance support: Feet supported Sitting balance-Leahy Scale: Normal     Standing balance support: Bilateral upper extremity supported Standing balance-Leahy Scale: Good                               Pertinent Vitals/Pain Pain Assessment: Faces Faces Pain Scale: Hurts a little bit Pain Location: R knee Pain Descriptors / Indicators: Operative site guarding Pain Intervention(s): Limited activity within patient's tolerance;Ice applied;Monitored during session    Emma expects to be discharged to:: Private residence Living Arrangements: Spouse/significant other Available Help at Discharge: Family Type of Home: House Home Access: Stairs to enter Entrance Stairs-Rails: Right Entrance Stairs-Number of Steps: 4 Home Layout: One level Home Equipment: Environmental consultant - 2 wheels;Shower seat;Bedside commode      Prior Function Level of Independence: Independent         Comments: very active     Hand Dominance        Extremity/Trunk Assessment   Upper Extremity Assessment Upper Extremity Assessment: Overall WFL for tasks assessed    Lower Extremity Assessment Lower Extremity Assessment: Generalized weakness(R LE grossly 3/5; L LE grossly 5/5)       Communication   Communication: No difficulties  Cognition Arousal/Alertness: Awake/alert Behavior During Therapy: WFL for tasks assessed/performed Overall Cognitive Status: Within Functional Limits for tasks assessed  General Comments      Exercises Total Joint Exercises Goniometric ROM: R knee AAROM: 1-80 degrees Other Exercises Other Exercises: supine ther-ex performed on R LE including ankle pumps, quad sets, SLR, and hip abd/add. ALl ther-ex performed x 10 reps with supervision. Safe technique  performed   Assessment/Plan    PT Assessment Patient needs continued PT services  PT Problem List Decreased strength;Decreased mobility;Decreased knowledge of use of DME;Pain;Decreased range of motion       PT Treatment Interventions DME instruction;Gait training;Stair training;Therapeutic exercise    PT Goals (Current goals can be found in the Care Plan section)  Acute Rehab PT Goals Patient Stated Goal: to go home PT Goal Formulation: With patient Time For Goal Achievement: 12/25/16 Potential to Achieve Goals: Good    Frequency BID   Barriers to discharge        Co-evaluation               AM-PAC PT "6 Clicks" Daily Activity  Outcome Measure Difficulty turning over in bed (including adjusting bedclothes, sheets and blankets)?: Unable Difficulty moving from lying on back to sitting on the side of the bed? : Unable Difficulty sitting down on and standing up from a chair with arms (e.g., wheelchair, bedside commode, etc,.)?: Unable Help needed moving to and from a bed to chair (including a wheelchair)?: A Little Help needed walking in hospital room?: A Little Help needed climbing 3-5 steps with a railing? : A Little 6 Click Score: 12    End of Session Equipment Utilized During Treatment: Gait belt Activity Tolerance: Patient tolerated treatment well Patient left: in chair;with SCD's reapplied(A&O x 4; safe without alarm) Nurse Communication: Mobility status PT Visit Diagnosis: Muscle weakness (generalized) (M62.81);Difficulty in walking, not elsewhere classified (R26.2);Pain Pain - Right/Left: Right Pain - part of body: Knee    Time: 9735-3299 PT Time Calculation (min) (ACUTE ONLY): 26 min   Charges:   PT Evaluation $PT Eval Low Complexity: 1 Low PT Treatments $Therapeutic Exercise: 8-22 mins   PT G Codes:   PT G-Codes **NOT FOR INPATIENT CLASS** Functional Assessment Tool Used: AM-PAC 6 Clicks Basic Mobility Functional Limitation: Mobility: Walking and  moving around Mobility: Walking and Moving Around Current Status (M4268): At least 60 percent but less than 80 percent impaired, limited or restricted Mobility: Walking and Moving Around Goal Status [phone]): At least 40 percent but less than 60 percent impaired, limited or restricted    Timothy Knapp, PT, DPT [phone]   Esaias Cleavenger 12/11/2016, 12:16 PM

## 2016-12-12 MED ORDER — ENOXAPARIN SODIUM 40 MG/0.4ML ~~LOC~~ SOLN: 40.0000 mg | SUBCUTANEOUS | 0 refills | Status: DC

## 2016-12-12 NOTE — Progress Notes (Signed)
Patient was discharged home with wife. IV removed with cath intact. Wife able to give patient's Lovenox injection without difficultly. Reviewed discharge instructions and scripts, along with last dose of meds given reviewed. Allowed time for questions. Belongings packed and sent with patient.

## 2016-12-12 NOTE — Progress Notes (Signed)
Physical Therapy Treatment Patient Details Name: Timothy Knapp MRN: [id number] DOB: [birth year] Today's Date: 12/12/2016    History of Present Illness Pt. was admitted to [facility] for a right TKR    PT Comments    Pt is making good progress towards goals and is safe to dc this date. Able to perform HEP safely. Good endurance with ambulation and stair training and great AAROM. Pt motivated to perform therapy. Will dc at this time, RN aware.   Follow Up Recommendations  Home health PT     Equipment Recommendations  None recommended by PT    Recommendations for Other Services       Precautions / Restrictions Precautions Precautions: Fall;Knee Precaution Booklet Issued: Yes (comment) Restrictions Weight Bearing Restrictions: Yes RLE Weight Bearing: Weight bearing as tolerated    Mobility  Bed Mobility               General bed mobility comments: received in chair, not performed  Transfers Overall transfer level: Modified independent Equipment used: Rolling walker (2 wheeled) Transfers: Sit to/from Stand Sit to Stand: Modified independent (Device/Increase time)         General transfer comment: upright posture noted. Safe technique performed  Ambulation/Gait Ambulation/Gait assistance: Modified independent (Device/Increase time) Ambulation Distance (Feet): 300 Feet Assistive device: Rolling walker (2 wheeled) Gait Pattern/deviations: Step-through pattern     General Gait Details: ambulated using fluid and reciprocal gait pattern. Upright posture noted. Increased gait speed noted   Stairs Stairs: Yes   Stair Management: One rail Right Number of Stairs: 4 General stair comments: safe technique with therapist demonstrating prior to performance. Step to gait pattern performed  Wheelchair Mobility    Modified Rankin (Stroke Patients Only)       Balance                                            Cognition Arousal/Alertness:  Awake/alert Behavior During Therapy: WFL for tasks assessed/performed Overall Cognitive Status: Within Functional Limits for tasks assessed                                        Exercises Total Joint Exercises Goniometric ROM: R knee AAROM: 0-94 degrees Other Exercises Other Exercises: Seated ther-ex performed on R LE including ankle pumps, quad sets, SLRs, hip ab/ad, and seated knee flexion stretches, LAQ, and SAQ. All ther-ex performed x 12 reps on R LE with supervision. Written HEP reviewed and discussed regarding frequency and duration    General Comments        Pertinent Vitals/Pain Pain Assessment: 0-10 Pain Score: 1  Pain Location: right knee Pain Descriptors / Indicators: Operative site guarding Pain Intervention(s): Limited activity within patient's tolerance;Ice applied    Home Living                      Prior Function            PT Goals (current goals can now be found in the care plan section) Acute Rehab PT Goals Patient Stated Goal: To return home with his wife PT Goal Formulation: With patient Time For Goal Achievement: 12/25/16 Potential to Achieve Goals: Good Progress towards PT goals: Progressing toward goals    Frequency    BID  PT Plan Current plan remains appropriate    Co-evaluation              AM-PAC PT "6 Clicks" Daily Activity  Outcome Measure  Difficulty turning over in bed (including adjusting bedclothes, sheets and blankets)?: None Difficulty moving from lying on back to sitting on the side of the bed? : None Difficulty sitting down on and standing up from a chair with arms (e.g., wheelchair, bedside commode, etc,.)?: None Help needed moving to and from a bed to chair (including a wheelchair)?: None Help needed walking in hospital room?: None Help needed climbing 3-5 steps with a railing? : None 6 Click Score: 24    End of Session Equipment Utilized During Treatment: Gait belt Activity  Tolerance: Patient tolerated treatment well Patient left: in chair;with SCD's reapplied(not falls risk) Nurse Communication: Mobility status PT Visit Diagnosis: Muscle weakness (generalized) (M62.81);Difficulty in walking, not elsewhere classified (R26.2);Pain Pain - Right/Left: Right Pain - part of body: Knee     Time: 0857-0920 PT Time Calculation (min) (ACUTE ONLY): 23 min  Charges:  $Gait Training: 8-22 mins $Therapeutic Exercise: 8-22 mins                    G Codes:  Functional Assessment Tool Used: AM-PAC 6 Clicks Basic Mobility Functional Limitation: Mobility: Walking and moving around Mobility: Walking and Moving Around Current Status (Z6109): 0 percent impaired, limited or restricted Mobility: Walking and Moving Around Goal Status (U0454): 0 percent impaired, limited or restricted    Timothy Knapp, PT, DPT [phone]    Timothy Knapp 12/12/2016, 10:45 AM

## 2016-12-12 NOTE — Care Management Note (Signed)
Case Management Note  Patient Details  Name: Timothy Knapp MRN: [id number] Date of Birth: Oct 18, 1940  Subjective/Objective:  Discharging today                  Action/Plan: Cost of lovenox is $ 81.89. [facility] notified of discharge.   Expected Discharge Date:  12/12/16               Expected Discharge Plan:  [place]  In-House Referral:     Discharge planning Services  CM Consult  Post Acute Care Choice:  Home Health Choice offered to:  Patient, Spouse  DME Arranged:    DME Agency:     HH Arranged:  PT [place]:  [facility] (formerly [facility])  Status of Service:  Completed, signed off  If discussed at [employer], dates discussed:    Additional Comments:  Jolly Mango, RN 12/12/2016, 8:51 AM

## 2016-12-12 NOTE — Progress Notes (Signed)
   Subjective: 2 Days Post-Op Procedure(s) (LRB): COMPUTER ASSISTED TOTAL KNEE ARTHROPLASTY (Right) Patient reports pain as moderate.   Patient is well, and has had no acute complaints or problems Patient did very well with therapy yesterday. Was able to ambulate around the nurse's desk and had 80 of motion with flexion. Still needs to do stairs today. Plan is to go Home after hospital stay. no nausea and no vomiting Patient denies any chest pains or shortness of breath. Objective: Vital signs in last 24 hours: Temp:  [97.9 F (36.6 C)-98.6 F (37 C)] 98.5 F (36.9 C) (12/05 0529) Pulse Rate:  [56-63] 56 (12/05 0529) Resp:  [18] 18 (12/04 1952) BP: (117-135)/(60-68) 117/60 (12/05 0529) SpO2:  [93 %-96 %] 94 % (12/05 0529) well approximated incision Heels are non tender and elevated off the bed using rolled towels Intake/Output from previous day: 12/04 0701 - 12/05 0700 In: 2123.3 [P.O.:600; I.V.:1523.3] Out: 380 [Drains:380] Intake/Output this shift: No intake/output data recorded.  Recent Labs    12/11/16 0841  HGB 12.7*   Recent Labs    12/11/16 0841  WBC 11.7*  RBC 4.20*  HCT 38.1*  PLT 151   Recent Labs    12/11/16 0841  CREATININE 0.96   No results for input(s): LABPT, INR in the last 72 hours.  EXAM General - Patient is Alert, Appropriate and Oriented Extremity - Neurologically intact Neurovascular intact Sensation intact distally Intact pulses distally Dorsiflexion/Plantar flexion intact No cellulitis present Compartment soft Dressing - dressing C/D/I Motor Function - intact, moving foot and toes well on exam.    Past Medical History:  Diagnosis Date  . Arthritis   . BPH (benign prostatic hypertrophy)   . Hyperlipidemia   . Hypertension     Assessment/Plan: 2 Days Post-Op Procedure(s) (LRB): COMPUTER ASSISTED TOTAL KNEE ARTHROPLASTY (Right) Active Problems:   S/P total knee arthroplasty  Estimated body mass index is 24.18 kg/m as  calculated from the following:   Height as of this encounter: 5\' 8"  (1.727 m).   Weight as of this encounter: 72.1 kg (159 lb). Up with therapy Discharge home with home health  Labs: No labs DVT Prophylaxis - Lovenox, Foot Pumps and TED hose Weight-Bearing as tolerated to right leg Hemovac was discontinued on today's visit. Tips of the Hemovac appeared to be intact Please wash operative leg and apply TED stockings to both legs Patient needs a bowel movement Please change dressing prior to patient being discharged and give the patient 2 extra honeycomb dressings to take home  [place]. [place] 12/12/2016, 7:11 AM

## 2016-12-13 DIAGNOSIS — Z7901 Long term (current) use of anticoagulants: Secondary | ICD-10-CM | POA: Diagnosis not present

## 2016-12-13 DIAGNOSIS — Z9181 History of falling: Secondary | ICD-10-CM | POA: Diagnosis not present

## 2016-12-13 DIAGNOSIS — I10 Essential (primary) hypertension: Secondary | ICD-10-CM | POA: Diagnosis not present

## 2016-12-13 DIAGNOSIS — Z471 Aftercare following joint replacement surgery: Secondary | ICD-10-CM | POA: Diagnosis not present

## 2016-12-13 DIAGNOSIS — E785 Hyperlipidemia, unspecified: Secondary | ICD-10-CM | POA: Diagnosis not present

## 2016-12-13 DIAGNOSIS — B0229 Other postherpetic nervous system involvement: Secondary | ICD-10-CM | POA: Diagnosis not present

## 2016-12-13 DIAGNOSIS — Z96651 Presence of right artificial knee joint: Secondary | ICD-10-CM | POA: Diagnosis not present

## 2016-12-15 DIAGNOSIS — I10 Essential (primary) hypertension: Secondary | ICD-10-CM | POA: Diagnosis not present

## 2016-12-15 DIAGNOSIS — B0229 Other postherpetic nervous system involvement: Secondary | ICD-10-CM | POA: Diagnosis not present

## 2016-12-15 DIAGNOSIS — Z7901 Long term (current) use of anticoagulants: Secondary | ICD-10-CM | POA: Diagnosis not present

## 2016-12-15 DIAGNOSIS — E785 Hyperlipidemia, unspecified: Secondary | ICD-10-CM | POA: Diagnosis not present

## 2016-12-15 DIAGNOSIS — Z471 Aftercare following joint replacement surgery: Secondary | ICD-10-CM | POA: Diagnosis not present

## 2016-12-15 DIAGNOSIS — Z9181 History of falling: Secondary | ICD-10-CM | POA: Diagnosis not present

## 2016-12-15 DIAGNOSIS — Z96651 Presence of right artificial knee joint: Secondary | ICD-10-CM | POA: Diagnosis not present

## 2016-12-24 DIAGNOSIS — I10 Essential (primary) hypertension: Secondary | ICD-10-CM | POA: Diagnosis not present

## 2016-12-24 DIAGNOSIS — B0229 Other postherpetic nervous system involvement: Secondary | ICD-10-CM | POA: Diagnosis not present

## 2016-12-24 DIAGNOSIS — Z9181 History of falling: Secondary | ICD-10-CM | POA: Diagnosis not present

## 2016-12-24 DIAGNOSIS — Z471 Aftercare following joint replacement surgery: Secondary | ICD-10-CM | POA: Diagnosis not present

## 2016-12-24 DIAGNOSIS — E785 Hyperlipidemia, unspecified: Secondary | ICD-10-CM | POA: Diagnosis not present

## 2016-12-24 DIAGNOSIS — Z7901 Long term (current) use of anticoagulants: Secondary | ICD-10-CM | POA: Diagnosis not present

## 2016-12-24 DIAGNOSIS — Z96651 Presence of right artificial knee joint: Secondary | ICD-10-CM | POA: Diagnosis not present

## 2016-12-25 DIAGNOSIS — M25561 Pain in right knee: Secondary | ICD-10-CM | POA: Diagnosis not present

## 2016-12-25 DIAGNOSIS — Z96651 Presence of right artificial knee joint: Secondary | ICD-10-CM | POA: Diagnosis not present

## 2016-12-27 DIAGNOSIS — Z96651 Presence of right artificial knee joint: Secondary | ICD-10-CM | POA: Diagnosis not present

## 2017-01-02 DIAGNOSIS — Z96651 Presence of right artificial knee joint: Secondary | ICD-10-CM | POA: Diagnosis not present

## 2017-01-02 DIAGNOSIS — M25561 Pain in right knee: Secondary | ICD-10-CM | POA: Diagnosis not present

## 2017-01-04 DIAGNOSIS — M25561 Pain in right knee: Secondary | ICD-10-CM | POA: Diagnosis not present

## 2017-01-04 DIAGNOSIS — Z96651 Presence of right artificial knee joint: Secondary | ICD-10-CM | POA: Diagnosis not present

## 2017-01-07 DIAGNOSIS — M25561 Pain in right knee: Secondary | ICD-10-CM | POA: Diagnosis not present

## 2017-01-07 DIAGNOSIS — Z96651 Presence of right artificial knee joint: Secondary | ICD-10-CM | POA: Diagnosis not present

## 2017-01-09 DIAGNOSIS — Z96651 Presence of right artificial knee joint: Secondary | ICD-10-CM | POA: Diagnosis not present

## 2017-01-09 DIAGNOSIS — M25561 Pain in right knee: Secondary | ICD-10-CM | POA: Diagnosis not present

## 2017-01-11 DIAGNOSIS — Z96651 Presence of right artificial knee joint: Secondary | ICD-10-CM | POA: Diagnosis not present

## 2017-01-14 DIAGNOSIS — Z96651 Presence of right artificial knee joint: Secondary | ICD-10-CM | POA: Diagnosis not present

## 2017-01-16 DIAGNOSIS — M25561 Pain in right knee: Secondary | ICD-10-CM | POA: Diagnosis not present

## 2017-01-16 DIAGNOSIS — Z96651 Presence of right artificial knee joint: Secondary | ICD-10-CM | POA: Diagnosis not present

## 2017-01-17 ENCOUNTER — Encounter: Payer: Self-pay | Admitting: Anesthesiology

## 2017-01-17 ENCOUNTER — Other Ambulatory Visit: Payer: Self-pay

## 2017-01-17 ENCOUNTER — Ambulatory Visit: Payer: PPO | Attending: Anesthesiology | Admitting: Anesthesiology

## 2017-01-17 VITALS — BP 123/65 | HR 58 | Temp 98.5°F | Resp 16 | Ht 68.0 in | Wt 158.0 lb

## 2017-01-17 DIAGNOSIS — M5134 Other intervertebral disc degeneration, thoracic region: Secondary | ICD-10-CM | POA: Insufficient documentation

## 2017-01-17 DIAGNOSIS — B0229 Other postherpetic nervous system involvement: Secondary | ICD-10-CM | POA: Diagnosis not present

## 2017-01-17 DIAGNOSIS — M546 Pain in thoracic spine: Secondary | ICD-10-CM | POA: Diagnosis not present

## 2017-01-17 DIAGNOSIS — Z79891 Long term (current) use of opiate analgesic: Secondary | ICD-10-CM | POA: Insufficient documentation

## 2017-01-17 DIAGNOSIS — M545 Low back pain: Secondary | ICD-10-CM | POA: Diagnosis present

## 2017-01-17 DIAGNOSIS — Z79899 Other long term (current) drug therapy: Secondary | ICD-10-CM | POA: Insufficient documentation

## 2017-01-17 DIAGNOSIS — G8929 Other chronic pain: Secondary | ICD-10-CM | POA: Diagnosis not present

## 2017-01-17 DIAGNOSIS — F1190 Opioid use, unspecified, uncomplicated: Secondary | ICD-10-CM | POA: Diagnosis not present

## 2017-01-17 MED ORDER — OXYCODONE HCL 10 MG PO TABS: 10.0000 mg | ORAL_TABLET | Freq: Three times a day (TID) | ORAL | 0 refills | Status: DC

## 2017-01-17 NOTE — Progress Notes (Signed)
Nursing Pain Medication Assessment:  Safety precautions to be maintained throughout the outpatient stay will include: orient to surroundings, keep bed in low position, maintain call bell within reach at all times, provide assistance with transfer out of bed and ambulation.  Medication Inspection Compliance: Pill count conducted under aseptic conditions, in front of the patient. Neither the pills nor the bottle was removed from the patient's sight at any time. Once count was completed pills were immediately returned to the patient in their original bottle.  Medication: Oxycodone IR Pill/Patch Count: 9 of 90 pills remain Pill/Patch Appearance: Markings consistent with prescribed medication Bottle Appearance: Standard pharmacy container. Clearly labeled. Filled Date: 5 / 12 / 2019 Last Medication intake:  Today

## 2017-01-18 DIAGNOSIS — Z96651 Presence of right artificial knee joint: Secondary | ICD-10-CM | POA: Diagnosis not present

## 2017-01-20 NOTE — Progress Notes (Signed)
Subjective:  Patient ID: Timothy Knapp, male    DOB: 28-Jan-1940  Age: 77 y.o. MRN: [id number]  CC: Back Pain (lower)   Procedure: None  HPI Timothy Knapp presents for reevaluation.  He was last seen 2 months ago and has been doing well with his current regimen.  He continues to have right-sided flank pain consistent with his baseline postherpetic neuralgia symptoms.  No significant changes noted in the symptom quality characteristic or distribution.  The pain has remained quite recalcitrant.  He is taking his opioid medications as prescribed and based on his narcotic assessment sheet he is continue to derive good functional lifestyle improvement with his medications with minimal side effects.  Timothy Knapp he is in his usual state of health.  He maintains that he generally gets approximately 4 hours of good relief with the medications.  He does have breakthrough pain periodically despite this.  He denies evidence of any diverting or illicit use.  He has had some recent surgery that was reported and is recovering from that as well.  Outpatient Medications Prior to Visit  Medication Sig Dispense Refill  . atorvastatin (LIPITOR) 40 MG tablet Take 1 tablet (40 mg total) by mouth at bedtime. (Patient taking differently: Take 20 mg by mouth at bedtime. ) 90 tablet 4  . b complex vitamins tablet Take 1 tablet by mouth daily.     . benazepril (LOTENSIN) 20 MG tablet Take 1 tablet (20 mg total) daily by mouth. 90 tablet 1  . diclofenac sodium (VOLTAREN) 1 % GEL Apply 1 g daily as needed topically (back of knees).     . gabapentin (NEURONTIN) 800 MG tablet Limited 1-2 tabs by mouth per day if tolerated (Patient taking differently: Take 400-800 mg 2 (two) times daily by mouth. Takes (0.5 tablet) 400 mg in the morning and (1 tablet) 800 mg in the evening) 60 tablet 2  . GLUCOSAMINE-CHONDROITIN PO Take 1 tablet 3 (three) times daily by mouth.    . LYSINE PO Take 100 mg by mouth daily.    . NON FORMULARY Take 5  drops 2 (two) times daily by mouth.     . Turmeric 500 MG CAPS Take 500 mg daily by mouth. Reported on 05/30/2015    . Oxycodone HCl 10 MG TABS Take 1 tablet (10 mg total) by mouth 3 (three) times daily. 90 tablet 0  . enoxaparin (LOVENOX) 40 MG/0.4ML injection Inject 0.4 mLs (40 mg total) into the skin daily for 14 days. 14 Syringe 0  . oxyCODONE (OXY IR/ROXICODONE) 5 MG immediate release tablet Take 1 tablet (5 mg total) by mouth every 4 (four) hours as needed for moderate pain ((score 4 to 6)). (Patient not taking: Reported on 01/17/2017) 60 tablet 0  . traMADol (ULTRAM) 50 MG tablet Take 1-2 tablets (50-100 mg total) by mouth every 4 (four) hours as needed for moderate pain. (Patient not taking: Reported on 01/17/2017) 60 tablet 0   No facility-administered medications prior to visit.     Review of Systems CNS: No confusion or sedation Cardiac: No angina or palpitations GI: No abdominal pain or constipation   Objective:  BP 123/65 (BP Location: Right Arm, Patient Position: Sitting, Cuff Size: Normal)   Pulse (!) 58   Temp 98.5 F (36.9 C) (Oral)   Resp 16   Ht 5\' 8"  (1.727 m)   Wt 158 lb (71.7 kg)   SpO2 96%   BMI 24.02 kg/m    BP Readings from Last 3  Encounters:  01/17/17 123/65  12/12/16 (!) 150/74  12/05/16 (!) 143/71     Wt Readings from Last 3 Encounters:  01/17/17 158 lb (71.7 kg)  12/10/16 159 lb (72.1 kg)  12/05/16 158 lb (71.7 kg)     Physical Exam Pt is alert and oriented PERRL EOMI HEART IS RRR no murmur or rub LCTA no wheezing or rales MUSCULOSKELETAL reveals a mildly antalgic gait and his lower extremity strength and muscle tone and bulk is at baseline  Labs  No results found for: HGBA1C Lab Results  Component Value Date   LDLCALC 102 (H) 12/05/2015   CREATININE 0.96 12/11/2016    -------------------------------------------------------------------------------------------------------------------- Lab Results  Component Value Date   WBC 11.7  (H) 12/11/2016   HGB 12.7 (L) 12/11/2016   HCT 38.1 (L) 12/11/2016   PLT 151 12/11/2016   GLUCOSE 95 11/28/2016   CHOL 161 05/28/2016   TRIG 125 05/28/2016   HDL 49 12/05/2015   LDLCALC 102 (H) 12/05/2015   ALT 27 11/28/2016   AST 43 (H) 11/28/2016   NA 138 11/28/2016   K 4.5 11/28/2016   CL 105 11/28/2016   CREATININE 0.96 12/11/2016   BUN 23 (H) 11/28/2016   CO2 28 11/28/2016   TSH 2.490 12/05/2015   INR 0.95 11/28/2016    --------------------------------------------------------------------------------------------------------------------- No results found.   Assessment & Plan:   Timothy Knapp was seen today for back pain.  Diagnoses and all orders for this visit:  Postherpetic neuralgia  Chronic right-sided thoracic back pain  DDD (degenerative disc disease), thoracic  Chronic, continuous use of opioids  Other orders -     Discontinue: Oxycodone HCl 10 MG TABS; Take 1 tablet (10 mg total) by mouth 3 (three) times daily. -     Oxycodone HCl 10 MG TABS; Take 1 tablet (10 mg total) by mouth 3 (three) times daily.        ----------------------------------------------------------------------------------------------------------------------  Problem List Items Addressed This Visit      Unprioritized   Back pain   Relevant Medications   Oxycodone HCl 10 MG TABS   DDD (degenerative disc disease), thoracic   Relevant Medications   Oxycodone HCl 10 MG TABS   Postherpetic neuralgia - Primary    Other Visit Diagnoses    Chronic, continuous use of opioids            ----------------------------------------------------------------------------------------------------------------------  1. Postherpetic neuralgia We will refill his medications.  He currently has a prescription for January 7 and February 13, 2017.  We reviewed the [facility] practitioner database information and it is appropriate.  He is to continue his current opioid regimen.  We have talked about  options regarding administration of synergistic drugs such as Naprosyn 220 mg 1 p.o. twice daily to be taken 5 of 7 days/week.  2. Chronic right-sided thoracic back pain Continue with gabapentin  3. DDD (degenerative disc disease), thoracic Continue with core stretching strengthening exercises and aerobic conditioning  4. Chronic, continuous use of opioids Return to clinic in 2 months and continue following up with his primary care physicians.    ----------------------------------------------------------------------------------------------------------------------  I am having [name] maintain his b complex vitamins, Turmeric, gabapentin, atorvastatin, diclofenac sodium, LYSINE PO, NON FORMULARY, benazepril, GLUCOSAMINE-CHONDROITIN PO, oxyCODONE, traMADol, enoxaparin, and Oxycodone HCl.   Meds ordered this encounter  Medications  . DISCONTD: Oxycodone HCl 10 MG TABS    Sig: Take 1 tablet (10 mg total) by mouth 3 (three) times daily.    Dispense:  90 tablet  Refill:  0    Do not fill until [id number]  . Oxycodone HCl 10 MG TABS    Sig: Take 1 tablet (10 mg total) by mouth 3 (three) times daily.    Dispense:  90 tablet    Refill:  0    Do not fill until [id number]   Patient's Medications  New Prescriptions   No medications on file  Previous Medications   ATORVASTATIN (LIPITOR) 40 MG TABLET    Take 1 tablet (40 mg total) by mouth at bedtime.   B COMPLEX VITAMINS TABLET    Take 1 tablet by mouth daily.    BENAZEPRIL (LOTENSIN) 20 MG TABLET    Take 1 tablet (20 mg total) daily by mouth.   DICLOFENAC SODIUM (VOLTAREN) 1 % GEL    Apply 1 g daily as needed topically (back of knees).    ENOXAPARIN (LOVENOX) 40 MG/0.4ML INJECTION    Inject 0.4 mLs (40 mg total) into the skin daily for 14 days.   GABAPENTIN (NEURONTIN) 800 MG TABLET    Limited 1-2 tabs by mouth per day if tolerated   GLUCOSAMINE-CHONDROITIN PO    Take 1 tablet 3 (three) times daily by mouth.   LYSINE PO    Take  100 mg by mouth daily.   NON FORMULARY    Take 5 drops 2 (two) times daily by mouth.    OXYCODONE (OXY IR/ROXICODONE) 5 MG IMMEDIATE RELEASE TABLET    Take 1 tablet (5 mg total) by mouth every 4 (four) hours as needed for moderate pain ((score 4 to 6)).   TRAMADOL (ULTRAM) 50 MG TABLET    Take 1-2 tablets (50-100 mg total) by mouth every 4 (four) hours as needed for moderate pain.   TURMERIC 500 MG CAPS    Take 500 mg daily by mouth. Reported on 05/30/2015  Modified Medications   Modified Medication Previous Medication   OXYCODONE HCL 10 MG TABS Oxycodone HCl 10 MG TABS      Take 1 tablet (10 mg total) by mouth 3 (three) times daily.    Take 1 tablet (10 mg total) by mouth 3 (three) times daily.  Discontinued Medications   No medications on file   ----------------------------------------------------------------------------------------------------------------------  Follow-up: Return in about 2 months (around 03/17/2017) for evaluation, med refill.    [name], MD

## 2017-01-21 DIAGNOSIS — M25561 Pain in right knee: Secondary | ICD-10-CM | POA: Diagnosis not present

## 2017-01-21 DIAGNOSIS — Z96651 Presence of right artificial knee joint: Secondary | ICD-10-CM | POA: Diagnosis not present

## 2017-01-22 DIAGNOSIS — Z96651 Presence of right artificial knee joint: Secondary | ICD-10-CM | POA: Diagnosis not present

## 2017-01-28 ENCOUNTER — Encounter: Payer: Self-pay | Admitting: Family Medicine

## 2017-01-28 ENCOUNTER — Ambulatory Visit (INDEPENDENT_AMBULATORY_CARE_PROVIDER_SITE_OTHER): Payer: PPO | Admitting: Family Medicine

## 2017-01-28 VITALS — BP 124/73 | HR 64 | Ht 67.0 in | Wt 164.0 lb

## 2017-01-28 DIAGNOSIS — Z1211 Encounter for screening for malignant neoplasm of colon: Secondary | ICD-10-CM

## 2017-01-28 DIAGNOSIS — Z0001 Encounter for general adult medical examination with abnormal findings: Secondary | ICD-10-CM

## 2017-01-28 DIAGNOSIS — E7800 Pure hypercholesterolemia, unspecified: Secondary | ICD-10-CM

## 2017-01-28 DIAGNOSIS — Z0000 Encounter for general adult medical examination without abnormal findings: Secondary | ICD-10-CM

## 2017-01-28 DIAGNOSIS — R3912 Poor urinary stream: Secondary | ICD-10-CM | POA: Diagnosis not present

## 2017-01-28 DIAGNOSIS — I10 Essential (primary) hypertension: Secondary | ICD-10-CM

## 2017-01-28 DIAGNOSIS — N401 Enlarged prostate with lower urinary tract symptoms: Secondary | ICD-10-CM

## 2017-01-28 DIAGNOSIS — Z96651 Presence of right artificial knee joint: Secondary | ICD-10-CM

## 2017-01-28 DIAGNOSIS — Z7189 Other specified counseling: Secondary | ICD-10-CM | POA: Diagnosis not present

## 2017-01-28 LAB — URINALYSIS, ROUTINE W REFLEX MICROSCOPIC
BILIRUBIN UA: NEGATIVE
Glucose, UA: NEGATIVE
LEUKOCYTES UA: NEGATIVE
NITRITE UA: NEGATIVE
PH UA: 5.5 (ref 5.0–7.5)
Protein, UA: NEGATIVE
RBC UA: NEGATIVE
SPEC GRAV UA: 1.03 (ref 1.005–1.030)
Urobilinogen, Ur: 0.2 mg/dL (ref 0.2–1.0)

## 2017-01-28 MED ORDER — TAMSULOSIN HCL 0.4 MG PO CAPS: 0.4000 mg | ORAL_CAPSULE | Freq: Every day | ORAL | 3 refills | Status: DC

## 2017-01-28 MED ORDER — BENAZEPRIL HCL 20 MG PO TABS: 20.0000 mg | ORAL_TABLET | Freq: Every day | ORAL | 4 refills | Status: DC

## 2017-01-28 MED ORDER — DICLOFENAC SODIUM 1 % TD GEL: 2.0000 g | Freq: Four times a day (QID) | TRANSDERMAL | 3 refills | Status: DC

## 2017-01-28 MED ORDER — ATORVASTATIN CALCIUM 40 MG PO TABS: 40.0000 mg | ORAL_TABLET | Freq: Every day | ORAL | 4 refills | Status: DC

## 2017-01-28 NOTE — Assessment & Plan Note (Signed)
Still PT

## 2017-01-28 NOTE — Assessment & Plan Note (Signed)
A voluntary discussion about advance care planning including the explanation and discussion of advance directives was extensively discussed  with the patient.  Explanation about the health care proxy and Living will was reviewed and packet with forms with explanation of how to fill them out was given.    

## 2017-01-28 NOTE — Assessment & Plan Note (Signed)
The current medical regimen is effective;  continue present plan and medications.  

## 2017-01-28 NOTE — Assessment & Plan Note (Signed)
Add flomax

## 2017-01-28 NOTE — Progress Notes (Signed)
BP 124/73   Pulse 64   Ht 5\' 7"  (1.702 m)   Wt 164 lb (74.4 kg)   SpO2 98%   BMI 25.69 kg/m    Subjective:    Patient ID: Timothy Knapp, male    DOB: [birth year], 77 y.o.   MRN: [id number]  HPI: Timothy Knapp is a 77 y.o. male  Annual exam Knee replacement rehab is going slowly. Knee replacement having difficulty with stretching past 90 degrees and pain wondering if there is some topicals he can use.  Reviewed topicals such as Voltaren and. Patient still on oxycodone 10 mg 3 times a day.  Has been on this for years. Patient also takes cholesterol without problems 40 mg tablets but only taking 20.  Takes benazepril without problems.  The gabapentin from pain clinic.   Relevant past medical, surgical, family and social history reviewed and updated as indicated. Interim medical history since our last visit reviewed. Allergies and medications reviewed and updated.  Review of Systems  Constitutional: Negative.   HENT: Negative.   Eyes: Negative.   Respiratory: Negative.   Cardiovascular: Negative.   Gastrointestinal: Negative.   Endocrine: Negative.   Genitourinary: Negative.   Musculoskeletal: Negative.   Skin: Negative.   Allergic/Immunologic: Negative.   Neurological: Negative.   Hematological: Negative.   Psychiatric/Behavioral: Negative.     Per HPI unless specifically indicated above     Objective:    BP 124/73   Pulse 64   Ht 5\' 7"  (1.702 m)   Wt 164 lb (74.4 kg)   SpO2 98%   BMI 25.69 kg/m   Wt Readings from Last 3 Encounters:  01/28/17 164 lb (74.4 kg)  01/17/17 158 lb (71.7 kg)  12/10/16 159 lb (72.1 kg)    Physical Exam  Constitutional: He is oriented to person, place, and time. He appears well-developed and well-nourished.  HENT:  Head: Normocephalic and atraumatic.  Right Ear: External ear normal.  Left Ear: External ear normal.  Eyes: Conjunctivae and EOM are normal. Pupils are equal, round, and reactive to light.  Neck: Normal range of  motion. Neck supple.  Cardiovascular: Normal rate, regular rhythm, normal heart sounds and intact distal pulses.  Pulmonary/Chest: Effort normal and breath sounds normal.  Abdominal: Soft. Bowel sounds are normal. There is no splenomegaly or hepatomegaly.  Genitourinary: Rectum normal and penis normal.  Genitourinary Comments: BPH changes  Musculoskeletal: Normal range of motion.  Neurological: He is alert and oriented to person, place, and time. He has normal reflexes.  Skin: No rash noted. No erythema.  Psychiatric: He has a normal mood and affect. His behavior is normal. Judgment and thought content normal.    Results for orders placed or performed during the hospital encounter of 12/10/16  Creatinine, serum  Result Value Ref Range   Creatinine, Ser 0.96 0.61 - 1.24 mg/dL   GFR calc non Af Amer >60 >60 mL/min   GFR calc Af Amer >60 >60 mL/min  CBC  Result Value Ref Range   WBC 11.7 (H) 3.8 - 10.6 K/uL   RBC 4.20 (L) 4.40 - 5.90 MIL/uL   Hemoglobin 12.7 (L) 13.0 - 18.0 g/dL   HCT 38.1 (L) 40.0 - 52.0 %   MCV 90.9 80.0 - 100.0 fL   MCH 30.3 26.0 - 34.0 pg   MCHC 33.3 32.0 - 36.0 g/dL   RDW 13.4 11.5 - 14.5 %   Platelets 151 150 - 440 K/uL  ABO/Rh  Result Value Ref Range  ABO/RH(D) A POS       Assessment & Plan:   Problem List Items Addressed This Visit      Cardiovascular and Mediastinum   Hypertension    The current medical regimen is effective;  continue present plan and medications.       Relevant Medications   atorvastatin (LIPITOR) 40 MG tablet   benazepril (LOTENSIN) 20 MG tablet   Other Relevant Orders   Comprehensive metabolic panel   Lipid panel   CBC with Differential/Platelet   Urinalysis, Routine w reflex microscopic     Genitourinary   BPH (benign prostatic hyperplasia)    Add flomax      Relevant Medications   tamsulosin (FLOMAX) 0.4 MG CAPS capsule   Other Relevant Orders   PSA     Other   Hyperlipidemia    The current medical regimen  is effective;  continue present plan and medications.       Relevant Medications   atorvastatin (LIPITOR) 40 MG tablet   benazepril (LOTENSIN) 20 MG tablet   Other Relevant Orders   Lipid panel   Urinalysis, Routine w reflex microscopic   S/P total knee arthroplasty    Still PT      Relevant Orders   CBC with Differential/Platelet   Advanced care planning/counseling discussion    A voluntary discussion about advance care planning including the explanation and discussion of advance directives was extensively discussed  with the patient.  Explanation about the health care proxy and Living will was reviewed and packet with forms with explanation of how to fill them out was given.          Other Visit Diagnoses    Colon cancer screening    -  Primary   Relevant Orders   Ambulatory referral to Gastroenterology   PE (physical exam), annual           Follow up plan: Return in about 4 weeks (around 02/25/2017), or if symptoms worsen or fail to improve, for med check.

## 2017-01-29 LAB — COMPREHENSIVE METABOLIC PANEL
ALT: 18 IU/L (ref 0–44)
AST: 29 IU/L (ref 0–40)
Albumin/Globulin Ratio: 1.7 (ref 1.2–2.2)
Albumin: 4.3 g/dL (ref 3.5–4.8)
Alkaline Phosphatase: 68 IU/L (ref 39–117)
BUN/Creatinine Ratio: 18 (ref 10–24)
BUN: 18 mg/dL (ref 8–27)
Bilirubin Total: 0.4 mg/dL (ref 0.0–1.2)
CO2: 25 mmol/L (ref 20–29)
Calcium: 9.3 mg/dL (ref 8.6–10.2)
Chloride: 100 mmol/L (ref 96–106)
Creatinine, Ser: 1 mg/dL (ref 0.76–1.27)
GFR calc Af Amer: 84 mL/min/{1.73_m2} (ref >59)
GFR calc non Af Amer: 72 mL/min/{1.73_m2} (ref >59)
Globulin, Total: 2.6 g/dL (ref 1.5–4.5)
Glucose: 103 mg/dL — ABNORMAL HIGH (ref 65–99)
Potassium: 4.9 mmol/L (ref 3.5–5.2)
Sodium: 141 mmol/L (ref 134–144)
Total Protein: 6.9 g/dL (ref 6.0–8.5)

## 2017-01-29 LAB — LIPID PANEL
Chol/HDL Ratio: 4.2 ratio (ref 0.0–5.0)
Cholesterol, Total: 185 mg/dL (ref 100–199)
HDL: 44 mg/dL (ref >39)
LDL Calculated: 106 mg/dL — ABNORMAL HIGH (ref 0–99)
Triglycerides: 174 mg/dL — ABNORMAL HIGH (ref 0–149)
VLDL Cholesterol Cal: 35 mg/dL (ref 5–40)

## 2017-01-29 LAB — CBC WITH DIFFERENTIAL/PLATELET
Basophils Absolute: 0.1 10*3/uL (ref 0.0–0.2)
Basos: 1 %
EOS (ABSOLUTE): 0.3 10*3/uL (ref 0.0–0.4)
Eos: 5 %
Hematocrit: 40.8 % (ref 37.5–51.0)
Hemoglobin: 13.4 g/dL (ref 13.0–17.7)
Immature Grans (Abs): 0 10*3/uL (ref 0.0–0.1)
Immature Granulocytes: 0 %
Lymphocytes Absolute: 1.9 10*3/uL (ref 0.7–3.1)
Lymphs: 29 %
MCH: 28.9 pg (ref 26.6–33.0)
MCHC: 32.8 g/dL (ref 31.5–35.7)
MCV: 88 fL (ref 79–97)
Monocytes Absolute: 0.7 10*3/uL (ref 0.1–0.9)
Monocytes: 11 %
Neutrophils Absolute: 3.5 10*3/uL (ref 1.4–7.0)
Neutrophils: 54 %
Platelets: 214 10*3/uL (ref 150–379)
RBC: 4.64 x10E6/uL (ref 4.14–5.80)
RDW: 14 % (ref 12.3–15.4)
WBC: 6.4 10*3/uL (ref 3.4–10.8)

## 2017-01-29 LAB — PSA: Prostate Specific Ag, Serum: 5.2 ng/mL — ABNORMAL HIGH (ref 0.0–4.0)

## 2017-01-30 ENCOUNTER — Telehealth: Payer: Self-pay | Admitting: Family Medicine

## 2017-01-30 DIAGNOSIS — R972 Elevated prostate specific antigen [PSA]: Secondary | ICD-10-CM

## 2017-01-30 DIAGNOSIS — Z96651 Presence of right artificial knee joint: Secondary | ICD-10-CM | POA: Diagnosis not present

## 2017-01-30 NOTE — Telephone Encounter (Signed)
Phone call Discussed with patient elevated PSA referred to urology

## 2017-02-01 DIAGNOSIS — Z96651 Presence of right artificial knee joint: Secondary | ICD-10-CM | POA: Diagnosis not present

## 2017-02-04 DIAGNOSIS — Z96651 Presence of right artificial knee joint: Secondary | ICD-10-CM | POA: Diagnosis not present

## 2017-02-04 DIAGNOSIS — M25561 Pain in right knee: Secondary | ICD-10-CM | POA: Diagnosis not present

## 2017-02-06 DIAGNOSIS — Z96651 Presence of right artificial knee joint: Secondary | ICD-10-CM | POA: Diagnosis not present

## 2017-02-06 DIAGNOSIS — M25561 Pain in right knee: Secondary | ICD-10-CM | POA: Diagnosis not present

## 2017-02-11 DIAGNOSIS — M25561 Pain in right knee: Secondary | ICD-10-CM | POA: Diagnosis not present

## 2017-02-11 DIAGNOSIS — Z96651 Presence of right artificial knee joint: Secondary | ICD-10-CM | POA: Diagnosis not present

## 2017-02-13 ENCOUNTER — Ambulatory Visit: Payer: PPO | Admitting: Urology

## 2017-02-13 ENCOUNTER — Encounter: Payer: Self-pay | Admitting: Urology

## 2017-02-13 VITALS — BP 142/72 | HR 76 | Ht 67.0 in | Wt 164.0 lb

## 2017-02-13 DIAGNOSIS — R972 Elevated prostate specific antigen [PSA]: Secondary | ICD-10-CM

## 2017-02-13 DIAGNOSIS — Z96651 Presence of right artificial knee joint: Secondary | ICD-10-CM | POA: Diagnosis not present

## 2017-02-13 DIAGNOSIS — N401 Enlarged prostate with lower urinary tract symptoms: Secondary | ICD-10-CM | POA: Diagnosis not present

## 2017-02-14 ENCOUNTER — Encounter: Payer: Self-pay | Admitting: Urology

## 2017-02-14 DIAGNOSIS — R972 Elevated prostate specific antigen [PSA]: Secondary | ICD-10-CM | POA: Insufficient documentation

## 2017-02-14 NOTE — Progress Notes (Signed)
02/13/2017 7:22 AM   Timothy Knapp 1940/10/24 [id number]  Referring provider: Guadalupe Maple, MD [address] [place], [place] [address]  Chief Complaint  Patient presents with  . Elevated PSA    HPI: 77 year old male referred for evaluation of an elevated PSA.  PSA performed on 01/28/2017 was slightly elevated at 5.2.  Previous PSA in November 2017 was 3.5 and was 2.6 in November 2016.  He was recently started on tamsulosin for nocturia which has significantly improved his symptoms.  I PSS completed today was 5/35.  He denies dysuria or gross hematuria.  He denies flank, abdominal, pelvic or scrotal pain.   PMH: Past Medical History:  Diagnosis Date  . Arthritis   . BPH (benign prostatic hypertrophy)   . Hyperlipidemia   . Hypertension     Surgical History: Past Surgical History:  Procedure Laterality Date  . KNEE ARTHROPLASTY Right 12/10/2016   Procedure: COMPUTER ASSISTED TOTAL KNEE ARTHROPLASTY;  Surgeon: Dereck Leep, MD;  Location: [facility] ORS;  Service: Orthopedics;  Laterality: Right;  . PROSTATE SURGERY N/A   . SKIN CANCER EXCISION  11/09/2015    Home Medications:  Allergies as of 02/13/2017   No Known Allergies     Medication List        Accurate as of 02/13/17 11:59 PM. Always use your most recent med list.          atorvastatin 40 MG tablet Commonly known as:  LIPITOR Take 1 tablet (40 mg total) by mouth at bedtime.   b complex vitamins tablet Take 1 tablet by mouth daily.   benazepril 20 MG tablet Commonly known as:  LOTENSIN Take 1 tablet (20 mg total) by mouth daily.   diclofenac sodium 1 % Gel Commonly known as:  VOLTAREN Apply 2 g topically 4 (four) times daily.   gabapentin 800 MG tablet Commonly known as:  NEURONTIN Limited 1-2 tabs by mouth per day if tolerated   Oxycodone HCl 10 MG Tabs Take 1 tablet (10 mg total) by mouth 3 (three) times daily.   tamsulosin 0.4 MG Caps capsule Commonly known as:  FLOMAX Take 1 capsule (0.4  mg total) by mouth daily.   Turmeric 500 MG Caps Take 500 mg daily by mouth. Reported on 05/30/2015       Allergies: No Known Allergies  Family History: Family History  Problem Relation Age of Onset  . Heart disease Mother   . Prostate cancer Neg Hx   . Bladder Cancer Neg Hx   . Kidney cancer Neg Hx     Social History:  reports that  has never smoked. he has never used smokeless tobacco. He reports that he does not drink alcohol or use drugs.  ROS: UROLOGY Frequent Urination?: No Hard to postpone urination?: No Burning/pain with urination?: No Get up at night to urinate?: No Leakage of urine?: No Urine stream starts and stops?: No Trouble starting stream?: No Do you have to strain to urinate?: No Blood in urine?: No Urinary tract infection?: No Sexually transmitted disease?: No Injury to kidneys or bladder?: No Painful intercourse?: No Weak stream?: No Erection problems?: No Penile pain?: No  Gastrointestinal Nausea?: No Vomiting?: No Indigestion/heartburn?: No Diarrhea?: No Constipation?: No  Constitutional Fever: No Night sweats?: No Weight loss?: No Fatigue?: No  Skin Skin rash/lesions?: No Itching?: No  Eyes Blurred vision?: No Double vision?: No  Ears/Nose/Throat Sore throat?: No Sinus problems?: No  Hematologic/Lymphatic Swollen glands?: No Easy bruising?: No  Cardiovascular Leg  swelling?: No Chest pain?: No  Respiratory Cough?: No Shortness of breath?: No  Endocrine Excessive thirst?: No  Musculoskeletal Back pain?: No Joint pain?: No  Neurological Headaches?: No Dizziness?: No  Psychologic Depression?: No Anxiety?: No  Physical Exam: BP (!) 142/72 (BP Location: Left Arm, Patient Position: Sitting, Cuff Size: Normal)   Pulse 76   Ht 5\' 7"  (1.702 m)   Wt 164 lb (74.4 kg)   BMI 25.69 kg/m   Constitutional:  Alert and oriented, No acute distress. HEENT: [place] AT, moist mucus membranes.  Trachea midline, no  masses. Cardiovascular: No clubbing, cyanosis, or edema. Respiratory: Normal respiratory effort, no increased work of breathing. GI: Abdomen is soft, nontender, nondistended, no abdominal masses GU: No CVA tenderness.  Penis without lesions, testes descended bilaterally without masses or tenderness.  Prostate 50 g, slightly boggy without nodules or induration Skin: No rashes, bruises or suspicious lesions. Lymph: No cervical or inguinal adenopathy. Neurologic: Grossly intact, no focal deficits, moving all 4 extremities. Psychiatric: Normal mood and affect.  Laboratory Data: Lab Results  Component Value Date   WBC 6.4 01/28/2017   HGB 13.4 01/28/2017   HCT 40.8 01/28/2017   MCV 88 01/28/2017   PLT 214 01/28/2017    Lab Results  Component Value Date   CREATININE 1.00 01/28/2017    Lab Results  Component Value Date   PSA1 5.2 (H) 01/28/2017   PSA1 3.5 12/05/2015   PSA1 2.6 09/27/2014    Assessment & Plan:  77 year old male with a mildly elevated PSA.  Potential causes were discussed including BPH, inflammation and prostate cancer.  Although PSA is a prostate cancer screening test he was informed that cancer is not the most common cause of an elevated PSA. Other potential causes including BPH and inflammation were discussed. He was informed that the only way to adequately diagnose prostate cancer would be a transrectal ultrasound and biopsy of the prostate. The procedure was discussed including potential risks of bleeding and infection/sepsis. He was also informed that a negative biopsy does not conclusively rule out the possibility that prostate cancer may be present and that continued monitoring is required. The use of newer adjunctive blood tests including PHI and 4Kscore were discussed. The use of multiparametric prostate MRI was also discussed however is not typically used for initial evaluation of an elevated PSA. Continued periodic surveillance was also discussed.   He would  initially like to have his PSA repeated.  He states he has an appointment with Dr. Jeananne Rama in approximately 3 weeks and will have his PSA repeated at that time.    Abbie Sons, [place] [address], [place], [place] [address] [phone]

## 2017-02-15 ENCOUNTER — Encounter: Payer: Self-pay | Admitting: Family Medicine

## 2017-02-15 ENCOUNTER — Other Ambulatory Visit: Payer: Self-pay

## 2017-02-15 ENCOUNTER — Telehealth: Payer: Self-pay

## 2017-02-15 DIAGNOSIS — Z1211 Encounter for screening for malignant neoplasm of colon: Secondary | ICD-10-CM

## 2017-02-15 DIAGNOSIS — M25561 Pain in right knee: Secondary | ICD-10-CM | POA: Diagnosis not present

## 2017-02-15 DIAGNOSIS — Z96651 Presence of right artificial knee joint: Secondary | ICD-10-CM | POA: Diagnosis not present

## 2017-02-15 NOTE — Telephone Encounter (Signed)
Gastroenterology Pre-Procedure Review  Request Date: 03/20/17 Requesting Physician: Dr. Bonna Gains  PATIENT REVIEW QUESTIONS: The patient responded to the following health history questions as indicated:    1. Are you having any GI issues? no 2. Do you have a personal history of Polyps? no 3. Do you have a family history of Colon Cancer or Polyps? no 4. Diabetes Mellitus? no 5. Joint replacements in the past 12 months?yes (Knee Replacement 11/2016) 6. Major health problems in the past 3 months?no 7. Any artificial heart valves, MVP, or defibrillator?no    MEDICATIONS & ALLERGIES:    Patient reports the following regarding taking any anticoagulation/antiplatelet therapy:   Plavix, Coumadin, Eliquis, Xarelto, Lovenox, Pradaxa, Brilinta, or Effient? no Aspirin? no  Patient confirms/reports the following medications:  Current Outpatient Medications  Medication Sig Dispense Refill  . atorvastatin (LIPITOR) 40 MG tablet Take 1 tablet (40 mg total) by mouth at bedtime. 90 tablet 4  . b complex vitamins tablet Take 1 tablet by mouth daily.     . benazepril (LOTENSIN) 20 MG tablet Take 1 tablet (20 mg total) by mouth daily. 90 tablet 4  . diclofenac sodium (VOLTAREN) 1 % GEL Apply 2 g topically 4 (four) times daily. 100 g 3  . gabapentin (NEURONTIN) 800 MG tablet Limited 1-2 tabs by mouth per day if tolerated (Patient taking differently: Take 400-800 mg 2 (two) times daily by mouth. Takes (0.5 tablet) 400 mg in the morning and (1 tablet) 800 mg in the evening) 60 tablet 2  . Oxycodone HCl 10 MG TABS Take 1 tablet (10 mg total) by mouth 3 (three) times daily. 90 tablet 0  . tamsulosin (FLOMAX) 0.4 MG CAPS capsule Take 1 capsule (0.4 mg total) by mouth daily. 30 capsule 3  . Turmeric 500 MG CAPS Take 500 mg daily by mouth. Reported on 05/30/2015     No current facility-administered medications for this visit.     Patient confirms/reports the following allergies:  No Known Allergies  No  orders of the defined types were placed in this encounter.   AUTHORIZATION INFORMATION Primary Insurance: 1D#: Group #:  Secondary Insurance: 1D#: Group #:  SCHEDULE INFORMATION: Date: 03/20/17 Time: Location:[facility]

## 2017-02-18 DIAGNOSIS — M25561 Pain in right knee: Secondary | ICD-10-CM | POA: Diagnosis not present

## 2017-02-18 DIAGNOSIS — Z96651 Presence of right artificial knee joint: Secondary | ICD-10-CM | POA: Diagnosis not present

## 2017-02-20 DIAGNOSIS — Z96651 Presence of right artificial knee joint: Secondary | ICD-10-CM | POA: Diagnosis not present

## 2017-02-20 DIAGNOSIS — M25561 Pain in right knee: Secondary | ICD-10-CM | POA: Diagnosis not present

## 2017-02-22 DIAGNOSIS — Z96651 Presence of right artificial knee joint: Secondary | ICD-10-CM | POA: Diagnosis not present

## 2017-02-25 DIAGNOSIS — Z96651 Presence of right artificial knee joint: Secondary | ICD-10-CM | POA: Diagnosis not present

## 2017-02-25 DIAGNOSIS — M25561 Pain in right knee: Secondary | ICD-10-CM | POA: Diagnosis not present

## 2017-02-27 ENCOUNTER — Ambulatory Visit (INDEPENDENT_AMBULATORY_CARE_PROVIDER_SITE_OTHER): Payer: PPO | Admitting: Family Medicine

## 2017-02-27 ENCOUNTER — Encounter: Payer: Self-pay | Admitting: Family Medicine

## 2017-02-27 VITALS — BP 128/75 | HR 70 | Wt 165.0 lb

## 2017-02-27 DIAGNOSIS — R3912 Poor urinary stream: Secondary | ICD-10-CM

## 2017-02-27 DIAGNOSIS — I10 Essential (primary) hypertension: Secondary | ICD-10-CM

## 2017-02-27 DIAGNOSIS — M25561 Pain in right knee: Secondary | ICD-10-CM | POA: Diagnosis not present

## 2017-02-27 DIAGNOSIS — Z96651 Presence of right artificial knee joint: Secondary | ICD-10-CM

## 2017-02-27 DIAGNOSIS — N401 Enlarged prostate with lower urinary tract symptoms: Secondary | ICD-10-CM | POA: Diagnosis not present

## 2017-02-27 NOTE — Assessment & Plan Note (Signed)
Discussed various care and treatment

## 2017-02-27 NOTE — Assessment & Plan Note (Signed)
The current medical regimen is effective;  continue present plan and medications.  

## 2017-02-27 NOTE — Assessment & Plan Note (Signed)
Discussed need for specialized PSA testing patient will inquire about finding the specialized testing equipment and we will draw if appropriate.

## 2017-02-27 NOTE — Progress Notes (Signed)
BP 128/75   Pulse 70   Wt 165 lb (74.8 kg)   SpO2 95%   BMI 25.84 kg/m    Subjective:    Patient ID: Timothy Knapp, male    DOB: [birth year], 77 y.o.   MRN: [id number]  HPI: Timothy Knapp is a 77 y.o. male  Chief Complaint  Patient presents with  . Follow-up  . Elevated PSA    The use of newer adjunctive blood tests including PHI and 4Kscore were discussed. The use of multiparametric prostate MRI was also discussed however is not typically used for initial evaluation of an elevated PSA. Continued periodic surveillance was also discussed.  On review with patient also with urology office specialized 4 K PSA testing requires specialized tubes and mailed to a specialized lab.  We are not set up for this type of testing.  Patient is able to acquire proper tubes and addresses we will be happy to draw  this lab for the patient.  Reviewed BPH had initially great results with Flomax not as good now patient will review again with urology to see if can find out any further details. Patient's knee is a real problem with developing scar tissue asking about specialized laser treatment may be available through chiropractic discussed that would be okay to do but not sure where or who. Blood pressure doing okay with benazepril no issues or concerns.   Relevant past medical, surgical, family and social history reviewed and updated as indicated. Interim medical history since our last visit reviewed. Allergies and medications reviewed and updated.  Review of Systems  Constitutional: Negative.   Respiratory: Negative.   Cardiovascular: Negative.     Per HPI unless specifically indicated above     Objective:    BP 128/75   Pulse 70   Wt 165 lb (74.8 kg)   SpO2 95%   BMI 25.84 kg/m   Wt Readings from Last 3 Encounters:  02/27/17 165 lb (74.8 kg)  02/13/17 164 lb (74.4 kg)  01/28/17 164 lb (74.4 kg)    Physical Exam  Constitutional: He is oriented to person, place, and time. He appears  well-developed and well-nourished.  HENT:  Head: Normocephalic and atraumatic.  Eyes: Conjunctivae and EOM are normal.  Neck: Normal range of motion.  Cardiovascular: Normal rate, regular rhythm and normal heart sounds.  Pulmonary/Chest: Effort normal and breath sounds normal.  Musculoskeletal: Normal range of motion.  Neurological: He is alert and oriented to person, place, and time.  Skin: No erythema.  Psychiatric: He has a normal mood and affect. His behavior is normal. Judgment and thought content normal.    Results for orders placed or performed in visit on 01/28/17  Comprehensive metabolic panel  Result Value Ref Range   Glucose 103 (H) 65 - 99 mg/dL   BUN 18 8 - 27 mg/dL   Creatinine, Ser 1.00 0.76 - 1.27 mg/dL   GFR calc non Af Amer 72 >59 mL/min/1.73   GFR calc Af Amer 84 >59 mL/min/1.73   BUN/Creatinine Ratio 18 10 - 24   Sodium 141 134 - 144 mmol/L   Potassium 4.9 3.5 - 5.2 mmol/L   Chloride 100 96 - 106 mmol/L   CO2 25 20 - 29 mmol/L   Calcium 9.3 8.6 - 10.2 mg/dL   Total Protein 6.9 6.0 - 8.5 g/dL   Albumin 4.3 3.5 - 4.8 g/dL   Globulin, Total 2.6 1.5 - 4.5 g/dL   Albumin/Globulin Ratio 1.7 1.2 - 2.2  Bilirubin Total 0.4 0.0 - 1.2 mg/dL   Alkaline Phosphatase 68 39 - 117 IU/L   AST 29 0 - 40 IU/L   ALT 18 0 - 44 IU/L  Lipid panel  Result Value Ref Range   Cholesterol, Total 185 100 - 199 mg/dL   Triglycerides 174 (H) 0 - 149 mg/dL   HDL 44 >39 mg/dL   VLDL Cholesterol Cal 35 5 - 40 mg/dL   LDL Calculated 106 (H) 0 - 99 mg/dL   Chol/HDL Ratio 4.2 0.0 - 5.0 ratio  CBC with Differential/Platelet  Result Value Ref Range   WBC 6.4 3.4 - 10.8 x10E3/uL   RBC 4.64 4.14 - 5.80 x10E6/uL   Hemoglobin 13.4 13.0 - 17.7 g/dL   Hematocrit 40.8 37.5 - 51.0 %   MCV 88 79 - 97 fL   MCH 28.9 26.6 - 33.0 pg   MCHC 32.8 31.5 - 35.7 g/dL   RDW 14.0 12.3 - 15.4 %   Platelets 214 150 - 379 x10E3/uL   Neutrophils 54 Not Estab. %   Lymphs 29 Not Estab. %   Monocytes 11  Not Estab. %   Eos 5 Not Estab. %   Basos 1 Not Estab. %   Neutrophils Absolute 3.5 1.4 - 7.0 x10E3/uL   Lymphocytes Absolute 1.9 0.7 - 3.1 x10E3/uL   Monocytes Absolute 0.7 0.1 - 0.9 x10E3/uL   EOS (ABSOLUTE) 0.3 0.0 - 0.4 x10E3/uL   Basophils Absolute 0.1 0.0 - 0.2 x10E3/uL   Immature Granulocytes 0 Not Estab. %   Immature Grans (Abs) 0.0 0.0 - 0.1 x10E3/uL  Urinalysis, Routine w reflex microscopic  Result Value Ref Range   Specific Gravity, UA 1.030 1.005 - 1.030   pH, UA 5.5 5.0 - 7.5   Color, UA Yellow Yellow   Appearance Ur Clear Clear   Leukocytes, UA Negative Negative   Protein, UA Negative Negative/Trace   Glucose, UA Negative Negative   Ketones, UA Trace (A) Negative   RBC, UA Negative Negative   Bilirubin, UA Negative Negative   Urobilinogen, Ur 0.2 0.2 - 1.0 mg/dL   Nitrite, UA Negative Negative  PSA  Result Value Ref Range   Prostate Specific Ag, Serum 5.2 (H) 0.0 - 4.0 ng/mL      Assessment & Plan:   Problem List Items Addressed This Visit      Cardiovascular and Mediastinum   Hypertension    The current medical regimen is effective;  continue present plan and medications.         Genitourinary   Benign prostatic hyperplasia with lower urinary tract symptoms - Primary    Discussed need for specialized PSA testing patient will inquire about finding the specialized testing equipment and we will draw if appropriate.        Other   S/P total knee arthroplasty    Discussed various care and treatment          Follow up plan: Return if symptoms worsen or fail to improve, for As scheduled.

## 2017-02-28 DIAGNOSIS — H2513 Age-related nuclear cataract, bilateral: Secondary | ICD-10-CM | POA: Diagnosis not present

## 2017-03-01 DIAGNOSIS — Z96651 Presence of right artificial knee joint: Secondary | ICD-10-CM | POA: Diagnosis not present

## 2017-03-04 DIAGNOSIS — M25561 Pain in right knee: Secondary | ICD-10-CM | POA: Diagnosis not present

## 2017-03-04 DIAGNOSIS — Z96651 Presence of right artificial knee joint: Secondary | ICD-10-CM | POA: Diagnosis not present

## 2017-03-11 DIAGNOSIS — Z96651 Presence of right artificial knee joint: Secondary | ICD-10-CM | POA: Diagnosis not present

## 2017-03-11 DIAGNOSIS — M25561 Pain in right knee: Secondary | ICD-10-CM | POA: Diagnosis not present

## 2017-03-13 DIAGNOSIS — Z96651 Presence of right artificial knee joint: Secondary | ICD-10-CM | POA: Diagnosis not present

## 2017-03-13 DIAGNOSIS — M25561 Pain in right knee: Secondary | ICD-10-CM | POA: Diagnosis not present

## 2017-03-15 ENCOUNTER — Encounter: Payer: Self-pay | Admitting: Anesthesiology

## 2017-03-15 ENCOUNTER — Ambulatory Visit: Payer: PPO | Attending: Anesthesiology | Admitting: Anesthesiology

## 2017-03-15 ENCOUNTER — Other Ambulatory Visit: Payer: Self-pay

## 2017-03-15 VITALS — BP 132/69 | HR 73 | Temp 97.7°F | Resp 16 | Ht 68.0 in | Wt 165.0 lb

## 2017-03-15 DIAGNOSIS — M5134 Other intervertebral disc degeneration, thoracic region: Secondary | ICD-10-CM | POA: Diagnosis not present

## 2017-03-15 DIAGNOSIS — Z79899 Other long term (current) drug therapy: Secondary | ICD-10-CM | POA: Diagnosis not present

## 2017-03-15 DIAGNOSIS — B0229 Other postherpetic nervous system involvement: Secondary | ICD-10-CM | POA: Insufficient documentation

## 2017-03-15 DIAGNOSIS — F1190 Opioid use, unspecified, uncomplicated: Secondary | ICD-10-CM | POA: Diagnosis not present

## 2017-03-15 DIAGNOSIS — G8929 Other chronic pain: Secondary | ICD-10-CM | POA: Diagnosis not present

## 2017-03-15 DIAGNOSIS — M25561 Pain in right knee: Secondary | ICD-10-CM | POA: Diagnosis not present

## 2017-03-15 DIAGNOSIS — M546 Pain in thoracic spine: Secondary | ICD-10-CM | POA: Diagnosis not present

## 2017-03-15 DIAGNOSIS — Z96651 Presence of right artificial knee joint: Secondary | ICD-10-CM | POA: Diagnosis not present

## 2017-03-15 MED ORDER — OXYCODONE HCL 10 MG PO TABS: 10.0000 mg | ORAL_TABLET | Freq: Three times a day (TID) | ORAL | 0 refills | Status: DC

## 2017-03-15 NOTE — Progress Notes (Signed)
Subjective:  Patient ID: Timothy Knapp, male    DOB: 1940/04/11  Age: 77 y.o. MRN: [id number]  CC: Back Pain (low right)   Procedure: None  HPI EZELL POKE presents for reevaluation.  He was last seen a few months ago and since that time his continue to have troubles with his right knee following his recent total knee arthroplasty.  He is doing reasonably well with the pain but the physical therapy with range of motion exercises is causing more persistent problem then he had previously thought.  Fortunately, he has been able to wean back his opioid medications to averaging 2 times a day to 3 times a day.  Based on his narcotic assessment sheet he continues to derive good functional lifestyle improvement with the medications and better sleep.  This is also helping with his physical therapy recovery.  He is using occasional ibuprofen once a day for anti-inflammatory as well.  The quality characteristic and distribution of his pain complex is otherwise stable in nature.  Outpatient Medications Prior to Visit  Medication Sig Dispense Refill  . atorvastatin (LIPITOR) 40 MG tablet Take 1 tablet (40 mg total) by mouth at bedtime. 90 tablet 4  . b complex vitamins tablet Take 1 tablet by mouth daily.     . benazepril (LOTENSIN) 20 MG tablet Take 1 tablet (20 mg total) by mouth daily. 90 tablet 4  . diclofenac sodium (VOLTAREN) 1 % GEL Apply 2 g topically 4 (four) times daily. 100 g 3  . gabapentin (NEURONTIN) 800 MG tablet Limited 1-2 tabs by mouth per day if tolerated (Patient taking differently: Take 400-800 mg 2 (two) times daily by mouth. Takes (0.5 tablet) 400 mg in the morning and (1 tablet) 800 mg in the evening) 60 tablet 2  . NON FORMULARY Place under the tongue daily. 5 drops daily    . tamsulosin (FLOMAX) 0.4 MG CAPS capsule Take 1 capsule (0.4 mg total) by mouth daily. 30 capsule 3  . Turmeric 500 MG CAPS Take 500 mg daily by mouth. Reported on 05/30/2015    . Oxycodone HCl 10 MG TABS  Take 1 tablet (10 mg total) by mouth 3 (three) times daily. 90 tablet 0   No facility-administered medications prior to visit.     Review of Systems CNS: No confusion or sedation Cardiac: No angina or palpitations GI: No abdominal pain or constipation Constitutional: No nausea vomiting fevers or chills  Objective:  BP 132/69   Pulse 73   Temp 97.7 F (36.5 C)   Resp 16   Ht 5\' 8"  (1.727 m)   Wt 165 lb (74.8 kg)   SpO2 95%   BMI 25.09 kg/m    BP Readings from Last 3 Encounters:  03/15/17 132/69  02/27/17 128/75  02/13/17 (!) 142/72     Wt Readings from Last 3 Encounters:  03/15/17 165 lb (74.8 kg)  02/27/17 165 lb (74.8 kg)  02/13/17 164 lb (74.4 kg)     Physical Exam Pt is alert and oriented PERRL EOMI HEART IS RRR no murmur or rub LCTA no wheezing or rales MUSCULOSKELETAL reveals some persistent right knee pain and low back pain as reviewed today.  His muscle tone and bulk in the lower extremities appears to be at baseline and he walks with a mildly antalgic gait.  Labs  No results found for: HGBA1C Lab Results  Component Value Date   LDLCALC 106 (H) 01/28/2017   CREATININE 1.00 01/28/2017    --------------------------------------------------------------------------------------------------------------------  Lab Results  Component Value Date   WBC 6.4 01/28/2017   HGB 13.4 01/28/2017   HCT 40.8 01/28/2017   PLT 214 01/28/2017   GLUCOSE 103 (H) 01/28/2017   CHOL 185 01/28/2017   TRIG 174 (H) 01/28/2017   HDL 44 01/28/2017   LDLCALC 106 (H) 01/28/2017   ALT 18 01/28/2017   AST 29 01/28/2017   NA 141 01/28/2017   K 4.9 01/28/2017   CL 100 01/28/2017   CREATININE 1.00 01/28/2017   BUN 18 01/28/2017   CO2 25 01/28/2017   TSH 2.490 12/05/2015   INR 0.95 11/28/2016    --------------------------------------------------------------------------------------------------------------------- No results found.   Assessment & Plan:   There are no  diagnoses linked to this encounter.    #1.  Postherpetic neuralgia.  Continue current medication management with efforts at weaning chronic opioid therapy as tolerated.  We will give repeat refill medications for March 8 and April 7.  The prescription for March 8 will be for 90 tablets and April 7 for 75.  We have talked about continued efforts and benefits of weaning if tolerated.  2.  Degenerative disc disease continue with back stretching strengthening efforts.  3.  Chronic thoracic pain.  Continue with stretching strengthening and medication management as tolerated with return to clinic in 2 months.  We have also reviewed the [facility] practitioner database information and it is appropriate for the opioid refills.  4.  Continue physical therapy and follow-up with Dr. Marry Guan following total knee arthroplasty.  ----------------------------------------------------------------------------------------------------------------------  Problem List Items Addressed This Visit    None        ----------------------------------------------------------------------------------------------------------------------  There are no diagnoses linked to this encounter.   ----------------------------------------------------------------------------------------------------------------------  I am having [name] "Ed" maintain his b complex vitamins, Turmeric, gabapentin, atorvastatin, benazepril, diclofenac sodium, tamsulosin, NON FORMULARY, and Oxycodone HCl.   Meds ordered this encounter  Medications  . DISCONTD: Oxycodone HCl 10 MG TABS    Sig: Take 1 tablet (10 mg total) by mouth 3 (three) times daily.    Dispense:  90 tablet    Refill:  0    Do not fill until [id number]  . Oxycodone HCl 10 MG TABS    Sig: Take 1 tablet (10 mg total) by mouth 3 (three) times daily.    Dispense:  75 tablet    Refill:  0    Do not fill until [id number]   Patient's Medications  New Prescriptions   No  medications on file  Previous Medications   ATORVASTATIN (LIPITOR) 40 MG TABLET    Take 1 tablet (40 mg total) by mouth at bedtime.   B COMPLEX VITAMINS TABLET    Take 1 tablet by mouth daily.    BENAZEPRIL (LOTENSIN) 20 MG TABLET    Take 1 tablet (20 mg total) by mouth daily.   DICLOFENAC SODIUM (VOLTAREN) 1 % GEL    Apply 2 g topically 4 (four) times daily.   GABAPENTIN (NEURONTIN) 800 MG TABLET    Limited 1-2 tabs by mouth per day if tolerated   NON FORMULARY    Place under the tongue daily. 5 drops daily   TAMSULOSIN (FLOMAX) 0.4 MG CAPS CAPSULE    Take 1 capsule (0.4 mg total) by mouth daily.   TURMERIC 500 MG CAPS    Take 500 mg daily by mouth. Reported on 05/30/2015  Modified Medications   Modified Medication Previous Medication   OXYCODONE HCL 10 MG TABS Oxycodone HCl 10 MG TABS  Take 1 tablet (10 mg total) by mouth 3 (three) times daily.    Take 1 tablet (10 mg total) by mouth 3 (three) times daily.  Discontinued Medications   No medications on file   ----------------------------------------------------------------------------------------------------------------------  Follow-up: Return in about 2 months (around 05/15/2017) for evaluation, med refill.    [name], MD

## 2017-03-15 NOTE — Progress Notes (Signed)
Nursing Pain Medication Assessment:  Safety precautions to be maintained throughout the outpatient stay will include: orient to surroundings, keep bed in low position, maintain call bell within reach at all times, provide assistance with transfer out of bed and ambulation.  Medication Inspection Compliance: Pill count conducted under aseptic conditions, in front of the patient. Neither the pills nor the bottle was removed from the patient's sight at any time. Once count was completed pills were immediately returned to the patient in their original bottle.  Medication: Oxycodone IR Pill/Patch Count: 54 of 90 pills remain Pill/Patch Appearance: Markings consistent with prescribed medication Bottle Appearance: Standard pharmacy container. Clearly labeled. Filled Date:02/ 06 / 2018 Last Medication intake:  Today

## 2017-03-15 NOTE — Patient Instructions (Signed)
You have been given 2 scripts for oxycodone  Today.

## 2017-03-18 DIAGNOSIS — Z96651 Presence of right artificial knee joint: Secondary | ICD-10-CM | POA: Diagnosis not present

## 2017-03-18 DIAGNOSIS — M25561 Pain in right knee: Secondary | ICD-10-CM | POA: Diagnosis not present

## 2017-03-19 DIAGNOSIS — Z471 Aftercare following joint replacement surgery: Secondary | ICD-10-CM | POA: Diagnosis not present

## 2017-03-20 ENCOUNTER — Ambulatory Visit: Payer: PPO | Admitting: Certified Registered"

## 2017-03-20 ENCOUNTER — Encounter: Payer: Self-pay | Admitting: *Deleted

## 2017-03-20 ENCOUNTER — Ambulatory Visit
Admission: RE | Admit: 2017-03-20 | Discharge: 2017-03-20 | Disposition: A | Discharge status: Home / Self Care | Payer: PPO | Source: Ambulatory Visit | Attending: Gastroenterology | Admitting: Gastroenterology

## 2017-03-20 ENCOUNTER — Other Ambulatory Visit: Payer: Self-pay

## 2017-03-20 ENCOUNTER — Encounter
Admission: RE | Disposition: A | Discharge status: Home / Self Care | Payer: Self-pay | Source: Ambulatory Visit | Attending: Gastroenterology

## 2017-03-20 DIAGNOSIS — I10 Essential (primary) hypertension: Secondary | ICD-10-CM | POA: Insufficient documentation

## 2017-03-20 DIAGNOSIS — K5790 Diverticulosis of intestine, part unspecified, without perforation or abscess without bleeding: Secondary | ICD-10-CM | POA: Diagnosis not present

## 2017-03-20 DIAGNOSIS — Z96651 Presence of right artificial knee joint: Secondary | ICD-10-CM | POA: Insufficient documentation

## 2017-03-20 DIAGNOSIS — D122 Benign neoplasm of ascending colon: Secondary | ICD-10-CM | POA: Diagnosis not present

## 2017-03-20 DIAGNOSIS — Z85828 Personal history of other malignant neoplasm of skin: Secondary | ICD-10-CM | POA: Diagnosis not present

## 2017-03-20 DIAGNOSIS — Z1211 Encounter for screening for malignant neoplasm of colon: Secondary | ICD-10-CM | POA: Insufficient documentation

## 2017-03-20 DIAGNOSIS — Z79899 Other long term (current) drug therapy: Secondary | ICD-10-CM | POA: Insufficient documentation

## 2017-03-20 DIAGNOSIS — K635 Polyp of colon: Secondary | ICD-10-CM | POA: Diagnosis not present

## 2017-03-20 DIAGNOSIS — K5730 Diverticulosis of large intestine without perforation or abscess without bleeding: Secondary | ICD-10-CM | POA: Diagnosis not present

## 2017-03-20 DIAGNOSIS — N400 Enlarged prostate without lower urinary tract symptoms: Secondary | ICD-10-CM | POA: Diagnosis not present

## 2017-03-20 DIAGNOSIS — E785 Hyperlipidemia, unspecified: Secondary | ICD-10-CM | POA: Diagnosis not present

## 2017-03-20 HISTORY — PX: COLONOSCOPY WITH PROPOFOL: SHX5780

## 2017-03-20 SURGERY — COLONOSCOPY WITH PROPOFOL
Anesthesia: General

## 2017-03-20 MED ORDER — SODIUM CHLORIDE 0.9 % IV SOLN
INTRAVENOUS | Status: DC
  Administered 2017-03-20 (×2): via INTRAVENOUS

## 2017-03-20 MED ORDER — PROPOFOL 10 MG/ML IV BOLUS
INTRAVENOUS | Status: AC
  Filled 2017-03-20: qty 20

## 2017-03-20 MED ORDER — LIDOCAINE HCL (PF) 2 % IJ SOLN
INTRAMUSCULAR | Status: AC
  Filled 2017-03-20: qty 10

## 2017-03-20 MED ORDER — PHENYLEPHRINE HCL 10 MG/ML IJ SOLN
INTRAMUSCULAR | Status: DC | PRN
  Administered 2017-03-20: 200 ug via INTRAVENOUS
  Administered 2017-03-20: 100 ug via INTRAVENOUS
  Administered 2017-03-20 (×3): 200 ug via INTRAVENOUS
  Administered 2017-03-20: 100 ug via INTRAVENOUS

## 2017-03-20 MED ORDER — LIDOCAINE HCL (CARDIAC) 20 MG/ML IV SOLN
INTRAVENOUS | Status: DC | PRN
  Administered 2017-03-20: 50 mg via INTRAVENOUS

## 2017-03-20 MED ORDER — PHENYLEPHRINE HCL 10 MG/ML IJ SOLN
INTRAMUSCULAR | Status: AC
  Filled 2017-03-20: qty 1

## 2017-03-20 MED ORDER — PROPOFOL 500 MG/50ML IV EMUL
INTRAVENOUS | Status: AC
  Filled 2017-03-20: qty 50

## 2017-03-20 MED ORDER — PROPOFOL 500 MG/50ML IV EMUL
INTRAVENOUS | Status: DC | PRN
  Administered 2017-03-20: 200 ug/kg/min via INTRAVENOUS

## 2017-03-20 NOTE — H&P (Signed)
Timothy Antigua, MD [address] [place], [place], [place], [address] [address], [place], [place], [place], [address] Phone: [phone]  Fax: [phone]  Primary Care Physician:  Guadalupe Maple, MD   Pre-Procedure History & Physical: HPI:  Timothy Knapp is a 77 y.o. male is here for a colonoscopy.   Past Medical History:  Diagnosis Date  . Arthritis   . BPH (benign prostatic hypertrophy)   . Hyperlipidemia   . Hypertension     Past Surgical History:  Procedure Laterality Date  . JOINT REPLACEMENT    . KNEE ARTHROPLASTY Right 12/10/2016   Procedure: COMPUTER ASSISTED TOTAL KNEE ARTHROPLASTY;  Surgeon: Dereck Leep, MD;  Location: [facility] ORS;  Service: Orthopedics;  Laterality: Right;  . PROSTATE SURGERY N/A   . SKIN CANCER EXCISION  11/09/2015    Prior to Admission medications   Medication Sig Start Date End Date Taking? Authorizing Provider  atorvastatin (LIPITOR) 40 MG tablet Take 1 tablet (40 mg total) by mouth at bedtime. 01/28/17  Yes Crissman, Jeannette How, MD  b complex vitamins tablet Take 1 tablet by mouth daily.    Yes [provider]  benazepril (LOTENSIN) 20 MG tablet Take 1 tablet (20 mg total) by mouth daily. 01/28/17  Yes Crissman, Jeannette How, MD  diclofenac sodium (VOLTAREN) 1 % GEL Apply 2 g topically 4 (four) times daily. 01/28/17  Yes Crissman, Jeannette How, MD  gabapentin (NEURONTIN) 800 MG tablet Limited 1-2 tabs by mouth per day if tolerated Patient taking differently: Take 400-800 mg 2 (two) times daily by mouth. Takes (0.5 tablet) 400 mg in the morning and (1 tablet) 800 mg in the evening 09/19/15  Yes Mohammed Kindle, MD  NON FORMULARY Place under the tongue daily. 5 drops daily   Yes [provider]  Oxycodone HCl 10 MG TABS Take 1 tablet (10 mg total) by mouth 3 (three) times daily. 03/15/17  Yes [name], MD  tamsulosin (FLOMAX) 0.4 MG CAPS capsule Take 1 capsule (0.4 mg total) by mouth daily. 01/28/17  Yes Crissman, Jeannette How, MD    Turmeric 500 MG CAPS Take 500 mg daily by mouth. Reported on 05/30/2015   Yes [provider]    Allergies as of 02/15/2017  . (No Known Allergies)    Family History  Problem Relation Age of Onset  . Heart disease Mother   . Prostate cancer Neg Hx   . Bladder Cancer Neg Hx   . Kidney cancer Neg Hx     Social History   Socioeconomic History  . Marital status: Married    Spouse name: Not on file  . Number of children: Not on file  . Years of education: Not on file  . Highest education level: Not on file  Social Needs  . Financial resource strain: Not hard at all  . Food insecurity - worry: Patient refused  . Food insecurity - inability: Patient refused  . Transportation needs - medical: Patient refused  . Transportation needs - non-medical: Patient refused  Occupational History  . Not on file  Tobacco Use  . Smoking status: Never Smoker  . Smokeless tobacco: Never Used  Substance and Sexual Activity  . Alcohol use: No    Alcohol/week: 0.0 oz    Frequency: Never  . Drug use: No  . Sexual activity: Not on file  Other Topics Concern  . Not on file  Social History Narrative  . Not on file    Review of Systems:  See HPI, otherwise negative ROS  Physical Exam: BP 124/64   Pulse 99   Temp (!) 96.9 F (36.1 C) (Tympanic)   Resp 16   Ht 5\' 6"  (1.676 m)   Wt 164 lb (74.4 kg)   SpO2 95%   BMI 26.47 kg/m  General:   Alert,  pleasant and cooperative in NAD Head:  Normocephalic and atraumatic. Neck:  Supple; no masses or thyromegaly. Lungs:  Clear throughout to auscultation, normal respiratory effort.    Heart:  +S1, +S2, Regular rate and rhythm, No edema. Abdomen:  Soft, nontender and nondistended. Normal bowel sounds, without guarding, and without rebound.   Neurologic:  Alert and  oriented x4;  grossly normal neurologically.  Impression/Plan: Timothy Knapp is here for a colonoscopy to be performed for average risk screening.  Risks, benefits,  limitations, and alternatives regarding  colonoscopy have been reviewed with the patient.  Questions have been answered.  All parties agreeable.   Virgel Manifold, MD  03/20/2017, 8:05 AM

## 2017-03-20 NOTE — Anesthesia Post-op Follow-up Note (Signed)
Anesthesia QCDR form completed.        

## 2017-03-20 NOTE — Transfer of Care (Signed)
Immediate Anesthesia Transfer of Care Note  Patient: Timothy Knapp  Procedure(s) Performed: COLONOSCOPY WITH PROPOFOL (N/A )  Patient Location: PACU  Anesthesia Type:General  Level of Consciousness: drowsy and patient cooperative  Airway & Oxygen Therapy: Patient Spontanous Breathing  Post-op Assessment: Report given to RN, Post -op Vital signs reviewed and stable and Patient moving all extremities X 4  Post vital signs: Reviewed and stable  Last Vitals:  Vitals:   03/20/17 0728 03/20/17 0906  BP: 124/64 111/65  Pulse: 99   Resp: 16 19  Temp: (!) 36.1 C (!) 35.9 C  SpO2: 95% 95%    Last Pain:  Vitals:   03/20/17 0906  TempSrc: Tympanic         Complications: No apparent anesthesia complications

## 2017-03-20 NOTE — Anesthesia Postprocedure Evaluation (Signed)
Anesthesia Post Note  Patient: Timothy Knapp  Procedure(s) Performed: COLONOSCOPY WITH PROPOFOL (N/A )  Patient location during evaluation: Endoscopy Anesthesia Type: General Level of consciousness: awake and alert, oriented and patient cooperative Pain management: pain level controlled Vital Signs Assessment: post-procedure vital signs reviewed and stable Respiratory status: spontaneous breathing and respiratory function stable Cardiovascular status: stable and blood pressure returned to baseline Postop Assessment: no headache, no backache, patient able to bend at knees, no apparent nausea or vomiting and adequate PO intake Anesthetic complications: no     Last Vitals:  Vitals:   03/20/17 0728 03/20/17 0906  BP: 124/64 111/65  Pulse: 99   Resp: 16 19  Temp: (!) 36.1 C (!) 35.9 C  SpO2: 95% 95%    Last Pain:  Vitals:   03/20/17 0906  TempSrc: Tympanic                 [name]

## 2017-03-20 NOTE — Anesthesia Preprocedure Evaluation (Signed)
Anesthesia Evaluation  Patient identified by MRN, date of birth, ID band Patient awake    Reviewed: Allergy & Precautions, H&P , NPO status , Patient's Chart, lab work & pertinent test results  History of Anesthesia Complications Negative for: history of anesthetic complications  Airway Mallampati: III  TM Distance: >3 FB Neck ROM: limited    Dental  (+) Chipped, Poor Dentition   Pulmonary neg pulmonary ROS, neg shortness of breath,           Cardiovascular Exercise Tolerance: Good hypertension, (-) angina(-) Past MI and (-) DOE      Neuro/Psych  Neuromuscular disease negative psych ROS   GI/Hepatic negative GI ROS, Neg liver ROS, neg GERD  ,  Endo/Other  negative endocrine ROS  Renal/GU negative Renal ROS  negative genitourinary   Musculoskeletal  (+) Arthritis ,   Abdominal   Peds  Hematology negative hematology ROS (+)   Anesthesia Other Findings Past Medical History: No date: Arthritis No date: BPH (benign prostatic hypertrophy) No date: Hyperlipidemia No date: Hypertension  Past Surgical History: No date: JOINT REPLACEMENT 12/10/2016: KNEE ARTHROPLASTY; Right     Comment:  Procedure: COMPUTER ASSISTED TOTAL KNEE ARTHROPLASTY;                Surgeon: Dereck Leep, MD;  Location: [facility] ORS;                Service: Orthopedics;  Laterality: Right; No date: PROSTATE SURGERY; N/A 11/09/2015: SKIN CANCER EXCISION     Reproductive/Obstetrics negative OB ROS                             Anesthesia Physical Anesthesia Plan  ASA: II  Anesthesia Plan: General   Post-op Pain Management:    Induction: Intravenous  PONV Risk Score and Plan: Propofol infusion and TIVA  Airway Management Planned: Natural Airway and Nasal Cannula  Additional Equipment:   Intra-op Plan:   Post-operative Plan:   Informed Consent: I have reviewed the patients History and Physical, chart, labs  and discussed the procedure including the risks, benefits and alternatives for the proposed anesthesia with the patient or authorized representative who has indicated his/her understanding and acceptance.   Dental Advisory Given  Plan Discussed with: Anesthesiologist, CRNA and Surgeon  Anesthesia Plan Comments: (Patient consented for risks of anesthesia including but not limited to:  - adverse reactions to medications - risk of intubation if required - damage to teeth, lips or other oral mucosa - sore throat or hoarseness - Damage to heart, brain, lungs or loss of life  Patient voiced understanding.)        Anesthesia Quick Evaluation

## 2017-03-20 NOTE — Op Note (Signed)
[facility] Gastroenterology Patient Name: Timothy Knapp Procedure Date: 03/20/2017 7:51 AM MRN: [id number] Account #: [id number] Date of Birth: 02/11/1940 Admit Type: Outpatient Age: 77 Room: [facility] ENDO ROOM 2 Gender: Male Note Status: Finalized Procedure:            Colonoscopy Indications:          Screening for colorectal malignant neoplasm, Pt states                        he had 2 colonoscopies in the past, both 10-11 yrs                        apart and none of them found polyps. Last one was 10                        yrs ago. No family history of colon cancer. Previous                        records of colonoscopies not available. Providers:            Lennette Bihari. Bonna Gains MD, MD Referring MD:         Guadalupe Maple, MD (Referring MD) Medicines:            Monitored Anesthesia Care Complications:        No immediate complications. Procedure:            Pre-Anesthesia Assessment:                       - ASA Grade Assessment: II - A patient with mild                        systemic disease.                       - Prior to the procedure, a History and Physical was                        performed, and patient medications, allergies and                        sensitivities were reviewed. The patient's tolerance of                        previous anesthesia was reviewed.                       - The risks and benefits of the procedure and the                        sedation options and risks were discussed with the                        patient. All questions were answered and informed                        consent was obtained.                       - Patient identification and proposed procedure were  verified prior to the procedure by the physician, the                        nurse, the anesthesiologist, the anesthetist and the                        technician. The procedure was verified in the procedure                         room.                       After obtaining informed consent, the colonoscope was                        passed under direct vision. Throughout the procedure,                        the patient's blood pressure, pulse, and oxygen                        saturations were monitored continuously. The                        Colonoscope was introduced through the anus and                        advanced to the the cecum, identified by appendiceal                        orifice and ileocecal valve. The colonoscopy was                        performed with ease. The patient tolerated the                        procedure well. The quality of the bowel preparation                        was good. Findings:      The perianal and digital rectal examinations were normal.      Three sessile polyps were found in the ascending colon. The polyps were       5 to 7 mm in size. These polyps were removed with a cold snare.       Resection and retrieval were complete.      A 2 mm polyp was found in the ascending colon. The polyp was sessile.       The polyp was removed with a cold biopsy forceps. Resection and       retrieval were complete. It was placed in the same jar as the other       ascending colon polyps.      Multiple diverticula were found in the sigmoid colon.      The exam was otherwise without abnormality.      The rectum, sigmoid colon, descending colon, transverse colon, ascending       colon and cecum appeared normal.      Retroflexion could not be performed due to a narrow rectum. Careful       frontal view of the rectum did not reveal any abnormalities. Impression:           -  Three 5 to 7 mm polyps in the ascending colon,                        removed with a cold snare. Resected and retrieved.                       - One 2 mm polyp in the ascending colon, removed with a                        cold biopsy forceps. Resected and retrieved.                       - Diverticulosis in the sigmoid  colon.                       - The examination was otherwise normal.                       - The rectum, sigmoid colon, descending colon,                        transverse colon, ascending colon and cecum are normal. Recommendation:       - Discharge patient to home (with escort).                       - High fiber diet.                       - Advance diet as tolerated.                       - Continue present medications.                       - Await pathology results.                       - Repeat colonoscopy in 3 years for surveillance.                       - The findings and recommendations were discussed with                        the patient.                       - The findings and recommendations were discussed with                        the patient's family.                       - Return to primary care physician as previously                        scheduled. Procedure Code(s):    --- Professional ---                       [phone], Colonoscopy, flexible; with removal of tumor(s),                        polyp(s), or other lesion(s) by  snare technique                       45380, 59, Colonoscopy, flexible; with biopsy, single                        or multiple Diagnosis Code(s):    --- Professional ---                       D12.2, Benign neoplasm of ascending colon                       Z12.11, Encounter for screening for malignant neoplasm                        of colon                       K57.30, Diverticulosis of large intestine without                        perforation or abscess without bleeding CPT copyright 2016 American Medical Association. All rights reserved. The codes documented in this report are preliminary and upon coder review may  be revised to meet current compliance requirements.  Vonda Antigua, MD Margretta Sidle B. Bonna Gains MD, MD 03/20/2017 9:10:55 AM This report has been signed electronically. Number of Addenda: 0 Note Initiated On: 03/20/2017 7:51  AM Scope Withdrawal Time: 0 hours 30 minutes 1 second  Total Procedure Duration: 0 hours 42 minutes 44 seconds  Estimated Blood Loss: Estimated blood loss: none.      [facility]

## 2017-03-21 ENCOUNTER — Encounter: Payer: Self-pay | Admitting: Gastroenterology

## 2017-03-21 DIAGNOSIS — Z96651 Presence of right artificial knee joint: Secondary | ICD-10-CM | POA: Diagnosis not present

## 2017-03-21 DIAGNOSIS — M25561 Pain in right knee: Secondary | ICD-10-CM | POA: Diagnosis not present

## 2017-03-21 LAB — SURGICAL PATHOLOGY

## 2017-03-25 ENCOUNTER — Telehealth: Payer: Self-pay | Admitting: Family Medicine

## 2017-03-25 MED ORDER — BENZONATATE 100 MG PO CAPS: 100.0000 mg | ORAL_CAPSULE | Freq: Two times a day (BID) | ORAL | 1 refills | Status: DC | PRN

## 2017-03-25 NOTE — Telephone Encounter (Signed)
Copied from [place]. Topic: Quick Communication - See Telephone Encounter >> Mar 25, 2017  9:41 AM Aurelio Brash B wrote: CRM for notification. See Telephone encounter for:  PT is requesting medication for a cold be sent to [facility], [place] [phone] (Phone) [phone] (Fax)       03/25/17.

## 2017-03-28 DIAGNOSIS — Z96651 Presence of right artificial knee joint: Secondary | ICD-10-CM | POA: Diagnosis not present

## 2017-03-28 DIAGNOSIS — M25561 Pain in right knee: Secondary | ICD-10-CM | POA: Diagnosis not present

## 2017-03-29 ENCOUNTER — Other Ambulatory Visit: Payer: Self-pay

## 2017-03-29 ENCOUNTER — Emergency Department
Admission: EM | Admit: 2017-03-29 | Discharge: 2017-03-29 | Disposition: A | Discharge status: Home / Self Care | Payer: PPO | Attending: Emergency Medicine | Admitting: Emergency Medicine

## 2017-03-29 ENCOUNTER — Encounter: Payer: Self-pay | Admitting: Emergency Medicine

## 2017-03-29 ENCOUNTER — Emergency Department: Payer: PPO

## 2017-03-29 DIAGNOSIS — Z96651 Presence of right artificial knee joint: Secondary | ICD-10-CM | POA: Insufficient documentation

## 2017-03-29 DIAGNOSIS — R319 Hematuria, unspecified: Secondary | ICD-10-CM

## 2017-03-29 DIAGNOSIS — Z85828 Personal history of other malignant neoplasm of skin: Secondary | ICD-10-CM | POA: Insufficient documentation

## 2017-03-29 DIAGNOSIS — I10 Essential (primary) hypertension: Secondary | ICD-10-CM | POA: Insufficient documentation

## 2017-03-29 DIAGNOSIS — N200 Calculus of kidney: Secondary | ICD-10-CM | POA: Diagnosis not present

## 2017-03-29 DIAGNOSIS — Z79899 Other long term (current) drug therapy: Secondary | ICD-10-CM | POA: Diagnosis not present

## 2017-03-29 LAB — BASIC METABOLIC PANEL
Anion gap: 9 (ref 5–15)
BUN: 14 mg/dL (ref 6–20)
CO2: 24 mmol/L (ref 22–32)
Calcium: 9 mg/dL (ref 8.9–10.3)
Chloride: 103 mmol/L (ref 101–111)
Creatinine, Ser: 0.96 mg/dL (ref 0.61–1.24)
GFR calc Af Amer: >60 mL/min (ref >60)
GLUCOSE: 121 mg/dL — AB (ref 65–99)
POTASSIUM: 3.9 mmol/L (ref 3.5–5.1)
SODIUM: 136 mmol/L (ref 135–145)

## 2017-03-29 LAB — CBC
HEMATOCRIT: 43.5 % (ref 40.0–52.0)
Hemoglobin: 14.3 g/dL (ref 13.0–18.0)
MCH: 28.4 pg (ref 26.0–34.0)
MCHC: 32.8 g/dL (ref 32.0–36.0)
MCV: 86.6 fL (ref 80.0–100.0)
PLATELETS: 241 10*3/uL (ref 150–440)
RBC: 5.02 MIL/uL (ref 4.40–5.90)
RDW: 14.2 % (ref 11.5–14.5)
WBC: 7.4 10*3/uL (ref 3.8–10.6)

## 2017-03-29 LAB — URINALYSIS, COMPLETE (UACMP) WITH MICROSCOPIC
Bacteria, UA: NONE SEEN
Bilirubin Urine: NEGATIVE
GLUCOSE, UA: NEGATIVE mg/dL
KETONES UR: NEGATIVE mg/dL
Nitrite: NEGATIVE
PROTEIN: NEGATIVE mg/dL
Specific Gravity, Urine: 1.01 (ref 1.005–1.030)
pH: 6 (ref 5.0–8.0)

## 2017-03-29 MED ORDER — IOPAMIDOL (ISOVUE-300) INJECTION 61%
100.0000 mL | Freq: Once | INTRAVENOUS | Status: AC | PRN
  Administered 2017-03-29: 100 mL via INTRAVENOUS

## 2017-03-29 NOTE — ED Notes (Signed)

## 2017-03-29 NOTE — ED Provider Notes (Signed)
[facility] Emergency Department Provider Note  ____________________________________________  Time seen: Approximately 11:27 PM  I have reviewed the triage vital signs and the nursing notes.   HISTORY  Chief Complaint Hematuria   HPI Timothy Knapp is a 77 y.o. male with a history of BPH, hypertension, and hyperlipidemia who presents for evaluation of hematuria.  Patient reports the symptoms started yesterday. Hematuria has been gross and intermittent for the last 36 hours. He had 2 episodes of gross hematuria yesterday which cleared towards the end of the day.  This morning patient also had another episode of gross hematuria.  He had 2 episodes of normal urine prior to arrival to the emergency room.  While in the emergency room the urine looked blood tinged.  He denies ever having hematuria in the past.  Is not on blood thinners.  He denies abdominal pain, back pain, dysuria, dizziness, chest pain or shortness of breath.  Past Medical History:  Diagnosis Date  . Arthritis   . BPH (benign prostatic hypertrophy)   . Hyperlipidemia   . Hypertension     Patient Active Problem List   Diagnosis Date Noted  . Special screening for malignant neoplasms, colon   . Benign neoplasm of ascending colon   . Diverticulosis of large intestine without diverticulitis   . Elevated PSA 02/14/2017  . Advanced care planning/counseling discussion 01/28/2017  . S/P total knee arthroplasty 12/10/2016  . Hematuria 12/05/2015  . Elevated liver enzymes 05/30/2015  . Hypertension 09/27/2014  . Benign prostatic hyperplasia with lower urinary tract symptoms 09/27/2014  . Hyperlipidemia 09/27/2014  . Postherpetic neuralgia 06/03/2014  . DDD (degenerative disc disease), thoracic 06/03/2014  . Back pain 05/27/2013    Past Surgical History:  Procedure Laterality Date  . COLONOSCOPY WITH PROPOFOL N/A 03/20/2017   Procedure: COLONOSCOPY WITH PROPOFOL;  Surgeon: Virgel Manifold, MD;  Location: [facility] ENDOSCOPY;  Service: Endoscopy;  Laterality: N/A;  . JOINT REPLACEMENT    . KNEE ARTHROPLASTY Right 12/10/2016   Procedure: COMPUTER ASSISTED TOTAL KNEE ARTHROPLASTY;  Surgeon: Dereck Leep, MD;  Location: [facility] ORS;  Service: Orthopedics;  Laterality: Right;  . PROSTATE SURGERY N/A   . SKIN CANCER EXCISION  11/09/2015    Prior to Admission medications   Medication Sig Start Date End Date Taking? Authorizing Provider  atorvastatin (LIPITOR) 40 MG tablet Take 1 tablet (40 mg total) by mouth at bedtime. 01/28/17   Guadalupe Maple, MD  b complex vitamins tablet Take 1 tablet by mouth daily.     [provider]  benazepril (LOTENSIN) 20 MG tablet Take 1 tablet (20 mg total) by mouth daily. 01/28/17   Guadalupe Maple, MD  benzonatate (TESSALON) 100 MG capsule Take 1 capsule (100 mg total) by mouth 2 (two) times daily as needed for cough. 03/25/17   Guadalupe Maple, MD  diclofenac sodium (VOLTAREN) 1 % GEL Apply 2 g topically 4 (four) times daily. 01/28/17   Guadalupe Maple, MD  gabapentin (NEURONTIN) 800 MG tablet Limited 1-2 tabs by mouth per day if tolerated Patient taking differently: Take 400-800 mg 2 (two) times daily by mouth. Takes (0.5 tablet) 400 mg in the morning and (1 tablet) 800 mg in the evening 09/19/15   Mohammed Kindle, MD  NON FORMULARY Place under the tongue daily. 5 drops daily    [provider]  Oxycodone HCl 10 MG TABS Take 1 tablet (10 mg total) by mouth 3 (three) times daily. 03/15/17  [name], MD  tamsulosin (FLOMAX) 0.4 MG CAPS capsule Take 1 capsule (0.4 mg total) by mouth daily. 01/28/17   Guadalupe Maple, MD  Turmeric 500 MG CAPS Take 500 mg daily by mouth. Reported on 05/30/2015    [provider]    Allergies Patient has no known allergies.  Family History  Problem Relation Age of Onset  . Heart disease Mother   . Prostate cancer Neg Hx   . Bladder Cancer Neg Hx   . Kidney cancer Neg Hx     Social  History Social History   Tobacco Use  . Smoking status: Never Smoker  . Smokeless tobacco: Never Used  Substance Use Topics  . Alcohol use: No    Alcohol/week: 0.0 oz    Frequency: Never  . Drug use: No    Review of Systems  Constitutional: Negative for fever. Eyes: Negative for visual changes. ENT: Negative for sore throat. Neck: No neck pain  Cardiovascular: Negative for chest pain. Respiratory: Negative for shortness of breath. Gastrointestinal: Negative for abdominal pain, vomiting or diarrhea. Genitourinary: Negative for dysuria. + hematuria Musculoskeletal: Negative for back pain. Skin: Negative for rash. Neurological: Negative for headaches, weakness or numbness. Psych: No SI or HI  ____________________________________________   PHYSICAL EXAM:  VITAL SIGNS: ED Triage Vitals  Enc Vitals Group     BP 03/29/17 1559 (!) 156/80     Pulse Rate 03/29/17 1559 73     Resp 03/29/17 1559 18     Temp 03/29/17 1559 98.1 F (36.7 C)     Temp Source 03/29/17 1559 Oral     SpO2 03/29/17 1559 93 %     Weight 03/29/17 1600 164 lb (74.4 kg)     Height 03/29/17 1600 5\' 7"  (1.702 m)     Head Circumference --      Peak Flow --      Pain Score 03/29/17 1600 0     Pain Loc --      Pain Edu? --      Excl. in [place]? --     Constitutional: Alert and oriented. Well appearing and in no apparent distress. HEENT:      Head: Normocephalic and atraumatic.         Eyes: Conjunctivae are normal. Sclera is non-icteric.       Mouth/Throat: Mucous membranes are moist.       Neck: Supple with no signs of meningismus. Cardiovascular: Regular rate and rhythm. No murmurs, gallops, or rubs. 2+ symmetrical distal pulses are present in all extremities. No JVD. Respiratory: Normal respiratory effort. Lungs are clear to auscultation bilaterally. No wheezes, crackles, or rhonchi.  Gastrointestinal: Soft, non tender, and non distended with positive bowel sounds. No rebound or  guarding. Genitourinary: No CVA tenderness. Musculoskeletal: Nontender with normal range of motion in all extremities. No edema, cyanosis, or erythema of extremities. Neurologic: Normal speech and language. Face is symmetric. Moving all extremities. No gross focal neurologic deficits are appreciated. Skin: Skin is warm, dry and intact. No rash noted. Psychiatric: Mood and affect are normal. Speech and behavior are normal.  ____________________________________________   LABS (all labs ordered are listed, but only abnormal results are displayed)  Labs Reviewed  URINALYSIS, COMPLETE (UACMP) WITH MICROSCOPIC - Abnormal; Notable for the following components:      Result Value   Color, Urine YELLOW (*)    APPearance HAZY (*)    Hgb urine dipstick LARGE (*)    Leukocytes, UA SMALL (*)  Squamous Epithelial / LPF 0-5 (*)    All other components within normal limits  BASIC METABOLIC PANEL - Abnormal; Notable for the following components:   Glucose, Bld 121 (*)    All other components within normal limits  URINE CULTURE  CBC   ____________________________________________  EKG  none  ____________________________________________  RADIOLOGY  I have personally reviewed the images performed during this visit and I agree with the Radiologist's read.   Interpretation by Radiologist:  Ct Abdomen Pelvis W Contrast  Result Date: 03/29/2017 CLINICAL DATA:  Hematuria EXAM: CT ABDOMEN AND PELVIS WITH CONTRAST TECHNIQUE: Multidetector CT imaging of the abdomen and pelvis was performed using the standard protocol following bolus administration of intravenous contrast. CONTRAST:  155mL ISOVUE-300 IOPAMIDOL (ISOVUE-300) INJECTION 61% COMPARISON:  04/25/2013 FINDINGS: Lower chest: Punctate granuloma in the right middle lobe. No acute consolidation or pleural effusion. Normal heart size. Small hiatal hernia. Hepatobiliary: No focal liver abnormality is seen. No gallstones, gallbladder wall thickening,  or biliary dilatation. Pancreas: Unremarkable. No pancreatic ductal dilatation or surrounding inflammatory changes. Spleen: Normal in size without focal abnormality. Adrenals/Urinary Tract: Adrenal glands are within normal limits. Punctate nonobstructing stones in the right kidney. Subcentimeter hypodense renal lesions too small to further characterize. No hydronephrosis. Bladder unremarkable. Stomach/Bowel: Stomach is within normal limits. Appendix appears normal. No evidence of bowel wall thickening, distention, or inflammatory changes. Sigmoid colon diverticular disease without acute inflammation Vascular/Lymphatic: Moderate aortic atherosclerosis. No aneurysmal dilatation. No significantly enlarged lymph nodes. Reproductive: Enlarged prostate with calcification Other: Negative for free air or free fluid. Small fat in the umbilical region Musculoskeletal: Degenerative changes. No acute or suspicious lesion. IMPRESSION: 1. No CT evidence for acute intra-abdominal or pelvic abnormality. 2. Punctate nonobstructing stones in the right kidney. 3. Sigmoid colon diverticular disease without acute inflammation 4. Enlarged prostate Electronically Signed   By: Donavan Foil M.D.   On: 03/29/2017 23:20     ____________________________________________   PROCEDURES  Procedure(s) performed: None Procedures Critical Care performed:  None ____________________________________________   INITIAL IMPRESSION / ASSESSMENT AND PLAN / ED COURSE  77 y.o. male with a history of BPH, hypertension, and hyperlipidemia who presents for evaluation of intermittent gross hematuria for 36 hours.  Patient is not on blood thinners, hemodynamically stable, stable hemoglobin of 14.3, normal kidney function.  UA showing blood but no evidence of infection.  Patient is asymptomatic.  CT abdomen pelvis was done which was within normal limits showing right-sided renal stone but no ureteral stone, and no other etiology of the bleeding.   Patient has no urinary retention and is able to fully empty his bladder.  Recommend that he follows up with his urologist on Monday.  Discussed return precautions for signs of acute blood loss anemia or urinary retention and recommended that he return to the emergency room if these develop.  Patient is comfortable with this plan.  Patient be discharged home at this time.      As part of my medical decision making, I reviewed the following data within the [place] notes reviewed and incorporated, Labs reviewed , Radiograph reviewed , Notes from prior ED visits and [place] Controlled Substance Database    Pertinent labs & imaging results that were available during my care of the patient were reviewed by me and considered in my medical decision making (see chart for details).    ____________________________________________   FINAL CLINICAL IMPRESSION(S) / ED DIAGNOSES  Final diagnoses:  Painless hematuria      NEW  MEDICATIONS STARTED DURING THIS VISIT:  ED Discharge Orders    None       Note:  This document was prepared using Dragon voice recognition software and may include unintentional dictation errors.    Alfred Levins, [place], MD 03/29/17 [phone]

## 2017-03-29 NOTE — Discharge Instructions (Addendum)
Follow-up with your urologist on Monday.  Return to the emergency room if you have difficulty emptying your bladder, abdominal pain, if you feel dizzy like you are going to pass out, or if the bleeding returns and it is persistent for more than 24 hours.

## 2017-03-29 NOTE — ED Triage Notes (Addendum)
Pt arrived via POV from home with reports of gross hematuria that started last night, pt states it cleared up this morning, but then started back this afternoon.  Pt denies any pain at this time. Pt denies any other urinary sxs, states he is able to fully empty his bladder. No prior hx of hematuria.  Pt denies any blood thinners and is not currently on any ASA.   Pt had recent elevated PSA and was seen by urologist.  Pt states 4 years dx with small kidney stones, but states no further problems since then.

## 2017-03-29 NOTE — ED Notes (Signed)
Patient transported to CT 

## 2017-03-31 LAB — URINE CULTURE: CULTURE: NO GROWTH

## 2017-04-01 ENCOUNTER — Telehealth: Payer: Self-pay | Admitting: Radiology

## 2017-04-01 NOTE — Telephone Encounter (Signed)
I am not aware to what he is referring.  Does he have some sort of reference?  I reviewed the note and he indicated he was going to have his PSA repeated with Dr. Jeananne Rama.

## 2017-04-01 NOTE — Telephone Encounter (Signed)
Pt was seen by Dr Bernardo Heater for elevated PSA. Pt would like to know if a T4 can be drawn. He isn't interested in the testosterone level itself, but in relation to the elevated PSA. Please advise.

## 2017-04-02 DIAGNOSIS — M25561 Pain in right knee: Secondary | ICD-10-CM | POA: Diagnosis not present

## 2017-04-02 DIAGNOSIS — Z96651 Presence of right artificial knee joint: Secondary | ICD-10-CM | POA: Diagnosis not present

## 2017-04-02 NOTE — Telephone Encounter (Signed)
Discussed test that pt was referring to. He is actually interested in a 4K score. Should pt be scheduled to RTC to discuss this?

## 2017-04-02 NOTE — Telephone Encounter (Signed)
I had discussed at the time of his initial visit as an option.  Can schedule a nurse visit/4K blood draw

## 2017-04-03 NOTE — Telephone Encounter (Signed)
Pt would like prior auth done prior to scheduling appt. Prior Timothy Knapp has been initiated.

## 2017-04-05 DIAGNOSIS — M25561 Pain in right knee: Secondary | ICD-10-CM | POA: Diagnosis not present

## 2017-04-05 DIAGNOSIS — Z96651 Presence of right artificial knee joint: Secondary | ICD-10-CM | POA: Diagnosis not present

## 2017-04-09 DIAGNOSIS — M25561 Pain in right knee: Secondary | ICD-10-CM | POA: Diagnosis not present

## 2017-04-09 DIAGNOSIS — Z96651 Presence of right artificial knee joint: Secondary | ICD-10-CM | POA: Diagnosis not present

## 2017-04-12 NOTE — Telephone Encounter (Signed)
Per Jackelyn Poling at [facility], [place] does not cover 4K score, therefore the request has been denied. LMOM to notify pt of same. Pt has appointment with Dr Bernardo Heater on 04/18/2017.

## 2017-04-15 ENCOUNTER — Encounter: Payer: Self-pay | Admitting: Gastroenterology

## 2017-04-15 DIAGNOSIS — M25561 Pain in right knee: Secondary | ICD-10-CM | POA: Diagnosis not present

## 2017-04-15 DIAGNOSIS — Z96651 Presence of right artificial knee joint: Secondary | ICD-10-CM | POA: Diagnosis not present

## 2017-04-16 ENCOUNTER — Telehealth: Payer: Self-pay

## 2017-04-16 NOTE — Telephone Encounter (Signed)
PA for 4K Score has been DENIED!

## 2017-04-17 DIAGNOSIS — M25561 Pain in right knee: Secondary | ICD-10-CM | POA: Diagnosis not present

## 2017-04-17 DIAGNOSIS — Z96651 Presence of right artificial knee joint: Secondary | ICD-10-CM | POA: Diagnosis not present

## 2017-04-18 ENCOUNTER — Ambulatory Visit (INDEPENDENT_AMBULATORY_CARE_PROVIDER_SITE_OTHER): Payer: PPO | Admitting: Urology

## 2017-04-18 ENCOUNTER — Encounter: Payer: Self-pay | Admitting: Urology

## 2017-04-18 VITALS — BP 158/81 | HR 55 | Resp 16 | Ht 68.0 in | Wt 164.6 lb

## 2017-04-18 DIAGNOSIS — R319 Hematuria, unspecified: Secondary | ICD-10-CM

## 2017-04-18 LAB — MICROSCOPIC EXAMINATION: RBC, UA: >30 /hpf — AB (ref 0–2)

## 2017-04-18 LAB — URINALYSIS, COMPLETE
BILIRUBIN UA: NEGATIVE
GLUCOSE, UA: NEGATIVE
Ketones, UA: NEGATIVE
Nitrite, UA: NEGATIVE
PROTEIN UA: NEGATIVE
Specific Gravity, UA: 1.02 (ref 1.005–1.030)
UUROB: 0.2 mg/dL (ref 0.2–1.0)
pH, UA: 6.5 (ref 5.0–7.5)

## 2017-04-18 NOTE — Progress Notes (Signed)
04/18/2017 2:10 PM   [name] 13-Dec-1940 [id number]  Referring provider: Guadalupe Maple, MD [address] [place], [place] [address]  Chief complaint: Blood in urine  HPI: 77 year old male who I saw in February 2019 for an elevated PSA. He initially elected to have his PSA repeated however subsequently decided to have a 4Kscore performed.  In the interim he developed 2 episodes of gross hematuria.  He was seen in the ED on 3/22 where UA showed too numerous to count RBCs and 6-30 WBCs.  A urine culture was negative.  A CT of the abdomen pelvis with contrast was performed and showed punctate renal calculi but no other abnormalities.  He denies bothersome lower urinary tract symptoms.  He denied flank, abdominal, pelvic or scrotal pain.  He is on tamsulosin.   PMH: Past Medical History:  Diagnosis Date  . Arthritis   . BPH (benign prostatic hypertrophy)   . Hyperlipidemia   . Hypertension     Surgical History: Past Surgical History:  Procedure Laterality Date  . COLONOSCOPY WITH PROPOFOL N/A 03/20/2017   Procedure: COLONOSCOPY WITH PROPOFOL;  Surgeon: Virgel Manifold, MD;  Location: [facility] END[facility]COPY;  Service: Endoscopy;  Laterality: N/A;  . JOINT REPLACEMENT    . KNEE ARTHROPLASTY Right 12/10/2016   Procedure: COMPUTER ASSISTED TOTAL KNEE ARTHROPLASTY;  Surgeon: Dereck Leep, MD;  Location: [facility] ORS;  Service: Orthopedics;  Laterality: Right;  . PROSTATE SURGERY N/A   . SKIN CANCER EXCISION  11/09/2015    Home Medications:  Allergies as of 04/18/2017   No Known Allergies     Medication List        Accurate as of 04/18/17  2:10 PM. Always use your most recent med list.          atorvastatin 40 MG tablet Commonly known as:  LIPITOR Take 1 tablet (40 mg total) by mouth at bedtime.   b complex vitamins tablet Take 1 tablet by mouth daily.   benazepril 20 MG tablet Commonly known as:  LOTENSIN Take 1 tablet (20 mg total) by mouth daily.   cholecalciferol  1000 units tablet Commonly known as:  VITAMIN D Take 1,000 Units by mouth daily.   NON FORMULARY Place under the tongue daily. 5 drops daily   Oxycodone HCl 10 MG Tabs Take 1 tablet (10 mg total) by mouth 3 (three) times daily.   tamsulosin 0.4 MG Caps capsule Commonly known as:  FLOMAX Take 1 capsule (0.4 mg total) by mouth daily.   Turmeric 500 MG Caps Take 500 mg daily by mouth. Reported on 05/30/2015   vitamin C 1000 MG tablet Take 1,000 mg by mouth daily.       Allergies: No Known Allergies  Family History: Family History  Problem Relation Age of Onset  . Heart disease Mother   . Prostate cancer Neg Hx   . Bladder Cancer Neg Hx   . Kidney cancer Neg Hx     Social History:  reports that he has never smoked. He has never used smokeless tobacco. He reports that he does not drink alcohol or use drugs.  ROS: UROLOGY Frequent Urination?: No Hard to postpone urination?: No Burning/pain with urination?: No Get up at night to urinate?: No Leakage of urine?: No Urine stream starts and stops?: No Trouble starting stream?: No Do you have to strain to urinate?: No Blood in urine?: Yes Urinary tract infection?: No Sexually transmitted disease?: No Injury to kidneys or bladder?: No Painful intercourse?:  No Weak stream?: No Erection problems?: No Penile pain?: No  Gastrointestinal Nausea?: No Vomiting?: No Indigestion/heartburn?: No Diarrhea?: No Constipation?: No  Constitutional Fever: No Night sweats?: No Weight loss?: No Fatigue?: No  Skin Skin rash/lesions?: No Itching?: No  Eyes Blurred vision?: No Double vision?: No  Ears/Nose/Throat Sore throat?: No Sinus problems?: No  Hematologic/Lymphatic Swollen glands?: No Easy bruising?: No  Cardiovascular Leg swelling?: No Chest pain?: No  Respiratory Cough?: No Shortness of breath?: No  Endocrine Excessive thirst?: No  Musculoskeletal Back pain?: No Joint pain?:  No  Neurological Headaches?: No Dizziness?: No  Psychologic Depression?: No Anxiety?: No  Physical Exam: BP (!) 158/81   Pulse (!) 55   Resp 16   Ht 5\' 8"  (1.727 m)   Wt 164 lb 9.6 oz (74.7 kg)   SpO2 98%   BMI 25.03 kg/m    Constitutional:  Alert and oriented, No acute distress. HEENT: [place] AT, moist mucus membranes.  Trachea midline, no masses. Cardiovascular: No clubbing, cyanosis, or edema. Respiratory: Normal respiratory effort, no increased work of breathing. GI: Abdomen is soft, nontender, nondistended, no abdominal masses GU: No CVA tenderness Lymph: No cervical or inguinal lymphadenopathy. Skin: No rashes, bruises or suspicious lesions. Neurologic: Grossly intact, no focal deficits, moving all 4 extremities. Psychiatric: Normal mood and affect.  Laboratory Data: Lab Results  Component Value Date   WBC 7.4 03/29/2017   HGB 14.3 03/29/2017   HCT 43.5 03/29/2017   MCV 86.6 03/29/2017   PLT 241 03/29/2017    Lab Results  Component Value Date   CREATININE 0.96 03/29/2017    Urinalysis Dipstick 3+ blood, trace leukocytes Microscopy 11-30 WBC >30 RBC   Assessment & Plan:   77 year old male with intermittent gross hematuria and persistent microhematuria.  A CT urogram was not performed however a CT of the abdomen pelvis with contrast showed no significant abnormalities.  I recommended cystoscopy for lower tract evaluation.  Urine culture was repeated.  His insurance company will not cover a 4Kscore.  His mildly elevated PSA may be secondary to inflammation.   Abbie Sons, [place] [address], [place], [place] [address] [phone]

## 2017-04-21 LAB — CULTURE, URINE COMPREHENSIVE

## 2017-04-22 ENCOUNTER — Encounter: Payer: Self-pay | Admitting: Urology

## 2017-04-22 ENCOUNTER — Telehealth: Payer: Self-pay | Admitting: Family Medicine

## 2017-04-22 NOTE — Telephone Encounter (Signed)
Patient notified

## 2017-04-22 NOTE — Telephone Encounter (Signed)
-----   Message from Abbie Sons, MD sent at 04/21/2017  9:11 AM EDT ----- Urine culture was negative for infection.  Keep cystoscopy appointment.

## 2017-05-05 IMAGING — CR DG FOOT COMPLETE 3+V*R*
1 series · 3 of 3 positions shown · non-contrast
Comparison: None.

CLINICAL DATA: Patient dropped heavy object onto foot 5 days prior.
Extensive bruising. Pain in the lateral foot. Initial encounter.

EXAM:
RIGHT FOOT COMPLETE - 3+ VIEW

[Series 1: dg foot complete right · 0.14mm/px · 3 of 3 slices shown]
[im 1/3]
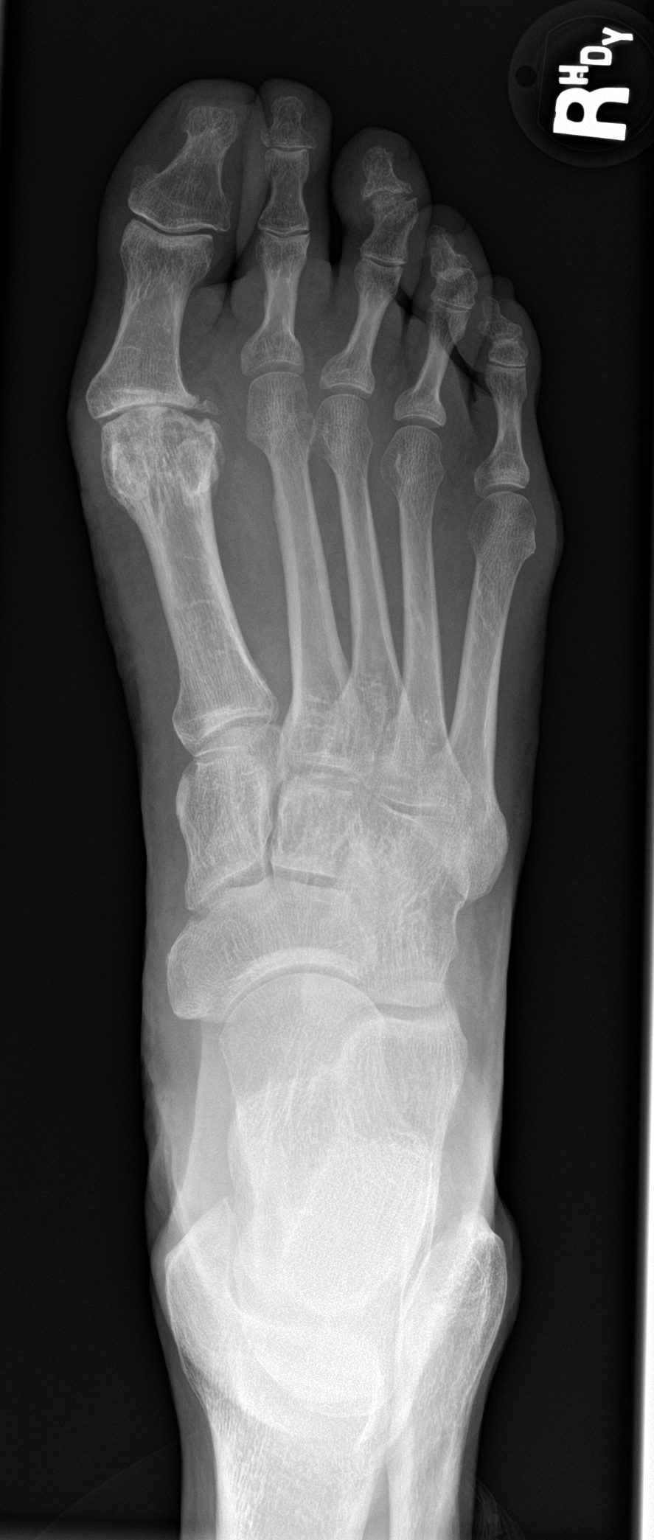
[im 2/3]
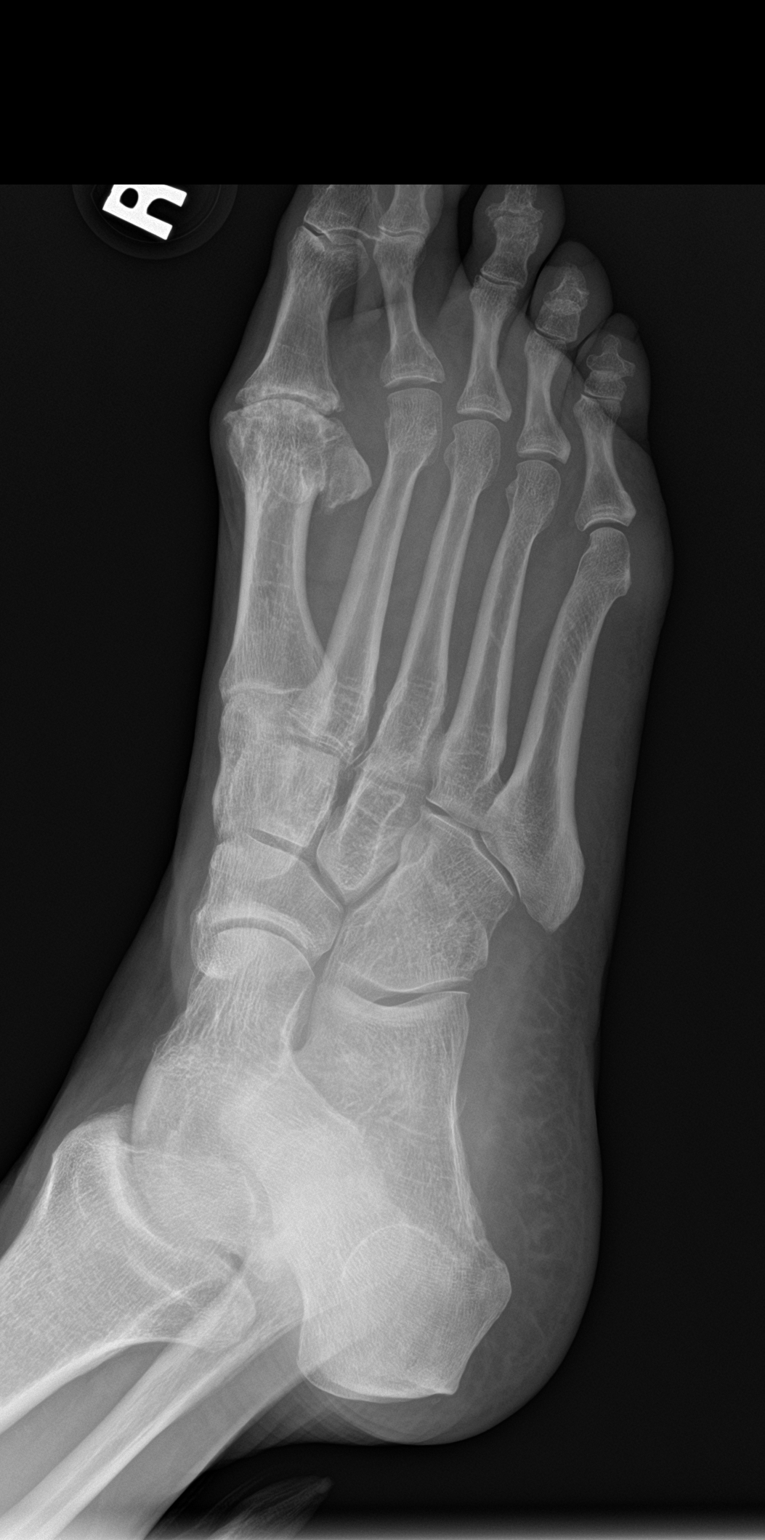
[im 3/3]
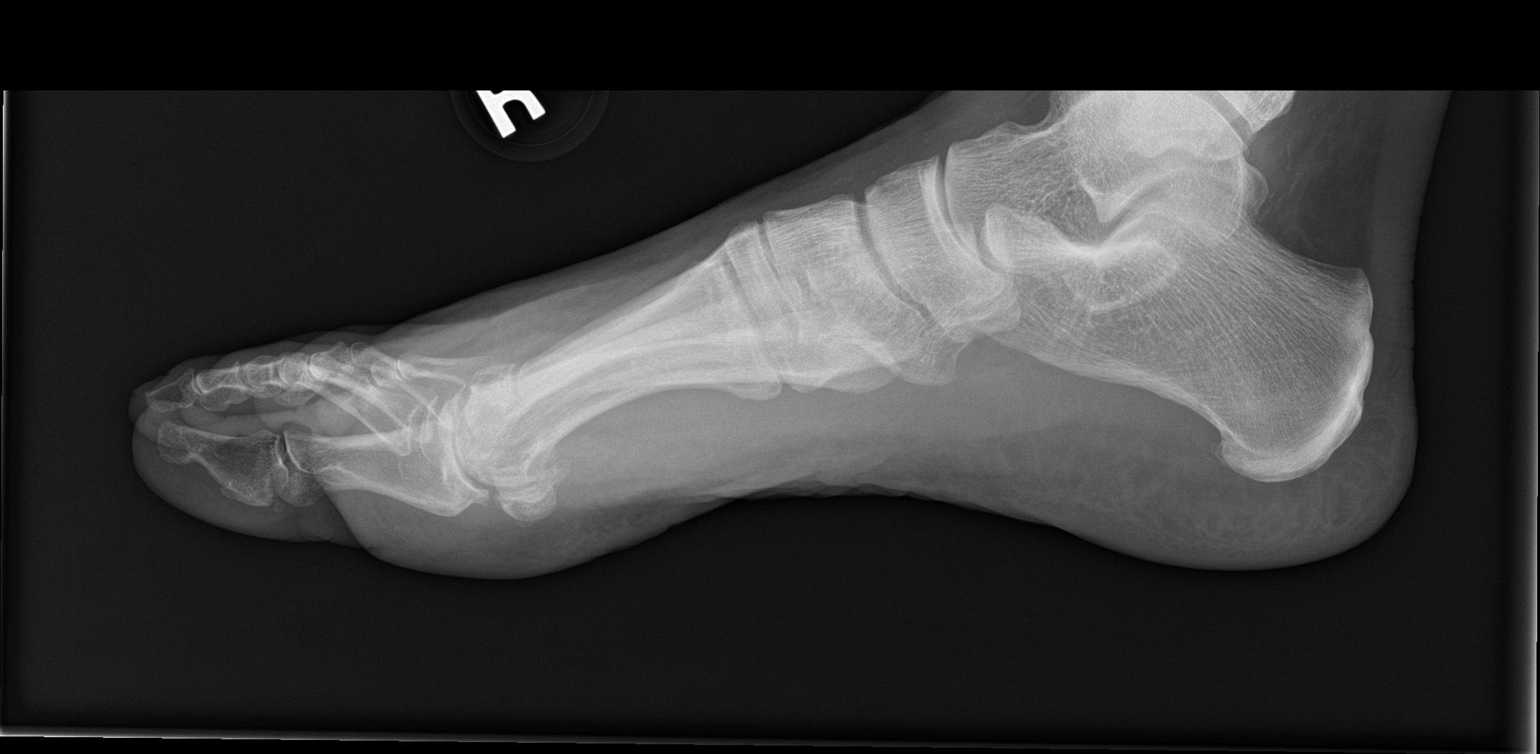

[3 of 3 positions shown; findings below may reference images not displayed]

FINDINGS: Hallux valgus deformity. First MTP joint degenerative changes.
Midfoot degenerative changes. No evidence for acute fracture or
dislocation.
IMPRESSION: No acute osseous abnormality.  First MTP joint degenerative changes.

## 2017-05-13 ENCOUNTER — Other Ambulatory Visit: Payer: Self-pay

## 2017-05-13 ENCOUNTER — Encounter: Payer: Self-pay | Admitting: Anesthesiology

## 2017-05-13 ENCOUNTER — Ambulatory Visit: Payer: PPO | Attending: Anesthesiology | Admitting: Anesthesiology

## 2017-05-13 VITALS — BP 134/68 | HR 60 | Temp 97.6°F | Resp 16 | Ht 67.0 in | Wt 164.0 lb

## 2017-05-13 DIAGNOSIS — B0229 Other postherpetic nervous system involvement: Secondary | ICD-10-CM | POA: Diagnosis not present

## 2017-05-13 DIAGNOSIS — M5134 Other intervertebral disc degeneration, thoracic region: Secondary | ICD-10-CM | POA: Insufficient documentation

## 2017-05-13 DIAGNOSIS — Z79899 Other long term (current) drug therapy: Secondary | ICD-10-CM | POA: Diagnosis not present

## 2017-05-13 DIAGNOSIS — F1190 Opioid use, unspecified, uncomplicated: Secondary | ICD-10-CM | POA: Diagnosis not present

## 2017-05-13 DIAGNOSIS — G894 Chronic pain syndrome: Secondary | ICD-10-CM | POA: Insufficient documentation

## 2017-05-13 DIAGNOSIS — G8929 Other chronic pain: Secondary | ICD-10-CM | POA: Diagnosis not present

## 2017-05-13 DIAGNOSIS — M546 Pain in thoracic spine: Secondary | ICD-10-CM | POA: Diagnosis not present

## 2017-05-13 DIAGNOSIS — R109 Unspecified abdominal pain: Secondary | ICD-10-CM | POA: Diagnosis present

## 2017-05-13 DIAGNOSIS — Z79891 Long term (current) use of opiate analgesic: Secondary | ICD-10-CM | POA: Insufficient documentation

## 2017-05-13 MED ORDER — OXYCODONE HCL 5 MG PO TABS: 5.0000 mg | ORAL_TABLET | Freq: Four times a day (QID) | ORAL | 0 refills | Status: DC | PRN

## 2017-05-13 MED ORDER — OXYCODONE HCL 5 MG PO TABS: 5.0000 mg | ORAL_TABLET | Freq: Four times a day (QID) | ORAL | 0 refills | Status: AC | PRN

## 2017-05-13 NOTE — Patient Instructions (Signed)
You have been given 2 scripts for oxycodone today.  

## 2017-05-13 NOTE — Progress Notes (Signed)
Nursing Pain Medication Assessment:  Safety precautions to be maintained throughout the outpatient stay will include: orient to surroundings, keep bed in low position, maintain call bell within reach at all times, provide assistance with transfer out of bed and ambulation.  Medication Inspection Compliance: Pill count conducted under aseptic conditions, in front of the patient. Neither the pills nor the bottle was removed from the patient's sight at any time. Once count was completed pills were immediately returned to the patient in their original bottle.  Medication: Oxycodone IR Pill/Patch Count: 5.5 of 75 pills remain Pill/Patch Appearance: Markings consistent with prescribed medication Bottle Appearance: Standard pharmacy container. Clearly labeled. Filled Date: 04/ 07 / 2019 Last Medication intake:  Today

## 2017-05-16 NOTE — Progress Notes (Signed)
Subjective:  Patient ID: Timothy Knapp, male    DOB: January 27, 1940  Age: 77 y.o. MRN: [id number]  CC: Abdominal Pain   Procedure: None  HPI [name] presents for reevaluation.  He was last seen approximately 2 months ago and has been doing well with his regimen.  His pain is under better control and he desires to begin to wean his pain medication as able.  Otherwise the quality of the pain is similar but reduced in severity.  Based on his narcotic assessment sheet he continues to derive good functional lifestyle improvement with the medications.  At this point he is been taking 10 mg tablets 3 times a day and this continues to give him good pain relief throughout the day and allows him to sleep at night.  No significant side effects are noted.  Distribution of his pain is remained stable.  Outpatient Medications Prior to Visit  Medication Sig Dispense Refill  . Ascorbic Acid (VITAMIN C) 1000 MG tablet Take 1,000 mg by mouth daily.    [name] atorvastatin (LIPITOR) 40 MG tablet Take 1 tablet (40 mg total) by mouth at bedtime. 90 tablet 4  . b complex vitamins tablet Take 1 tablet by mouth daily.     . benazepril (LOTENSIN) 20 MG tablet Take 1 tablet (20 mg total) by mouth daily. 90 tablet 4  . cholecalciferol (VITAMIN D) 1000 units tablet Take 1,000 Units by mouth daily.    . Ginger, Zingiber officinalis, (GINGER ROOT) 550 MG CAPS Take 1 capsule by mouth daily.    [name] ibuprofen (ADVIL,MOTRIN) 200 MG tablet Take 200 mg by mouth every 8 (eight) hours as needed.    . Menaquinone-7 (VITAMIN K2 PO) Take 100 mcg by mouth daily.    . NON FORMULARY Place under the tongue daily. 5 drops daily    . tamsulosin (FLOMAX) 0.4 MG CAPS capsule Take 1 capsule (0.4 mg total) by mouth daily. 30 capsule 3  . Turmeric 500 MG CAPS Take 500 mg daily by mouth. Reported on 05/30/2015    . Oxycodone HCl 10 MG TABS Take 1 tablet (10 mg total) by mouth 3 (three) times daily. 75 tablet 0   No facility-administered  medications prior to visit.     Review of Systems CNS: No confusion or sedation Cardiac: No angina or palpitations GI: No abdominal pain or constipation Constitutional: No nausea vomiting fevers or chills  Objective:  BP 134/68   Pulse 60   Temp 97.6 F (36.4 C)   Resp 16   Ht 5\' 7"  (1.702 m)   Wt 164 lb (74.4 kg)   SpO2 97%   BMI 25.69 kg/m    BP Readings from Last 3 Encounters:  05/13/17 134/68  04/18/17 (!) 158/81  03/29/17 136/75     Wt Readings from Last 3 Encounters:  05/13/17 164 lb (74.4 kg)  04/18/17 164 lb 9.6 oz (74.7 kg)  03/29/17 164 lb (74.4 kg)     Physical Exam Pt is alert and oriented PERRL EOMI HEART IS RRR no murmur or rub LCTA no wheezing or rales MUSCULOSKELETAL is without change  Labs  No results found for: HGBA1C Lab Results  Component Value Date   LDLCALC 106 (H) 01/28/2017   CREATININE 0.96 03/29/2017    -------------------------------------------------------------------------------------------------------------------- Lab Results  Component Value Date   WBC 7.4 03/29/2017   HGB 14.3 03/29/2017   HCT 43.5 03/29/2017   PLT 241 03/29/2017   GLUCOSE 121 (H) 03/29/2017   CHOL 185  01/28/2017   TRIG 174 (H) 01/28/2017   HDL 44 01/28/2017   LDLCALC 106 (H) 01/28/2017   ALT 18 01/28/2017   AST 29 01/28/2017   NA 136 03/29/2017   K 3.9 03/29/2017   CL 103 03/29/2017   CREATININE 0.96 03/29/2017   BUN 14 03/29/2017   CO2 24 03/29/2017   TSH 2.490 12/05/2015   INR 0.95 11/28/2016    --------------------------------------------------------------------------------------------------------------------- Ct Abdomen Pelvis W Contrast  Result Date: 03/29/2017 CLINICAL DATA:  Hematuria EXAM: CT ABDOMEN AND PELVIS WITH CONTRAST TECHNIQUE: Multidetector CT imaging of the abdomen and pelvis was performed using the standard protocol following bolus administration of intravenous contrast. CONTRAST:  175mL ISOVUE-300 IOPAMIDOL (ISOVUE-300)  INJECTION 61% COMPARISON:  04/25/2013 FINDINGS: Lower chest: Punctate granuloma in the right middle lobe. No acute consolidation or pleural effusion. Normal heart size. Small hiatal hernia. Hepatobiliary: No focal liver abnormality is seen. No gallstones, gallbladder wall thickening, or biliary dilatation. Pancreas: Unremarkable. No pancreatic ductal dilatation or surrounding inflammatory changes. Spleen: Normal in size without focal abnormality. Adrenals/Urinary Tract: Adrenal glands are within normal limits. Punctate nonobstructing stones in the right kidney. Subcentimeter hypodense renal lesions too small to further characterize. No hydronephrosis. Bladder unremarkable. Stomach/Bowel: Stomach is within normal limits. Appendix appears normal. No evidence of bowel wall thickening, distention, or inflammatory changes. Sigmoid colon diverticular disease without acute inflammation Vascular/Lymphatic: Moderate aortic atherosclerosis. No aneurysmal dilatation. No significantly enlarged lymph nodes. Reproductive: Enlarged prostate with calcification Other: Negative for free air or free fluid. Small fat in the umbilical region Musculoskeletal: Degenerative changes. No acute or suspicious lesion. IMPRESSION: 1. No CT evidence for acute intra-abdominal or pelvic abnormality. 2. Punctate nonobstructing stones in the right kidney. 3. Sigmoid colon diverticular disease without acute inflammation 4. Enlarged prostate Electronically Signed   By: Donavan Foil M.D.   On: 03/29/2017 23:20     Assessment & Plan:   Shay was seen today for abdominal pain.  Diagnoses and all orders for this visit:  Postherpetic neuralgia  Chronic right-sided thoracic back pain  DDD (degenerative disc disease), thoracic  Chronic, continuous use of opioids  Chronic pain syndrome  Other orders -     Discontinue: oxyCODONE (ROXICODONE) 5 MG immediate release tablet; Take 1 tablet (5 mg total) by mouth every 6 (six) hours as  needed. -     Discontinue: oxyCODONE (ROXICODONE) 5 MG immediate release tablet; Take 1 tablet (5 mg total) by mouth every 6 (six) hours as needed. -     Discontinue: oxyCODONE (ROXICODONE) 5 MG immediate release tablet; Take 1 tablet (5 mg total) by mouth every 6 (six) hours as needed. -     oxyCODONE (ROXICODONE) 5 MG immediate release tablet; Take 1 tablet (5 mg total) by mouth every 6 (six) hours as needed.        ----------------------------------------------------------------------------------------------------------------------  Problem List Items Addressed This Visit      Unprioritized   Back pain   Relevant Medications   ibuprofen (ADVIL,MOTRIN) 200 MG tablet   oxyCODONE (ROXICODONE) 5 MG immediate release tablet   DDD (degenerative disc disease), thoracic   Relevant Medications   ibuprofen (ADVIL,MOTRIN) 200 MG tablet   oxyCODONE (ROXICODONE) 5 MG immediate release tablet   Postherpetic neuralgia - Primary    Other Visit Diagnoses    Chronic, continuous use of opioids       Chronic pain syndrome            ----------------------------------------------------------------------------------------------------------------------  1. Postherpetic neuralgia At this point I am going to  alter his medication management to 5 mg tablets for 1 4 times a day at this point and for the next month and then reduce this to 100 tablets for the following month to be taken on a 3 times daily to 4 times daily as needed basis.  We will slowly wean over the course of the next 4 to 6 months as tolerated.  2. Chronic right-sided thoracic back pain As above  3. DDD (degenerative disc disease), thoracic Continue core stretching strengthening exercises.  4. Chronic, continuous use of opioids We have reviewed the [facility] practitioner database information and it is appropriate.  5. Chronic pain syndrome As above and return to clinic in 2  months    ----------------------------------------------------------------------------------------------------------------------  I have discontinued [name] "Ed"'s Oxycodone HCl. I am also having him maintain his b complex vitamins, Turmeric, atorvastatin, benazepril, tamsulosin, NON FORMULARY, cholecalciferol, vitamin C, Ginger Root, ibuprofen, Menaquinone-7 (VITAMIN K2 PO), and oxyCODONE.   Meds ordered this encounter  Medications  . DISCONTD: oxyCODONE (ROXICODONE) 5 MG immediate release tablet    Sig: Take 1 tablet (5 mg total) by mouth every 6 (six) hours as needed.    Dispense:  120 tablet    Refill:  0    Do not fill until [id number]  . DISCONTD: oxyCODONE (ROXICODONE) 5 MG immediate release tablet    Sig: Take 1 tablet (5 mg total) by mouth every 6 (six) hours as needed.    Dispense:  100 tablet    Refill:  0    Do not fill until [id number]  . DISCONTD: oxyCODONE (ROXICODONE) 5 MG immediate release tablet    Sig: Take 1 tablet (5 mg total) by mouth every 6 (six) hours as needed.    Dispense:  100 tablet    Refill:  0    Do not fill until [id number]  . oxyCODONE (ROXICODONE) 5 MG immediate release tablet    Sig: Take 1 tablet (5 mg total) by mouth every 6 (six) hours as needed.    Dispense:  120 tablet    Refill:  0    Do not fill until [id number]   Patient's Medications  New Prescriptions   OXYCODONE (ROXICODONE) 5 MG IMMEDIATE RELEASE TABLET    Take 1 tablet (5 mg total) by mouth every 6 (six) hours as needed.  Previous Medications   ASCORBIC ACID (VITAMIN C) 1000 MG TABLET    Take 1,000 mg by mouth daily.   ATORVASTATIN (LIPITOR) 40 MG TABLET    Take 1 tablet (40 mg total) by mouth at bedtime.   B COMPLEX VITAMINS TABLET    Take 1 tablet by mouth daily.    BENAZEPRIL (LOTENSIN) 20 MG TABLET    Take 1 tablet (20 mg total) by mouth daily.   CHOLECALCIFEROL (VITAMIN D) 1000 UNITS TABLET    Take 1,000 Units by mouth daily.   GINGER, ZINGIBER OFFICINALIS, (GINGER ROOT) 550  MG CAPS    Take 1 capsule by mouth daily.   IBUPROFEN (ADVIL,MOTRIN) 200 MG TABLET    Take 200 mg by mouth every 8 (eight) hours as needed.   MENAQUINONE-7 (VITAMIN K2 PO)    Take 100 mcg by mouth daily.   NON FORMULARY    Place under the tongue daily. 5 drops daily   TAMSULOSIN (FLOMAX) 0.4 MG CAPS CAPSULE    Take 1 capsule (0.4 mg total) by mouth daily.   TURMERIC 500 MG CAPS    Take 500 mg daily by mouth. Reported on  05/30/2015  Modified Medications   No medications on file  Discontinued Medications   OXYCODONE HCL 10 MG TABS    Take 1 tablet (10 mg total) by mouth 3 (three) times daily.   ----------------------------------------------------------------------------------------------------------------------  Follow-up: Return in about 2 months (around 07/13/2017) for evaluation, med refill.    [name], MD

## 2017-05-22 ENCOUNTER — Encounter: Payer: Self-pay | Admitting: Urology

## 2017-05-22 ENCOUNTER — Ambulatory Visit: Payer: PPO | Admitting: Urology

## 2017-05-22 DIAGNOSIS — R319 Hematuria, unspecified: Secondary | ICD-10-CM | POA: Diagnosis not present

## 2017-05-22 LAB — URINALYSIS, COMPLETE
Bilirubin, UA: NEGATIVE
Glucose, UA: NEGATIVE
Leukocytes, UA: NEGATIVE
NITRITE UA: NEGATIVE
PH UA: 5 (ref 5.0–7.5)
Protein, UA: NEGATIVE
Specific Gravity, UA: >1.03 — ABNORMAL HIGH (ref 1.005–1.030)
UUROB: 0.2 mg/dL (ref 0.2–1.0)

## 2017-05-22 LAB — MICROSCOPIC EXAMINATION: Epithelial Cells (non renal): NONE SEEN /HPF (ref 0–10)

## 2017-05-22 MED ORDER — CIPROFLOXACIN HCL 500 MG PO TABS
500.0000 mg | ORAL_TABLET | Freq: Once | ORAL | Status: AC
Start: 2017-05-22 — End: 2017-05-22
  Administered 2017-05-22: 500 mg via ORAL

## 2017-05-22 MED ORDER — LIDOCAINE HCL URETHRAL/MUCOSAL 2 % EX GEL
1.0000 "application " | Freq: Once | CUTANEOUS | Status: AC
  Administered 2017-05-22: 1 via URETHRAL

## 2017-05-22 NOTE — Progress Notes (Signed)
   05/22/17  CC: No chief complaint on file.   HPI: 77 year old male with microhematuria.  Refer to my office note dated 04/18/2017.  He denies gross hematuria.  There were no vitals taken for this visit. NED. A&Ox3.   No respiratory distress   Abd soft, NT, ND phallus with Peyronie's plaque left distal shaft.  bilateral descended testicles  Cystoscopy Procedure Note  Patient identification was confirmed, informed consent was obtained, and patient was prepped using Betadine solution.  Lidocaine jelly was administered per urethral meatus.    Preoperative abx where received prior to procedure.     Pre-Procedure: - Inspection reveals a normal caliber ureteral meatus.  Procedure: The flexible cystoscope was introduced without difficulty - No urethral strictures/lesions are present. - Lateral lobe enlargement with prominent hypervascularity and friability prostate  - Elevated bladder neck, moderate - Bilateral ureteral orifices identified - Bladder mucosa  reveals no ulcers, tumors, or lesions - No bladder stones -Moderate trabeculation  Retroflexion shows back bleeding from prostate noted   Post-Procedure: - Patient tolerated the procedure well  Assessment/ Plan: Microhematuria most likely secondary to BPH/prostatic inflammation which may be a cause of his mild PSA elevation.  Recommend a 57-month follow-up with repeat PSA.  He also complains of leftward curvature of his penis at 15 degrees and erectile dysfunction.  He was given literature on Xiaflex and if interested will call back.

## 2017-05-31 DIAGNOSIS — B351 Tinea unguium: Secondary | ICD-10-CM | POA: Diagnosis not present

## 2017-05-31 DIAGNOSIS — L570 Actinic keratosis: Secondary | ICD-10-CM | POA: Diagnosis not present

## 2017-05-31 DIAGNOSIS — C44519 Basal cell carcinoma of skin of other part of trunk: Secondary | ICD-10-CM | POA: Insufficient documentation

## 2017-05-31 DIAGNOSIS — C44319 Basal cell carcinoma of skin of other parts of face: Secondary | ICD-10-CM | POA: Insufficient documentation

## 2017-05-31 DIAGNOSIS — Z85828 Personal history of other malignant neoplasm of skin: Secondary | ICD-10-CM | POA: Diagnosis not present

## 2017-05-31 DIAGNOSIS — D229 Melanocytic nevi, unspecified: Secondary | ICD-10-CM | POA: Diagnosis not present

## 2017-05-31 DIAGNOSIS — D485 Neoplasm of uncertain behavior of skin: Secondary | ICD-10-CM | POA: Diagnosis not present

## 2017-06-01 ENCOUNTER — Other Ambulatory Visit: Payer: Self-pay | Admitting: Family Medicine

## 2017-06-01 DIAGNOSIS — N401 Enlarged prostate with lower urinary tract symptoms: Secondary | ICD-10-CM

## 2017-06-01 DIAGNOSIS — R3912 Poor urinary stream: Principal | ICD-10-CM

## 2017-06-04 NOTE — Telephone Encounter (Signed)
Tamsulosin refill Last OV: 02/27/17 Last Refill:01/28/17 #30 cap 3 RF Pharmacy:[facility] [id number] PCP: Golden Pop MD

## 2017-07-08 ENCOUNTER — Other Ambulatory Visit: Payer: Self-pay

## 2017-07-08 ENCOUNTER — Encounter: Payer: Self-pay | Admitting: Anesthesiology

## 2017-07-08 ENCOUNTER — Ambulatory Visit: Payer: PPO | Attending: Anesthesiology | Admitting: Anesthesiology

## 2017-07-08 VITALS — BP 124/70 | HR 55 | Temp 98.0°F | Resp 18 | Ht 67.0 in | Wt 155.0 lb

## 2017-07-08 DIAGNOSIS — F1190 Opioid use, unspecified, uncomplicated: Secondary | ICD-10-CM

## 2017-07-08 DIAGNOSIS — G894 Chronic pain syndrome: Secondary | ICD-10-CM | POA: Insufficient documentation

## 2017-07-08 DIAGNOSIS — M546 Pain in thoracic spine: Secondary | ICD-10-CM | POA: Diagnosis not present

## 2017-07-08 DIAGNOSIS — M549 Dorsalgia, unspecified: Secondary | ICD-10-CM | POA: Diagnosis present

## 2017-07-08 DIAGNOSIS — Z79899 Other long term (current) drug therapy: Secondary | ICD-10-CM | POA: Insufficient documentation

## 2017-07-08 DIAGNOSIS — B0229 Other postherpetic nervous system involvement: Secondary | ICD-10-CM | POA: Insufficient documentation

## 2017-07-08 DIAGNOSIS — G8929 Other chronic pain: Secondary | ICD-10-CM

## 2017-07-08 DIAGNOSIS — Z79891 Long term (current) use of opiate analgesic: Secondary | ICD-10-CM | POA: Diagnosis not present

## 2017-07-08 DIAGNOSIS — M5134 Other intervertebral disc degeneration, thoracic region: Secondary | ICD-10-CM

## 2017-07-08 MED ORDER — HYDROCODONE-ACETAMINOPHEN 7.5-325 MG PO TABS: 1.0000 | ORAL_TABLET | Freq: Three times a day (TID) | ORAL | 0 refills | Status: DC

## 2017-07-08 NOTE — Progress Notes (Signed)
Nursing Pain Medication Assessment:  Safety precautions to be maintained throughout the outpatient stay will include: orient to surroundings, keep bed in low position, maintain call bell within reach at all times, provide assistance with transfer out of bed and ambulation.  Medication Inspection Compliance: Pill count conducted under aseptic conditions, in front of the patient. Neither the pills nor the bottle was removed from the patient's sight at any time. Once count was completed pills were immediately returned to the patient in their original bottle.  Medication: Oxycodone IR Pill/Patch Count: 0 of 100 pills remain Pill/Patch Appearance: Markings consistent with prescribed medication Bottle Appearance: Standard pharmacy container. Clearly labeled. Filled Date:06 / 08/ 2019 Last Medication intake:  Today

## 2017-07-08 NOTE — Patient Instructions (Signed)
Hydrocodone prescription given x2.

## 2017-07-09 NOTE — Progress Notes (Signed)
Subjective:  Patient ID: Timothy Knapp, male    DOB: 09-07-1940  Age: 77 y.o. MRN: [id number]  CC: Back Pain (back)   Procedure: None  HPI [name] presents for reevaluation.  He was last seen approximately 2 months ago and we recently reduced the amount of opioid he was receiving approximately 50%.  He has had a lot more pain during the day and at night since then.  The quality characteristic distribution of the pain is otherwise been stable.  This primarily affects his right flank and right lateral abdominal wall and back.  Otherwise she is in his usual state of health at this time.  Based on his narcotic assessment sheet he continues to derive good functional lifestyle improvement with his medications.  Outpatient Medications Prior to Visit  Medication Sig Dispense Refill  . Ascorbic Acid (VITAMIN C) 1000 MG tablet Take 1,000 mg by mouth daily.    [name] atorvastatin (LIPITOR) 40 MG tablet Take 1 tablet (40 mg total) by mouth at bedtime. 90 tablet 4  . b complex vitamins tablet Take 1 tablet by mouth daily.     . benazepril (LOTENSIN) 20 MG tablet Take 1 tablet (20 mg total) by mouth daily. 90 tablet 4  . cholecalciferol (VITAMIN D) 1000 units tablet Take 1,000 Units by mouth daily.    . Ginger, Zingiber officinalis, (GINGER ROOT) 550 MG CAPS Take 1 capsule by mouth daily.    [name] ibuprofen (ADVIL,MOTRIN) 200 MG tablet Take 200 mg by mouth every 8 (eight) hours as needed.    . Menaquinone-7 (VITAMIN K2 PO) Take 100 mcg by mouth daily.    . NON FORMULARY Place under the tongue daily. 5 drops daily    . tamsulosin (FLOMAX) 0.4 MG CAPS capsule TAKE 1 CAPSULE(0.4 MG) BY MOUTH DAILY 30 capsule 0  . terbinafine (LAMISIL) 250 MG tablet TK 1 T PO D  2  . Turmeric 500 MG CAPS Take 500 mg daily by mouth. Reported on 05/30/2015    . oxyCODONE (OXY IR/ROXICODONE) 5 MG immediate release tablet TK 1 T PO Q 6 H PRN  0   No facility-administered medications prior to visit.     Review of  Systems CNS: No confusion or sedation Cardiac: No angina or palpitations GI: No abdominal pain or constipation Constitutional: No nausea vomiting fevers or chills  Objective:  BP 124/70   Pulse (!) 55   Temp 98 F (36.7 C)   Resp 18   Ht 5\' 7"  (1.702 m)   Wt 155 lb (70.3 kg)   SpO2 95%   BMI 24.28 kg/m    BP Readings from Last 3 Encounters:  07/08/17 124/70  05/13/17 134/68  04/18/17 (!) 158/81     Wt Readings from Last 3 Encounters:  07/08/17 155 lb (70.3 kg)  05/13/17 164 lb (74.4 kg)  04/18/17 164 lb 9.6 oz (74.7 kg)     Physical Exam Pt is alert and oriented PERRL EOMI HEART IS RRR no murmur or rub LCTA no wheezing or rales MUSCULOSKELETAL reveals good muscle tone and bulk to the upper and lower extremities.  He is moving about well.  Labs  No results found for: HGBA1C Lab Results  Component Value Date   LDLCALC 106 (H) 01/28/2017   CREATININE 0.96 03/29/2017    -------------------------------------------------------------------------------------------------------------------- Lab Results  Component Value Date   WBC 7.4 03/29/2017   HGB 14.3 03/29/2017   HCT 43.5 03/29/2017   PLT 241 03/29/2017   GLUCOSE 121 (  H) 03/29/2017   CHOL 185 01/28/2017   TRIG 174 (H) 01/28/2017   HDL 44 01/28/2017   LDLCALC 106 (H) 01/28/2017   ALT 18 01/28/2017   AST 29 01/28/2017   NA 136 03/29/2017   K 3.9 03/29/2017   CL 103 03/29/2017   CREATININE 0.96 03/29/2017   BUN 14 03/29/2017   CO2 24 03/29/2017   TSH 2.490 12/05/2015   INR 0.95 11/28/2016    --------------------------------------------------------------------------------------------------------------------- Ct Abdomen Pelvis W Contrast  Result Date: 03/29/2017 CLINICAL DATA:  Hematuria EXAM: CT ABDOMEN AND PELVIS WITH CONTRAST TECHNIQUE: Multidetector CT imaging of the abdomen and pelvis was performed using the standard protocol following bolus administration of intravenous contrast. CONTRAST:   195mL ISOVUE-300 IOPAMIDOL (ISOVUE-300) INJECTION 61% COMPARISON:  04/25/2013 FINDINGS: Lower chest: Punctate granuloma in the right middle lobe. No acute consolidation or pleural effusion. Normal heart size. Small hiatal hernia. Hepatobiliary: No focal liver abnormality is seen. No gallstones, gallbladder wall thickening, or biliary dilatation. Pancreas: Unremarkable. No pancreatic ductal dilatation or surrounding inflammatory changes. Spleen: Normal in size without focal abnormality. Adrenals/Urinary Tract: Adrenal glands are within normal limits. Punctate nonobstructing stones in the right kidney. Subcentimeter hypodense renal lesions too small to further characterize. No hydronephrosis. Bladder unremarkable. Stomach/Bowel: Stomach is within normal limits. Appendix appears normal. No evidence of bowel wall thickening, distention, or inflammatory changes. Sigmoid colon diverticular disease without acute inflammation Vascular/Lymphatic: Moderate aortic atherosclerosis. No aneurysmal dilatation. No significantly enlarged lymph nodes. Reproductive: Enlarged prostate with calcification Other: Negative for free air or free fluid. Small fat in the umbilical region Musculoskeletal: Degenerative changes. No acute or suspicious lesion. IMPRESSION: 1. No CT evidence for acute intra-abdominal or pelvic abnormality. 2. Punctate nonobstructing stones in the right kidney. 3. Sigmoid colon diverticular disease without acute inflammation 4. Enlarged prostate Electronically Signed   By: Donavan Foil M.D.   On: 03/29/2017 23:20     Assessment & Plan:   Bela was seen today for back pain.  Diagnoses and all orders for this visit:  Postherpetic neuralgia  Chronic right-sided thoracic back pain  DDD (degenerative disc disease), thoracic  Chronic, continuous use of opioids  Chronic pain syndrome  Other orders -     Discontinue: HYDROcodone-acetaminophen (NORCO) 7.5-325 MG tablet; Take 1 tablet by mouth 3 (three)  times daily. -     HYDROcodone-acetaminophen (NORCO) 7.5-325 MG tablet; Take 1 tablet by mouth 3 (three) times daily.        ----------------------------------------------------------------------------------------------------------------------  Problem List Items Addressed This Visit      Unprioritized   Back pain   Relevant Medications   oxyCODONE (OXY IR/ROXICODONE) 5 MG immediate release tablet   HYDROcodone-acetaminophen (NORCO) 7.5-325 MG tablet   DDD (degenerative disc disease), thoracic   Relevant Medications   oxyCODONE (OXY IR/ROXICODONE) 5 MG immediate release tablet   HYDROcodone-acetaminophen (NORCO) 7.5-325 MG tablet   Postherpetic neuralgia - Primary    Other Visit Diagnoses    Chronic, continuous use of opioids       Chronic pain syndrome            ----------------------------------------------------------------------------------------------------------------------  1. Postherpetic neuralgia Unfortunately he has been unable to tolerate the extent of reduction in his opioid medications.  We had a long discussion today regarding this and I will increase his medication to hydrocodone 7.5 mg tablets to be taken 3 times a day.  We have gone over the risks and benefits of the medication regimen.  We have also talked about adding an ibuprofen 200 to 400  mg twice daily to 3 times daily dosing.  We have talked about caution regarding a acetaminophen-containing medications in addition to his Vicodin.  We will schedule him for return to clinic in 2 months.  Prescriptions will be given for the next 2 months.  2. Chronic right-sided thoracic back pain As above.  Continue stretching strengthening exercises as tolerated.  3. DDD (degenerative disc disease), thoracic As above  4. Chronic, continuous use of opioids We have reviewed the [facility] practitioner database information and it is appropriate.  5. Chronic pain syndrome As  above    ----------------------------------------------------------------------------------------------------------------------  I am having [name] "Ed" maintain his b complex vitamins, Turmeric, atorvastatin, benazepril, NON FORMULARY, cholecalciferol, vitamin C, Ginger Root, ibuprofen, Menaquinone-7 (VITAMIN K2 PO), tamsulosin, oxyCODONE, terbinafine, and HYDROcodone-acetaminophen.   Meds ordered this encounter  Medications  . DISCONTD: HYDROcodone-acetaminophen (NORCO) 7.5-325 MG tablet    Sig: Take 1 tablet by mouth 3 (three) times daily.    Dispense:  90 tablet    Refill:  0  . HYDROcodone-acetaminophen (NORCO) 7.5-325 MG tablet    Sig: Take 1 tablet by mouth 3 (three) times daily.    Dispense:  90 tablet    Refill:  0    Do not fill until [id number]   Patient's Medications  New Prescriptions   HYDROCODONE-ACETAMINOPHEN (NORCO) 7.5-325 MG TABLET    Take 1 tablet by mouth 3 (three) times daily.  Previous Medications   ASCORBIC ACID (VITAMIN C) 1000 MG TABLET    Take 1,000 mg by mouth daily.   ATORVASTATIN (LIPITOR) 40 MG TABLET    Take 1 tablet (40 mg total) by mouth at bedtime.   B COMPLEX VITAMINS TABLET    Take 1 tablet by mouth daily.    BENAZEPRIL (LOTENSIN) 20 MG TABLET    Take 1 tablet (20 mg total) by mouth daily.   CHOLECALCIFEROL (VITAMIN D) 1000 UNITS TABLET    Take 1,000 Units by mouth daily.   GINGER, ZINGIBER OFFICINALIS, (GINGER ROOT) 550 MG CAPS    Take 1 capsule by mouth daily.   IBUPROFEN (ADVIL,MOTRIN) 200 MG TABLET    Take 200 mg by mouth every 8 (eight) hours as needed.   MENAQUINONE-7 (VITAMIN K2 PO)    Take 100 mcg by mouth daily.   NON FORMULARY    Place under the tongue daily. 5 drops daily   OXYCODONE (OXY IR/ROXICODONE) 5 MG IMMEDIATE RELEASE TABLET    TK 1 T PO Q 6 H PRN   TAMSULOSIN (FLOMAX) 0.4 MG CAPS CAPSULE    TAKE 1 CAPSULE(0.4 MG) BY MOUTH DAILY   TERBINAFINE (LAMISIL) 250 MG TABLET    TK 1 T PO D   TURMERIC 500 MG CAPS    Take 500 mg  daily by mouth. Reported on 05/30/2015  Modified Medications   No medications on file  Discontinued Medications   No medications on file   ----------------------------------------------------------------------------------------------------------------------  Follow-up: Return in about 2 months (around 09/08/2017) for evaluation, med refill.    [name], MD

## 2017-07-31 DIAGNOSIS — C44519 Basal cell carcinoma of skin of other part of trunk: Secondary | ICD-10-CM | POA: Diagnosis not present

## 2017-07-31 DIAGNOSIS — Z85828 Personal history of other malignant neoplasm of skin: Secondary | ICD-10-CM | POA: Diagnosis not present

## 2017-07-31 DIAGNOSIS — L814 Other melanin hyperpigmentation: Secondary | ICD-10-CM | POA: Diagnosis not present

## 2017-07-31 DIAGNOSIS — L821 Other seborrheic keratosis: Secondary | ICD-10-CM | POA: Diagnosis not present

## 2017-07-31 DIAGNOSIS — L578 Other skin changes due to chronic exposure to nonionizing radiation: Secondary | ICD-10-CM | POA: Diagnosis not present

## 2017-07-31 DIAGNOSIS — C44319 Basal cell carcinoma of skin of other parts of face: Secondary | ICD-10-CM | POA: Diagnosis not present

## 2017-07-31 DIAGNOSIS — L570 Actinic keratosis: Secondary | ICD-10-CM | POA: Diagnosis not present

## 2017-08-01 DIAGNOSIS — Z96651 Presence of right artificial knee joint: Secondary | ICD-10-CM | POA: Diagnosis not present

## 2017-08-01 DIAGNOSIS — M25562 Pain in left knee: Secondary | ICD-10-CM | POA: Diagnosis not present

## 2017-08-01 DIAGNOSIS — G8929 Other chronic pain: Secondary | ICD-10-CM | POA: Diagnosis not present

## 2017-08-01 DIAGNOSIS — M25461 Effusion, right knee: Secondary | ICD-10-CM | POA: Diagnosis not present

## 2017-08-01 DIAGNOSIS — M170 Bilateral primary osteoarthritis of knee: Secondary | ICD-10-CM | POA: Diagnosis not present

## 2017-08-01 DIAGNOSIS — M25561 Pain in right knee: Secondary | ICD-10-CM | POA: Diagnosis not present

## 2017-08-01 DIAGNOSIS — M25462 Effusion, left knee: Secondary | ICD-10-CM | POA: Diagnosis not present

## 2017-08-01 DIAGNOSIS — M76891 Other specified enthesopathies of right lower limb, excluding foot: Secondary | ICD-10-CM | POA: Diagnosis not present

## 2017-08-21 ENCOUNTER — Encounter: Payer: Self-pay | Admitting: Urology

## 2017-08-21 ENCOUNTER — Ambulatory Visit: Payer: PPO | Admitting: Urology

## 2017-08-21 VITALS — BP 147/75 | HR 70 | Ht 67.0 in | Wt 160.7 lb

## 2017-08-21 DIAGNOSIS — R319 Hematuria, unspecified: Secondary | ICD-10-CM | POA: Diagnosis not present

## 2017-08-21 DIAGNOSIS — R972 Elevated prostate specific antigen [PSA]: Secondary | ICD-10-CM

## 2017-08-21 NOTE — Progress Notes (Signed)
08/21/2017 2:45 PM   Timothy Knapp December 22, 1940 [id number]  Referring provider: Guadalupe Maple, MD [address] [place], [place] [address]  Chief Complaint  Patient presents with  . Follow-up    HPI: 77 year old male initially referred February 2019 for PSA of 5.2 with prior PSA of 3.5.  He subsequently developed gross hematuria and had an unremarkable CT urogram.  Cystoscopy remarkable for hypervascular prostate with prominent superficial vessels.  He denies recurrent gross hematuria.  It was felt his mildly elevated PSA may be secondary to prostatic inflammation and a 60-month follow-up PSA was recommended.  His DRE was benign.  At his last visit he was also complaining of penile curvature/Peyronie's disease.  He states his curvature has resolved in the last 3 months however he now has an hourglass deformity and decreased rigidity of the distal shaft.   PMH: Past Medical History:  Diagnosis Date  . Arthritis   . BPH (benign prostatic hypertrophy)   . Hyperlipidemia   . Hypertension     Surgical History: Past Surgical History:  Procedure Laterality Date  . COLONOSCOPY WITH PROPOFOL N/A 03/20/2017   Procedure: COLONOSCOPY WITH PROPOFOL;  Surgeon: Virgel Manifold, MD;  Location: [facility] ENDOSCOPY;  Service: Endoscopy;  Laterality: N/A;  . JOINT REPLACEMENT    . KNEE ARTHROPLASTY Right 12/10/2016   Procedure: COMPUTER ASSISTED TOTAL KNEE ARTHROPLASTY;  Surgeon: Dereck Leep, MD;  Location: [facility] ORS;  Service: Orthopedics;  Laterality: Right;  . PROSTATE SURGERY N/A   . SKIN CANCER EXCISION  11/09/2015    Home Medications:  Allergies as of 08/21/2017   No Known Allergies     Medication List        Accurate as of 08/21/17  2:45 PM. Always use your most recent med list.          atorvastatin 40 MG tablet Commonly known as:  LIPITOR Take 1 tablet (40 mg total) by mouth at bedtime.   b complex vitamins tablet Take 1 tablet by mouth daily.   benazepril 20 MG  tablet Commonly known as:  LOTENSIN Take 1 tablet (20 mg total) by mouth daily.   CAPZASIN QUICK RELIEF 0.025-10 % Gel Generic drug:  Capsaicin-Menthol Apply topically.   cholecalciferol 1000 units tablet Commonly known as:  VITAMIN D Take 1,000 Units by mouth daily.   diclofenac sodium 1 % Gel Commonly known as:  VOLTAREN APP 2 GRAMS EXT AA QID   Ginger Root 550 MG Caps Take 1 capsule by mouth daily.   HYDROcodone-acetaminophen 7.5-325 MG tablet Commonly known as:  NORCO Take 1 tablet by mouth 3 (three) times daily.   ibuprofen 200 MG tablet Commonly known as:  ADVIL,MOTRIN Take 200 mg by mouth every 8 (eight) hours as needed.   lidocaine 5 % Commonly known as:  LIDODERM Apply 1 to 2 patches to painful areas of the skin for 12 hours then remove for 12 hours if tolerated and repeat process   Magnesium Citrate 200 MG Tabs Take by mouth.   NON FORMULARY Place under the tongue daily. 5 drops daily   Selenium 200 MCG Caps Take by mouth.   tamsulosin 0.4 MG Caps capsule Commonly known as:  FLOMAX TAKE 1 CAPSULE(0.4 MG) BY MOUTH DAILY   terbinafine 250 MG tablet Commonly known as:  LAMISIL TK 1 T PO D   Turmeric 500 MG Caps Take 500 mg daily by mouth. Reported on 05/30/2015   vitamin C 1000 MG tablet Take 1,000 mg by mouth  daily.   VITAMIN K2 PO Take 100 mcg by mouth daily.       Allergies: No Known Allergies  Family History: Family History  Problem Relation Age of Onset  . Heart disease Mother   . Prostate cancer Neg Hx   . Bladder Cancer Neg Hx   . Kidney cancer Neg Hx     Social History:  reports that he has never smoked. He has never used smokeless tobacco. He reports that he does not drink alcohol or use drugs.  ROS: UROLOGY Frequent Urination?: No Hard to postpone urination?: No Burning/pain with urination?: No Get up at night to urinate?: No Leakage of urine?: No Urine stream starts and stops?: No Trouble starting stream?: No Do you  have to strain to urinate?: No Blood in urine?: No Urinary tract infection?: No Sexually transmitted disease?: No Injury to kidneys or bladder?: No Painful intercourse?: No Weak stream?: No Erection problems?: Yes Penile pain?: No  Gastrointestinal Nausea?: No Vomiting?: No Indigestion/heartburn?: No Diarrhea?: No Constipation?: No  Constitutional Fever: No Night sweats?: No Weight loss?: No Fatigue?: No  Skin Skin rash/lesions?: No Itching?: No  Eyes Blurred vision?: No Double vision?: No  Ears/Nose/Throat Sore throat?: No Sinus problems?: No  Hematologic/Lymphatic Swollen glands?: No Easy bruising?: No  Cardiovascular Leg swelling?: Yes Chest pain?: No  Respiratory Cough?: No Shortness of breath?: No  Endocrine Excessive thirst?: No  Musculoskeletal Back pain?: No Joint pain?: No  Neurological Headaches?: No Dizziness?: No  Psychologic Depression?: No Anxiety?: No  Physical Exam: BP (!) 147/75 (BP Location: Left Arm, Patient Position: Sitting, Cuff Size: Normal)   Pulse 70   Ht 5\' 7"  (1.702 m)   Wt 160 lb 11.2 oz (72.9 kg)   BMI 25.17 kg/m   Constitutional:  Alert and oriented, No acute distress. HEENT: [place] AT, moist mucus membranes.  Trachea midline, no masses. Cardiovascular: No clubbing, cyanosis, or edema. Respiratory: Normal respiratory effort, no increased work of breathing. GI: Abdomen is soft, nontender, nondistended, no abdominal masses GU: No CVA tenderness Lymph: No cervical or inguinal lymphadenopathy. Skin: No rashes, bruises or suspicious lesions. Neurologic: Grossly intact, no focal deficits, moving all 4 extremities. Psychiatric: Normal mood and affect.   Assessment & Plan:   77 year old male with a mildly elevated PSA and abnormal PSA velocity.  His PSA will be repeated today and he will be notified with the results.  AUA recommendations for prostate screening are to discontinue screening after age 53-75.  If his PSA  returns to baseline would discontinue screening.  If it remains persistently elevated we discussed potential options of surveillance, biopsy and adjunctive blood testing such as 4K.   Abbie Sons, [place] [address] [place],  [address] [phone]

## 2017-08-22 LAB — PSA: PROSTATE SPECIFIC AG, SERUM: 4.8 ng/mL — AB (ref 0.0–4.0)

## 2017-08-25 ENCOUNTER — Encounter: Payer: Self-pay | Admitting: Urology

## 2017-08-27 DIAGNOSIS — Z96651 Presence of right artificial knee joint: Secondary | ICD-10-CM | POA: Diagnosis not present

## 2017-08-27 DIAGNOSIS — M76899 Other specified enthesopathies of unspecified lower limb, excluding foot: Secondary | ICD-10-CM | POA: Diagnosis not present

## 2017-09-04 ENCOUNTER — Encounter: Payer: Self-pay | Admitting: Anesthesiology

## 2017-09-04 ENCOUNTER — Ambulatory Visit: Payer: PPO | Attending: Anesthesiology | Admitting: Anesthesiology

## 2017-09-04 VITALS — BP 137/74 | HR 58 | Temp 97.9°F | Resp 16 | Ht 67.0 in | Wt 165.0 lb

## 2017-09-04 DIAGNOSIS — Z79899 Other long term (current) drug therapy: Secondary | ICD-10-CM | POA: Insufficient documentation

## 2017-09-04 DIAGNOSIS — M5134 Other intervertebral disc degeneration, thoracic region: Secondary | ICD-10-CM

## 2017-09-04 DIAGNOSIS — N200 Calculus of kidney: Secondary | ICD-10-CM | POA: Insufficient documentation

## 2017-09-04 DIAGNOSIS — K5730 Diverticulosis of large intestine without perforation or abscess without bleeding: Secondary | ICD-10-CM | POA: Diagnosis not present

## 2017-09-04 DIAGNOSIS — B0229 Other postherpetic nervous system involvement: Secondary | ICD-10-CM | POA: Insufficient documentation

## 2017-09-04 DIAGNOSIS — K449 Diaphragmatic hernia without obstruction or gangrene: Secondary | ICD-10-CM | POA: Insufficient documentation

## 2017-09-04 DIAGNOSIS — F1190 Opioid use, unspecified, uncomplicated: Secondary | ICD-10-CM

## 2017-09-04 DIAGNOSIS — N400 Enlarged prostate without lower urinary tract symptoms: Secondary | ICD-10-CM | POA: Diagnosis not present

## 2017-09-04 DIAGNOSIS — G8929 Other chronic pain: Secondary | ICD-10-CM

## 2017-09-04 DIAGNOSIS — M545 Low back pain: Secondary | ICD-10-CM | POA: Diagnosis present

## 2017-09-04 DIAGNOSIS — M546 Pain in thoracic spine: Secondary | ICD-10-CM

## 2017-09-04 DIAGNOSIS — G894 Chronic pain syndrome: Secondary | ICD-10-CM | POA: Diagnosis not present

## 2017-09-04 DIAGNOSIS — Z79891 Long term (current) use of opiate analgesic: Secondary | ICD-10-CM | POA: Diagnosis not present

## 2017-09-04 MED ORDER — HYDROCODONE-ACETAMINOPHEN 7.5-325 MG PO TABS: 1.0000 | ORAL_TABLET | Freq: Two times a day (BID) | ORAL | 0 refills | Status: DC

## 2017-09-04 NOTE — Progress Notes (Signed)
Nursing Pain Medication Assessment:  Safety precautions to be maintained throughout the outpatient stay will include: orient to surroundings, keep bed in low position, maintain call bell within reach at all times, provide assistance with transfer out of bed and ambulation.  Medication Inspection Compliance: Pill count conducted under aseptic conditions, in front of the patient. Neither the pills nor the bottle was removed from the patient's sight at any time. Once count was completed pills were immediately returned to the patient in their original bottle.  Medication: Hydrocodone/APAP Pill/Patch Count: 20 of 90 pills remain Pill/Patch Appearance: Markings consistent with prescribed medication Bottle Appearance: Standard pharmacy container. Clearly labeled. Filled Date: 08 / 05 / 2019 Last Medication intake:  Yesterday

## 2017-09-04 NOTE — Progress Notes (Signed)
Subjective:  Patient ID: Timothy Knapp, male    DOB: 27-Feb-1940  Age: 77 y.o. MRN: [id number]  CC: Back Pain (lower lumbar, right is worse. )   Procedure: None  HPI Timothy Knapp presents for reevaluation.  He was last seen 2 months ago and is been doing well with his current regimen and able to actually reduce his dosing to about once a day on his Vicodin.  He is using the medications as prescribed and they are giving him good relief of the pain especially of the neuralgic constant lower lumbar pain.  Is also taking his other medications including ibuprofen and capsaican cream.  Combination is helping his pain and keeping it under reasonable control.  He is sleeping better at night.  Based on his narcotic assessment sheet he continues to derive good functional lifestyle improvement with his medications.  Outpatient Medications Prior to Visit  Medication Sig Dispense Refill  . Ascorbic Acid (VITAMIN C) 1000 MG tablet Take 1,000 mg by mouth daily.    [name] atorvastatin (LIPITOR) 40 MG tablet Take 1 tablet (40 mg total) by mouth at bedtime. 90 tablet 4  . b complex vitamins tablet Take 1 tablet by mouth daily.     . benazepril (LOTENSIN) 20 MG tablet Take 1 tablet (20 mg total) by mouth daily. 90 tablet 4  . Capsaicin-Menthol (CAPZASIN QUICK RELIEF) 0.025-10 % GEL Apply topically.    . cholecalciferol (VITAMIN D) 1000 units tablet Take 1,000 Units by mouth daily.    . Ginger, Zingiber officinalis, (GINGER ROOT) 550 MG CAPS Take 1 capsule by mouth daily.    [name] ibuprofen (ADVIL,MOTRIN) 200 MG tablet Take 200 mg by mouth every 8 (eight) hours as needed.    . Magnesium Citrate 200 MG TABS Take by mouth.    . NON FORMULARY Place under the tongue daily. 5 drops daily    . Selenium 200 MCG CAPS Take by mouth.    . Turmeric 500 MG CAPS Take 500 mg daily by mouth. Reported on 05/30/2015    . HYDROcodone-acetaminophen (NORCO) 7.5-325 MG tablet Take 1 tablet by mouth 3 (three) times daily. 90 tablet 0  .  diclofenac sodium (VOLTAREN) 1 % GEL APP 2 GRAMS EXT AA QID  3  . lidocaine (LIDODERM) 5 % Apply 1 to 2 patches to painful areas of the skin for 12 hours then remove for 12 hours if tolerated and repeat process    . Menaquinone-7 (VITAMIN K2 PO) Take 100 mcg by mouth daily.    . tamsulosin (FLOMAX) 0.4 MG CAPS capsule TAKE 1 CAPSULE(0.4 MG) BY MOUTH DAILY (Patient not taking: Reported on 08/21/2017) 30 capsule 0  . terbinafine (LAMISIL) 250 MG tablet TK 1 T PO D  2   No facility-administered medications prior to visit.     Review of Systems CNS: No confusion or sedation Cardiac: No angina or palpitations GI: No abdominal pain or constipation Institutional: No nausea vomiting fevers or chills  Objective:  BP 137/74 (BP Location: Left Arm, Patient Position: Sitting, Cuff Size: Normal)   Pulse (!) 58   Temp 97.9 F (36.6 C) (Oral)   Resp 16   Ht 5\' 7"  (1.702 m)   Wt 165 lb (74.8 kg)   SpO2 95%   BMI 25.84 kg/m    BP Readings from Last 3 Encounters:  09/04/17 137/74  08/21/17 (!) 147/75  07/08/17 124/70     Wt Readings from Last 3 Encounters:  09/04/17 165 lb (74.8 kg)  08/21/17 160 lb 11.2 oz (72.9 kg)  07/08/17 155 lb (70.3 kg)     Physical Exam Pt is alert and oriented PERRL EOMI HEART IS RRR no murmur or rub LCTA no wheezing or rales MUSCULOSKELETAL reveals some persistent paraspinous muscle tenderness.  He is ambulating well he has good muscle tone and bulk to the lower extremities.  Labs  No results found for: HGBA1C Lab Results  Component Value Date   LDLCALC 106 (H) 01/28/2017   CREATININE 0.96 03/29/2017    -------------------------------------------------------------------------------------------------------------------- Lab Results  Component Value Date   WBC 7.4 03/29/2017   HGB 14.3 03/29/2017   HCT 43.5 03/29/2017   PLT 241 03/29/2017   GLUCOSE 121 (H) 03/29/2017   CHOL 185 01/28/2017   TRIG 174 (H) 01/28/2017   HDL 44 01/28/2017   LDLCALC  106 (H) 01/28/2017   ALT 18 01/28/2017   AST 29 01/28/2017   NA 136 03/29/2017   K 3.9 03/29/2017   CL 103 03/29/2017   CREATININE 0.96 03/29/2017   BUN 14 03/29/2017   CO2 24 03/29/2017   TSH 2.490 12/05/2015   INR 0.95 11/28/2016    --------------------------------------------------------------------------------------------------------------------- Ct Abdomen Pelvis W Contrast  Result Date: 03/29/2017 CLINICAL DATA:  Hematuria EXAM: CT ABDOMEN AND PELVIS WITH CONTRAST TECHNIQUE: Multidetector CT imaging of the abdomen and pelvis was performed using the standard protocol following bolus administration of intravenous contrast. CONTRAST:  146mL ISOVUE-300 IOPAMIDOL (ISOVUE-300) INJECTION 61% COMPARISON:  04/25/2013 FINDINGS: Lower chest: Punctate granuloma in the right middle lobe. No acute consolidation or pleural effusion. Normal heart size. Small hiatal hernia. Hepatobiliary: No focal liver abnormality is seen. No gallstones, gallbladder wall thickening, or biliary dilatation. Pancreas: Unremarkable. No pancreatic ductal dilatation or surrounding inflammatory changes. Spleen: Normal in size without focal abnormality. Adrenals/Urinary Tract: Adrenal glands are within normal limits. Punctate nonobstructing stones in the right kidney. Subcentimeter hypodense renal lesions too small to further characterize. No hydronephrosis. Bladder unremarkable. Stomach/Bowel: Stomach is within normal limits. Appendix appears normal. No evidence of bowel wall thickening, distention, or inflammatory changes. Sigmoid colon diverticular disease without acute inflammation Vascular/Lymphatic: Moderate aortic atherosclerosis. No aneurysmal dilatation. No significantly enlarged lymph nodes. Reproductive: Enlarged prostate with calcification Other: Negative for free air or free fluid. Small fat in the umbilical region Musculoskeletal: Degenerative changes. No acute or suspicious lesion. IMPRESSION: 1. No CT evidence for  acute intra-abdominal or pelvic abnormality. 2. Punctate nonobstructing stones in the right kidney. 3. Sigmoid colon diverticular disease without acute inflammation 4. Enlarged prostate Electronically Signed   By: Donavan Foil M.D.   On: 03/29/2017 23:20     Assessment & Plan:   Timothy Knapp was seen today for back pain.  Diagnoses and all orders for this visit:  Postherpetic neuralgia  Chronic right-sided thoracic back pain  DDD (degenerative disc disease), thoracic  Chronic, continuous use of opioids  Chronic pain syndrome  Other orders -     Discontinue: HYDROcodone-acetaminophen (NORCO) 7.5-325 MG tablet; Take 1 tablet by mouth 2 (two) times daily. -     HYDROcodone-acetaminophen (NORCO) 7.5-325 MG tablet; Take 1 tablet by mouth 2 (two) times daily.        ----------------------------------------------------------------------------------------------------------------------  Problem List Items Addressed This Visit      Unprioritized   Back pain   Relevant Medications   HYDROcodone-acetaminophen (NORCO) 7.5-325 MG tablet   DDD (degenerative disc disease), thoracic   Relevant Medications   HYDROcodone-acetaminophen (NORCO) 7.5-325 MG tablet   Postherpetic neuralgia - Primary    Other Visit Diagnoses  Chronic, continuous use of opioids       Chronic pain syndrome            ----------------------------------------------------------------------------------------------------------------------  1. Postherpetic neuralgia Please that he is able to reduce his current opioid dosing.  As such we will give refills for August 30 and November 30 for 45 tablets which will allow him up to a twice daily dosing.  We have reviewed the [facility] practitioner database information and it is also appropriate.  2. Chronic right-sided thoracic back pain To new current medications with return to clinic in 2 months  3. DDD (degenerative disc disease), thoracic   4. Chronic,  continuous use of opioids   5. Chronic pain syndrome     ----------------------------------------------------------------------------------------------------------------------  I am having Timothy Kalata "Ed" maintain his b complex vitamins, Turmeric, atorvastatin, benazepril, NON FORMULARY, cholecalciferol, vitamin C, Ginger Root, ibuprofen, Menaquinone-7 (VITAMIN K2 PO), tamsulosin, terbinafine, Capsaicin-Menthol, diclofenac sodium, lidocaine, Selenium, Magnesium Citrate, and HYDROcodone-acetaminophen.   Meds ordered this encounter  Medications  . DISCONTD: HYDROcodone-acetaminophen (NORCO) 7.5-325 MG tablet    Sig: Take 1 tablet by mouth 2 (two) times daily.    Dispense:  45 tablet    Refill:  0    Do not fill until [id number]  . HYDROcodone-acetaminophen (NORCO) 7.5-325 MG tablet    Sig: Take 1 tablet by mouth 2 (two) times daily.    Dispense:  45 tablet    Refill:  0    Do not fill until [id number]   Patient's Medications  New Prescriptions   No medications on file  Previous Medications   ASCORBIC ACID (VITAMIN C) 1000 MG TABLET    Take 1,000 mg by mouth daily.   ATORVASTATIN (LIPITOR) 40 MG TABLET    Take 1 tablet (40 mg total) by mouth at bedtime.   B COMPLEX VITAMINS TABLET    Take 1 tablet by mouth daily.    BENAZEPRIL (LOTENSIN) 20 MG TABLET    Take 1 tablet (20 mg total) by mouth daily.   CAPSAICIN-MENTHOL (CAPZASIN QUICK RELIEF) 0.025-10 % GEL    Apply topically.   CHOLECALCIFEROL (VITAMIN D) 1000 UNITS TABLET    Take 1,000 Units by mouth daily.   DICLOFENAC SODIUM (VOLTAREN) 1 % GEL    APP 2 GRAMS EXT AA QID   GINGER, ZINGIBER OFFICINALIS, (GINGER ROOT) 550 MG CAPS    Take 1 capsule by mouth daily.   IBUPROFEN (ADVIL,MOTRIN) 200 MG TABLET    Take 200 mg by mouth every 8 (eight) hours as needed.   LIDOCAINE (LIDODERM) 5 %    Apply 1 to 2 patches to painful areas of the skin for 12 hours then remove for 12 hours if tolerated and repeat process   MAGNESIUM CITRATE 200  MG TABS    Take by mouth.   MENAQUINONE-7 (VITAMIN K2 PO)    Take 100 mcg by mouth daily.   NON FORMULARY    Place under the tongue daily. 5 drops daily   SELENIUM 200 MCG CAPS    Take by mouth.   TAMSULOSIN (FLOMAX) 0.4 MG CAPS CAPSULE    TAKE 1 CAPSULE(0.4 MG) BY MOUTH DAILY   TERBINAFINE (LAMISIL) 250 MG TABLET    TK 1 T PO D   TURMERIC 500 MG CAPS    Take 500 mg daily by mouth. Reported on 05/30/2015  Modified Medications   Modified Medication Previous Medication   HYDROCODONE-ACETAMINOPHEN (NORCO) 7.5-325 MG TABLET HYDROcodone-acetaminophen (NORCO) 7.5-325 MG tablet  Take 1 tablet by mouth 2 (two) times daily.    Take 1 tablet by mouth 3 (three) times daily.  Discontinued Medications   No medications on file   ----------------------------------------------------------------------------------------------------------------------  Follow-up: Return in about 2 months (around 11/04/2017) for evaluation, med refill.    [name], MD

## 2017-09-04 NOTE — Patient Instructions (Signed)
Hydrocodone prescription given x 2

## 2017-10-16 ENCOUNTER — Ambulatory Visit: Payer: PPO | Admitting: Anesthesiology

## 2017-10-23 ENCOUNTER — Ambulatory Visit: Payer: PPO | Admitting: Anesthesiology

## 2017-10-30 ENCOUNTER — Ambulatory Visit: Payer: PPO | Attending: Anesthesiology | Admitting: Anesthesiology

## 2017-10-30 ENCOUNTER — Encounter: Payer: Self-pay | Admitting: Anesthesiology

## 2017-10-30 ENCOUNTER — Other Ambulatory Visit: Payer: Self-pay

## 2017-10-30 VITALS — BP 128/76 | HR 56 | Temp 98.2°F | Resp 18 | Ht 67.0 in | Wt 160.0 lb

## 2017-10-30 DIAGNOSIS — G8929 Other chronic pain: Secondary | ICD-10-CM | POA: Diagnosis not present

## 2017-10-30 DIAGNOSIS — B0229 Other postherpetic nervous system involvement: Secondary | ICD-10-CM | POA: Diagnosis not present

## 2017-10-30 DIAGNOSIS — M5134 Other intervertebral disc degeneration, thoracic region: Secondary | ICD-10-CM | POA: Insufficient documentation

## 2017-10-30 DIAGNOSIS — M546 Pain in thoracic spine: Secondary | ICD-10-CM

## 2017-10-30 DIAGNOSIS — Z79891 Long term (current) use of opiate analgesic: Secondary | ICD-10-CM | POA: Diagnosis not present

## 2017-10-30 DIAGNOSIS — G894 Chronic pain syndrome: Secondary | ICD-10-CM

## 2017-10-30 DIAGNOSIS — Z79899 Other long term (current) drug therapy: Secondary | ICD-10-CM | POA: Diagnosis not present

## 2017-10-30 DIAGNOSIS — M545 Low back pain: Secondary | ICD-10-CM | POA: Diagnosis present

## 2017-10-30 DIAGNOSIS — F1190 Opioid use, unspecified, uncomplicated: Secondary | ICD-10-CM

## 2017-10-30 MED ORDER — HYDROCODONE-ACETAMINOPHEN 7.5-325 MG PO TABS: 1.0000 | ORAL_TABLET | Freq: Two times a day (BID) | ORAL | 0 refills | Status: DC

## 2017-10-30 NOTE — Progress Notes (Signed)
Nursing Pain Medication Assessment:  Safety precautions to be maintained throughout the outpatient stay will include: orient to surroundings, keep bed in low position, maintain call bell within reach at all times, provide assistance with transfer out of bed and ambulation.  Medication Inspection Compliance: Pill count conducted under aseptic conditions, in front of the patient. Neither the pills nor the bottle was removed from the patient's sight at any time. Once count was completed pills were immediately returned to the patient in their original bottle.  Medication: Hydrocodone/APAP Pill/Patch Count: 14 of 45 pills remain Pill/Patch Appearance: Markings consistent with prescribed medication Bottle Appearance: Standard pharmacy container. Clearly labeled. Filled Date: 10 / 03 / 2019 Last Medication intake:  Today

## 2017-10-30 NOTE — Progress Notes (Signed)
Subjective:  Patient ID: Timothy Knapp, male    DOB: 1940-04-12  Age: 77 y.o. MRN: [id number]  CC: No chief complaint on file.   Procedure: None  HPI Timothy Knapp presents for reevaluation.  He was last seen 2 months ago and continues to do well with his low back pain and postherpetic neuralgia.  He is taking his medications sparingly and using approximately 1.25 of the 5 mg hydrocodone per day.  He generally gets 45/month and this is working well for him.  He is also taking his other medications including the capsaicin cream and the Lidoderm patch in the past.  This combination seems to keep his pain under good control and he is continuing efforts at weaning his opioid medications.  Otherwise he is in his usual state of health.  He furthermore mentions that his right knee pain following an arthroplasty approximately 1 year ago is been improving as well.  No changes in lower extremity strength or function are noted.  Based on his narcotic assessment sheet he continues to derive good functional lifestyle improvement with the medications.  Outpatient Medications Prior to Visit  Medication Sig Dispense Refill  . atorvastatin (LIPITOR) 40 MG tablet Take 1 tablet (40 mg total) by mouth at bedtime. 90 tablet 4  . b complex vitamins tablet Take 1 tablet by mouth daily.     . benazepril (LOTENSIN) 20 MG tablet Take 1 tablet (20 mg total) by mouth daily. 90 tablet 4  . Capsaicin-Menthol (CAPZASIN QUICK RELIEF) 0.025-10 % GEL Apply topically.    . cholecalciferol (VITAMIN D) 1000 units tablet Take 1,000 Units by mouth daily.    . diclofenac sodium (VOLTAREN) 1 % GEL APP 2 GRAMS EXT AA QID  3  . naproxen (NAPROSYN) 250 MG tablet Take by mouth 2 (two) times daily with a meal.    . HYDROcodone-acetaminophen (NORCO) 7.5-325 MG tablet Take 1 tablet by mouth 2 (two) times daily. 45 tablet 0  . Ascorbic Acid (VITAMIN C) 1000 MG tablet Take 1,000 mg by mouth daily.    . Ginger, Zingiber officinalis, (GINGER  ROOT) 550 MG CAPS Take 1 capsule by mouth daily.    [name] ibuprofen (ADVIL,MOTRIN) 200 MG tablet Take 200 mg by mouth every 8 (eight) hours as needed.    . lidocaine (LIDODERM) 5 % Apply 1 to 2 patches to painful areas of the skin for 12 hours then remove for 12 hours if tolerated and repeat process    . Magnesium Citrate 200 MG TABS Take by mouth.    . Menaquinone-7 (VITAMIN K2 PO) Take 100 mcg by mouth daily.    . NON FORMULARY Place under the tongue daily. 5 drops daily    . Selenium 200 MCG CAPS Take by mouth.    . tamsulosin (FLOMAX) 0.4 MG CAPS capsule TAKE 1 CAPSULE(0.4 MG) BY MOUTH DAILY (Patient not taking: Reported on 08/21/2017) 30 capsule 0  . terbinafine (LAMISIL) 250 MG tablet TK 1 T PO D  2  . Turmeric 500 MG CAPS Take 500 mg daily by mouth. Reported on 05/30/2015     No facility-administered medications prior to visit.     Review of Systems CNS: No confusion or sedation Cardiac: No angina or palpitations GI: No abdominal pain or constipation Constitutional: No nausea vomiting fevers or chills  Objective:  BP 128/76   Pulse (!) 56   Temp 98.2 F (36.8 C) (Oral)   Resp 18   Ht 5\' 7"  (1.702 m)  Wt 160 lb (72.6 kg)   SpO2 96%   BMI 25.06 kg/m    BP Readings from Last 3 Encounters:  10/30/17 128/76  09/04/17 137/74  08/21/17 (!) 147/75     Wt Readings from Last 3 Encounters:  10/30/17 160 lb (72.6 kg)  09/04/17 165 lb (74.8 kg)  08/21/17 160 lb 11.2 oz (72.9 kg)     Physical Exam Pt is alert and oriented PERRL EOMI HEART IS RRR no murmur or rub He is ambulating with a minimally antalgic gait and his muscle tone and bulk appears to be at baseline.  Labs  No results found for: HGBA1C Lab Results  Component Value Date   LDLCALC 106 (H) 01/28/2017   CREATININE 0.96 03/29/2017    -------------------------------------------------------------------------------------------------------------------- Lab Results  Component Value Date   WBC 7.4 03/29/2017    HGB 14.3 03/29/2017   HCT 43.5 03/29/2017   PLT 241 03/29/2017   GLUCOSE 121 (H) 03/29/2017   CHOL 185 01/28/2017   TRIG 174 (H) 01/28/2017   HDL 44 01/28/2017   LDLCALC 106 (H) 01/28/2017   ALT 18 01/28/2017   AST 29 01/28/2017   NA 136 03/29/2017   K 3.9 03/29/2017   CL 103 03/29/2017   CREATININE 0.96 03/29/2017   BUN 14 03/29/2017   CO2 24 03/29/2017   TSH 2.490 12/05/2015   INR 0.95 11/28/2016    --------------------------------------------------------------------------------------------------------------------- Ct Abdomen Pelvis W Contrast  Result Date: 03/29/2017 CLINICAL DATA:  Hematuria EXAM: CT ABDOMEN AND PELVIS WITH CONTRAST TECHNIQUE: Multidetector CT imaging of the abdomen and pelvis was performed using the standard protocol following bolus administration of intravenous contrast. CONTRAST:  163mL ISOVUE-300 IOPAMIDOL (ISOVUE-300) INJECTION 61% COMPARISON:  04/25/2013 FINDINGS: Lower chest: Punctate granuloma in the right middle lobe. No acute consolidation or pleural effusion. Normal heart size. Small hiatal hernia. Hepatobiliary: No focal liver abnormality is seen. No gallstones, gallbladder wall thickening, or biliary dilatation. Pancreas: Unremarkable. No pancreatic ductal dilatation or surrounding inflammatory changes. Spleen: Normal in size without focal abnormality. Adrenals/Urinary Tract: Adrenal glands are within normal limits. Punctate nonobstructing stones in the right kidney. Subcentimeter hypodense renal lesions too small to further characterize. No hydronephrosis. Bladder unremarkable. Stomach/Bowel: Stomach is within normal limits. Appendix appears normal. No evidence of bowel wall thickening, distention, or inflammatory changes. Sigmoid colon diverticular disease without acute inflammation Vascular/Lymphatic: Moderate aortic atherosclerosis. No aneurysmal dilatation. No significantly enlarged lymph nodes. Reproductive: Enlarged prostate with calcification Other:  Negative for free air or free fluid. Small fat in the umbilical region Musculoskeletal: Degenerative changes. No acute or suspicious lesion. IMPRESSION: 1. No CT evidence for acute intra-abdominal or pelvic abnormality. 2. Punctate nonobstructing stones in the right kidney. 3. Sigmoid colon diverticular disease without acute inflammation 4. Enlarged prostate Electronically Signed   By: Donavan Foil M.D.   On: 03/29/2017 23:20     Assessment & Plan:   Diagnoses and all orders for this visit:  Postherpetic neuralgia  Chronic right-sided thoracic back pain  DDD (degenerative disc disease), thoracic  Chronic, continuous use of opioids  Chronic pain syndrome  Other orders -     Discontinue: HYDROcodone-acetaminophen (NORCO) 7.5-325 MG tablet; Take 1 tablet by mouth 2 (two) times daily. -     HYDROcodone-acetaminophen (NORCO) 7.5-325 MG tablet; Take 1 tablet by mouth 2 (two) times daily.        ----------------------------------------------------------------------------------------------------------------------  Problem List Items Addressed This Visit      Unprioritized   Back pain   Relevant Medications   naproxen (NAPROSYN)  250 MG tablet   HYDROcodone-acetaminophen (NORCO) 7.5-325 MG tablet   DDD (degenerative disc disease), thoracic   Relevant Medications   naproxen (NAPROSYN) 250 MG tablet   HYDROcodone-acetaminophen (NORCO) 7.5-325 MG tablet   Postherpetic neuralgia - Primary    Other Visit Diagnoses    Chronic, continuous use of opioids       Chronic pain syndrome            ----------------------------------------------------------------------------------------------------------------------  1. Postherpetic neuralgia We will keep him on his current regimen at this point.  He is doing well and I want him to continue efforts of personal wean on the opioids.  He is to return to clinic in 2 months for reevaluation and prescriptions were given today for November 2 and  December 1.  This is for 45 tablets.  We have reviewed the [facility] practitioner database information and it is appropriate.  2. Chronic right-sided thoracic back pain Continue follow-up with his primary care physicians  3. DDD (degenerative disc disease), thoracic   4. Chronic, continuous use of opioids   5. Chronic pain syndrome     ----------------------------------------------------------------------------------------------------------------------  I am having [name] "Ed" maintain his b complex vitamins, Turmeric, atorvastatin, benazepril, NON FORMULARY, cholecalciferol, vitamin C, Ginger Root, ibuprofen, Menaquinone-7 (VITAMIN K2 PO), tamsulosin, terbinafine, Capsaicin-Menthol, diclofenac sodium, lidocaine, Selenium, Magnesium Citrate, naproxen, and HYDROcodone-acetaminophen.   Meds ordered this encounter  Medications  . DISCONTD: HYDROcodone-acetaminophen (NORCO) 7.5-325 MG tablet    Sig: Take 1 tablet by mouth 2 (two) times daily.    Dispense:  45 tablet    Refill:  0    Do not fill until [id number]  . HYDROcodone-acetaminophen (NORCO) 7.5-325 MG tablet    Sig: Take 1 tablet by mouth 2 (two) times daily.    Dispense:  45 tablet    Refill:  0    Do not fill until [id number]   Patient's Medications  New Prescriptions   No medications on file  Previous Medications   ASCORBIC ACID (VITAMIN C) 1000 MG TABLET    Take 1,000 mg by mouth daily.   ATORVASTATIN (LIPITOR) 40 MG TABLET    Take 1 tablet (40 mg total) by mouth at bedtime.   B COMPLEX VITAMINS TABLET    Take 1 tablet by mouth daily.    BENAZEPRIL (LOTENSIN) 20 MG TABLET    Take 1 tablet (20 mg total) by mouth daily.   CAPSAICIN-MENTHOL (CAPZASIN QUICK RELIEF) 0.025-10 % GEL    Apply topically.   CHOLECALCIFEROL (VITAMIN D) 1000 UNITS TABLET    Take 1,000 Units by mouth daily.   DICLOFENAC SODIUM (VOLTAREN) 1 % GEL    APP 2 GRAMS EXT AA QID   GINGER, ZINGIBER OFFICINALIS, (GINGER ROOT) 550 MG CAPS     Take 1 capsule by mouth daily.   IBUPROFEN (ADVIL,MOTRIN) 200 MG TABLET    Take 200 mg by mouth every 8 (eight) hours as needed.   LIDOCAINE (LIDODERM) 5 %    Apply 1 to 2 patches to painful areas of the skin for 12 hours then remove for 12 hours if tolerated and repeat process   MAGNESIUM CITRATE 200 MG TABS    Take by mouth.   MENAQUINONE-7 (VITAMIN K2 PO)    Take 100 mcg by mouth daily.   NAPROXEN (NAPROSYN) 250 MG TABLET    Take by mouth 2 (two) times daily with a meal.   NON FORMULARY    Place under the tongue daily. 5 drops daily  SELENIUM 200 MCG CAPS    Take by mouth.   TAMSULOSIN (FLOMAX) 0.4 MG CAPS CAPSULE    TAKE 1 CAPSULE(0.4 MG) BY MOUTH DAILY   TERBINAFINE (LAMISIL) 250 MG TABLET    TK 1 T PO D   TURMERIC 500 MG CAPS    Take 500 mg daily by mouth. Reported on 05/30/2015  Modified Medications   Modified Medication Previous Medication   HYDROCODONE-ACETAMINOPHEN (NORCO) 7.5-325 MG TABLET HYDROcodone-acetaminophen (NORCO) 7.5-325 MG tablet      Take 1 tablet by mouth 2 (two) times daily.    Take 1 tablet by mouth 2 (two) times daily.  Discontinued Medications   No medications on file   ----------------------------------------------------------------------------------------------------------------------  Follow-up: Return in about 2 months (around 12/30/2017) for evaluation, med refill.    [name], MD

## 2017-10-30 NOTE — Patient Instructions (Signed)
Paper scripts X 2 handed to patient. To last until 01-07-2018. Hydrocodone.

## 2017-11-05 ENCOUNTER — Ambulatory Visit (INDEPENDENT_AMBULATORY_CARE_PROVIDER_SITE_OTHER): Payer: PPO | Admitting: Family Medicine

## 2017-11-05 ENCOUNTER — Encounter: Payer: Self-pay | Admitting: Family Medicine

## 2017-11-05 VITALS — BP 108/62 | HR 59 | Temp 97.8°F | Ht 67.0 in | Wt 160.5 lb

## 2017-11-05 DIAGNOSIS — M26621 Arthralgia of right temporomandibular joint: Secondary | ICD-10-CM

## 2017-11-05 MED ORDER — CYCLOBENZAPRINE HCL 5 MG PO TABS: 5.0000 mg | ORAL_TABLET | Freq: Three times a day (TID) | ORAL | 1 refills | Status: DC | PRN

## 2017-11-05 NOTE — Progress Notes (Signed)
   BP 108/62 (BP Location: Left Arm, Patient Position: Sitting, Cuff Size: Normal)   Pulse (!) 59   Temp 97.8 F (36.6 C) (Oral)   Ht 5\' 7"  (1.702 m)   Wt 160 lb 8 oz (72.8 kg)   SpO2 95%   BMI 25.14 kg/m    Subjective:    Patient ID: Timothy Knapp, male    DOB: 02-Sep-1940, 77 y.o.   MRN: [id number]  HPI: Timothy Knapp is a 77 y.o. male  Chief Complaint  Patient presents with  . Jaw Pain    Right side. Ongoing for 1 month.    Here today for right jaw pain and clicking for about a month. Worse with chewing and yawning. Aching pain. Not trying anything for sxs. No known injury, does not clench teeth. Has been to the dentist recently and everything was fine there. Denies fevers, chills, redness, swelling.  Relevant past medical, surgical, family and social history reviewed and updated as indicated. Interim medical history since our last visit reviewed. Allergies and medications reviewed and updated.  Review of Systems  Per HPI unless specifically indicated above     Objective:    BP 108/62 (BP Location: Left Arm, Patient Position: Sitting, Cuff Size: Normal)   Pulse (!) 59   Temp 97.8 F (36.6 C) (Oral)   Ht 5\' 7"  (1.702 m)   Wt 160 lb 8 oz (72.8 kg)   SpO2 95%   BMI 25.14 kg/m   Wt Readings from Last 3 Encounters:  11/05/17 160 lb 8 oz (72.8 kg)  10/30/17 160 lb (72.6 kg)  09/04/17 165 lb (74.8 kg)    Physical Exam  Constitutional: He is oriented to person, place, and time. He appears well-developed and well-nourished. No distress.  HENT:  Head: Atraumatic.  Eyes: Conjunctivae and EOM are normal.  Neck: Normal range of motion. Neck supple.  Cardiovascular: Normal rate and regular rhythm.  Pulmonary/Chest: Effort normal and breath sounds normal.  Musculoskeletal: Normal range of motion.  TTP over right TMJ with palpable crepitus with opening and closing of jaw  Neurological: He is alert and oriented to person, place, and time.  Skin: Skin is warm and dry.    Psychiatric: He has a normal mood and affect. His behavior is normal.  Nursing note and vitals reviewed.   Results for orders placed or performed in visit on 08/21/17  PSA  Result Value Ref Range   Prostate Specific Ag, Serum 4.8 (H) 0.0 - 4.0 ng/mL      Assessment & Plan:   Problem List Items Addressed This Visit    None    Visit Diagnoses    Arthralgia of right temporomandibular joint    -  Primary   Low dose flexeril given w/ sedation precautions, continue naproxen, change pillow/sleeping position. Diclofenac topically, warm compresses, massage, mouth guard   Relevant Medications   cyclobenzaprine (FLEXERIL) 5 MG tablet       Follow up plan: Return if symptoms worsen or fail to improve.

## 2017-11-06 ENCOUNTER — Telehealth: Payer: Self-pay

## 2017-11-06 NOTE — Telephone Encounter (Signed)
PA initiated for Cyclobenazeprine via Cover My Meds. Key: [id number].

## 2017-11-07 NOTE — Telephone Encounter (Signed)
PA approved.

## 2017-11-07 NOTE — Patient Instructions (Signed)
Follow up as needed

## 2017-12-10 DIAGNOSIS — Z96651 Presence of right artificial knee joint: Secondary | ICD-10-CM | POA: Diagnosis not present

## 2017-12-10 DIAGNOSIS — M25561 Pain in right knee: Secondary | ICD-10-CM | POA: Diagnosis not present

## 2017-12-25 ENCOUNTER — Other Ambulatory Visit: Payer: Self-pay

## 2017-12-25 ENCOUNTER — Ambulatory Visit: Payer: PPO | Attending: Anesthesiology | Admitting: Anesthesiology

## 2017-12-25 ENCOUNTER — Encounter: Payer: Self-pay | Admitting: Anesthesiology

## 2017-12-25 VITALS — BP 137/74 | HR 70 | Temp 97.5°F | Resp 16 | Ht 67.0 in | Wt 160.0 lb

## 2017-12-25 DIAGNOSIS — B0229 Other postherpetic nervous system involvement: Secondary | ICD-10-CM | POA: Insufficient documentation

## 2017-12-25 DIAGNOSIS — M546 Pain in thoracic spine: Secondary | ICD-10-CM

## 2017-12-25 DIAGNOSIS — F1190 Opioid use, unspecified, uncomplicated: Secondary | ICD-10-CM | POA: Diagnosis not present

## 2017-12-25 DIAGNOSIS — Z760 Encounter for issue of repeat prescription: Secondary | ICD-10-CM | POA: Insufficient documentation

## 2017-12-25 DIAGNOSIS — Z79899 Other long term (current) drug therapy: Secondary | ICD-10-CM | POA: Diagnosis not present

## 2017-12-25 DIAGNOSIS — G894 Chronic pain syndrome: Secondary | ICD-10-CM | POA: Diagnosis not present

## 2017-12-25 DIAGNOSIS — K5730 Diverticulosis of large intestine without perforation or abscess without bleeding: Secondary | ICD-10-CM | POA: Insufficient documentation

## 2017-12-25 DIAGNOSIS — N400 Enlarged prostate without lower urinary tract symptoms: Secondary | ICD-10-CM | POA: Diagnosis not present

## 2017-12-25 DIAGNOSIS — Z79891 Long term (current) use of opiate analgesic: Secondary | ICD-10-CM | POA: Diagnosis not present

## 2017-12-25 DIAGNOSIS — Z791 Long term (current) use of non-steroidal anti-inflammatories (NSAID): Secondary | ICD-10-CM | POA: Diagnosis not present

## 2017-12-25 DIAGNOSIS — N200 Calculus of kidney: Secondary | ICD-10-CM | POA: Diagnosis not present

## 2017-12-25 DIAGNOSIS — G8929 Other chronic pain: Secondary | ICD-10-CM

## 2017-12-25 DIAGNOSIS — K449 Diaphragmatic hernia without obstruction or gangrene: Secondary | ICD-10-CM | POA: Diagnosis not present

## 2017-12-25 DIAGNOSIS — M5134 Other intervertebral disc degeneration, thoracic region: Secondary | ICD-10-CM | POA: Diagnosis not present

## 2017-12-25 MED ORDER — HYDROCODONE-ACETAMINOPHEN 7.5-325 MG PO TABS: 1.0000 | ORAL_TABLET | Freq: Every day | ORAL | 0 refills | Status: AC

## 2017-12-25 MED ORDER — HYDROCODONE-ACETAMINOPHEN 7.5-325 MG PO TABS: 1.0000 | ORAL_TABLET | Freq: Every day | ORAL | 0 refills | Status: DC

## 2017-12-25 NOTE — Progress Notes (Signed)
Subjective:  Patient ID: Timothy Knapp, male    DOB: 1941/01/09  Age: 77 y.o. MRN: [id number]  CC: Medication Refill (Hydrocodone/acetaminophen)   Procedure: None  HPI Timothy Knapp presents for reevaluation.  He is making some progress and desires to reduce his oxycodone usage to once a day dosing.  The quality of his pain has remained stable in nature.  He still taking his capsaicin cream and the oxycodone for breakthrough pain.  Based on his narcotic assessment sheet he continues to get good relief with the medicines with no untoward side effects.  Otherwise he is in his usual state of health no new changes in upper or lower extremity strength or function.  The quality the pain is remained stable.  Outpatient Medications Prior to Visit  Medication Sig Dispense Refill  . atorvastatin (LIPITOR) 40 MG tablet Take 1 tablet (40 mg total) by mouth at bedtime. 90 tablet 4  . b complex vitamins tablet Take 1 tablet by mouth daily.     . benazepril (LOTENSIN) 20 MG tablet Take 1 tablet (20 mg total) by mouth daily. 90 tablet 4  . Capsaicin-Menthol (CAPZASIN QUICK RELIEF) 0.025-10 % GEL Apply topically.    . cholecalciferol (VITAMIN D) 1000 units tablet Take 1,000 Units by mouth daily.    . diclofenac sodium (VOLTAREN) 1 % GEL APP 2 GRAMS EXT AA QID  3  . HYDROcodone-acetaminophen (NORCO) 7.5-325 MG tablet Take 1 tablet by mouth 2 (two) times daily. 45 tablet 0  . cyclobenzaprine (FLEXERIL) 5 MG tablet Take 1 tablet (5 mg total) by mouth 3 (three) times daily as needed for muscle spasms. (Patient not taking: Reported on 12/25/2017) 30 tablet 1  . naproxen (NAPROSYN) 250 MG tablet Take by mouth 2 (two) times daily with a meal.    . NON FORMULARY Place under the tongue daily. 5 drops daily     No facility-administered medications prior to visit.     Review of Systems CNS: No confusion or sedation Cardiac: No angina or palpitations GI: No abdominal pain or constipation Constitutional: No  nausea vomiting fevers or chills  Objective:  BP 137/74   Pulse 70   Temp (!) 97.5 F (36.4 C) (Oral)   Resp 16   Ht 5\' 7"  (1.702 m)   Wt 160 lb (72.6 kg)   SpO2 95%   BMI 25.06 kg/m    BP Readings from Last 3 Encounters:  12/25/17 137/74  11/05/17 108/62  10/30/17 128/76     Wt Readings from Last 3 Encounters:  12/25/17 160 lb (72.6 kg)  11/05/17 160 lb 8 oz (72.8 kg)  10/30/17 160 lb (72.6 kg)     Physical Exam Pt is alert and oriented PERRL EOMI HEART IS RRR no murmur Musculoskeletal: He is moving about well  Labs  No results found for: HGBA1C Lab Results  Component Value Date   LDLCALC 106 (H) 01/28/2017   CREATININE 0.96 03/29/2017    -------------------------------------------------------------------------------------------------------------------- Lab Results  Component Value Date   WBC 7.4 03/29/2017   HGB 14.3 03/29/2017   HCT 43.5 03/29/2017   PLT 241 03/29/2017   GLUCOSE 121 (H) 03/29/2017   CHOL 185 01/28/2017   TRIG 174 (H) 01/28/2017   HDL 44 01/28/2017   LDLCALC 106 (H) 01/28/2017   ALT 18 01/28/2017   AST 29 01/28/2017   NA 136 03/29/2017   K 3.9 03/29/2017   CL 103 03/29/2017   CREATININE 0.96 03/29/2017   BUN 14 03/29/2017  CO2 24 03/29/2017   TSH 2.490 12/05/2015   INR 0.95 11/28/2016    --------------------------------------------------------------------------------------------------------------------- Ct Abdomen Pelvis W Contrast  Result Date: 03/29/2017 CLINICAL DATA:  Hematuria EXAM: CT ABDOMEN AND PELVIS WITH CONTRAST TECHNIQUE: Multidetector CT imaging of the abdomen and pelvis was performed using the standard protocol following bolus administration of intravenous contrast. CONTRAST:  126mL ISOVUE-300 IOPAMIDOL (ISOVUE-300) INJECTION 61% COMPARISON:  04/25/2013 FINDINGS: Lower chest: Punctate granuloma in the right middle lobe. No acute consolidation or pleural effusion. Normal heart size. Small hiatal hernia.  Hepatobiliary: No focal liver abnormality is seen. No gallstones, gallbladder wall thickening, or biliary dilatation. Pancreas: Unremarkable. No pancreatic ductal dilatation or surrounding inflammatory changes. Spleen: Normal in size without focal abnormality. Adrenals/Urinary Tract: Adrenal glands are within normal limits. Punctate nonobstructing stones in the right kidney. Subcentimeter hypodense renal lesions too small to further characterize. No hydronephrosis. Bladder unremarkable. Stomach/Bowel: Stomach is within normal limits. Appendix appears normal. No evidence of bowel wall thickening, distention, or inflammatory changes. Sigmoid colon diverticular disease without acute inflammation Vascular/Lymphatic: Moderate aortic atherosclerosis. No aneurysmal dilatation. No significantly enlarged lymph nodes. Reproductive: Enlarged prostate with calcification Other: Negative for free air or free fluid. Small fat in the umbilical region Musculoskeletal: Degenerative changes. No acute or suspicious lesion. IMPRESSION: 1. No CT evidence for acute intra-abdominal or pelvic abnormality. 2. Punctate nonobstructing stones in the right kidney. 3. Sigmoid colon diverticular disease without acute inflammation 4. Enlarged prostate Electronically Signed   By: Donavan Foil M.D.   On: 03/29/2017 23:20     Assessment & Plan:   Timothy Knapp was seen today for medication refill.  Diagnoses and all orders for this visit:  Postherpetic neuralgia  Chronic right-sided thoracic back pain  DDD (degenerative disc disease), thoracic  Chronic, continuous use of opioids  Chronic pain syndrome  Other orders -     Discontinue: HYDROcodone-acetaminophen (NORCO) 7.5-325 MG tablet; Take 1 tablet by mouth daily. -     Discontinue: HYDROcodone-acetaminophen (NORCO) 7.5-325 MG tablet; Take 1 tablet by mouth daily. -     HYDROcodone-acetaminophen (NORCO) 7.5-325 MG tablet; Take 1 tablet by mouth  daily.        ----------------------------------------------------------------------------------------------------------------------  Problem List Items Addressed This Visit      Unprioritized   Back pain   Relevant Medications   HYDROcodone-acetaminophen (NORCO) 7.5-325 MG tablet (Start on 01/07/2018)   DDD (degenerative disc disease), thoracic   Relevant Medications   HYDROcodone-acetaminophen (NORCO) 7.5-325 MG tablet (Start on 01/07/2018)   Postherpetic neuralgia - Primary    Other Visit Diagnoses    Chronic, continuous use of opioids       Chronic pain syndrome            ----------------------------------------------------------------------------------------------------------------------  1. Postherpetic neuralgia I will defer on any further changes in his management other than to reduce his opioid use to once a day with 30 tablets written for the next 2 months.  We have reviewed the [facility] practitioner database information and it is appropriate.  We will schedule him for return to clinic in 2 months.  In the meantime he is to continue follow-up with his primary care physicians as gradual.  2. Chronic right-sided thoracic back pain Continue current exercise regimen and physical therapy modalities.  3. DDD (degenerative disc disease), thoracic   4. Chronic, continuous use of opioids   5. Chronic pain syndrome     ----------------------------------------------------------------------------------------------------------------------  I am having Timothy Kalata "Ed" maintain his b complex vitamins, atorvastatin, benazepril, NON FORMULARY, cholecalciferol,  Capsaicin-Menthol, diclofenac sodium, naproxen, cyclobenzaprine, and HYDROcodone-acetaminophen.   Meds ordered this encounter  Medications  . DISCONTD: HYDROcodone-acetaminophen (NORCO) 7.5-325 MG tablet    Sig: Take 1 tablet by mouth daily.    Dispense:  30 tablet    Refill:  0    No early refills   . DISCONTD: HYDROcodone-acetaminophen (NORCO) 7.5-325 MG tablet    Sig: Take 1 tablet by mouth daily.    Dispense:  30 tablet    Refill:  0    No early refills  . HYDROcodone-acetaminophen (NORCO) 7.5-325 MG tablet    Sig: Take 1 tablet by mouth daily.    Dispense:  30 tablet    Refill:  0    No early refills   Patient's Medications  New Prescriptions   No medications on file  Previous Medications   ATORVASTATIN (LIPITOR) 40 MG TABLET    Take 1 tablet (40 mg total) by mouth at bedtime.   B COMPLEX VITAMINS TABLET    Take 1 tablet by mouth daily.    BENAZEPRIL (LOTENSIN) 20 MG TABLET    Take 1 tablet (20 mg total) by mouth daily.   CAPSAICIN-MENTHOL (CAPZASIN QUICK RELIEF) 0.025-10 % GEL    Apply topically.   CHOLECALCIFEROL (VITAMIN D) 1000 UNITS TABLET    Take 1,000 Units by mouth daily.   CYCLOBENZAPRINE (FLEXERIL) 5 MG TABLET    Take 1 tablet (5 mg total) by mouth 3 (three) times daily as needed for muscle spasms.   DICLOFENAC SODIUM (VOLTAREN) 1 % GEL    APP 2 GRAMS EXT AA QID   NAPROXEN (NAPROSYN) 250 MG TABLET    Take by mouth 2 (two) times daily with a meal.   NON FORMULARY    Place under the tongue daily. 5 drops daily  Modified Medications   Modified Medication Previous Medication   HYDROCODONE-ACETAMINOPHEN (NORCO) 7.5-325 MG TABLET HYDROcodone-acetaminophen (NORCO) 7.5-325 MG tablet      Take 1 tablet by mouth daily.    Take 1 tablet by mouth 2 (two) times daily.  Discontinued Medications   No medications on file   ----------------------------------------------------------------------------------------------------------------------  Follow-up: Return in about 2 months (around 02/25/2018) for evaluation, med refill.    [name], MD

## 2017-12-25 NOTE — Progress Notes (Signed)
Nursing Pain Medication Assessment:  Safety precautions to be maintained throughout the outpatient stay will include: orient to surroundings, keep bed in low position, maintain call bell within reach at all times, provide assistance with transfer out of bed and ambulation.  Medication Inspection Compliance: Pill count conducted under aseptic conditions, in front of the patient. Neither the pills nor the bottle was removed from the patient's sight at any time. Once count was completed pills were immediately returned to the patient in their original bottle.  Medication: Hydrocodone/APAP Pill/Patch Count: 23.5 of 45 pills remain Pill/Patch Appearance: Markings consistent with prescribed medication Bottle Appearance: Standard pharmacy container. Clearly labeled. Filled Date: 70 / 03 / 2019 Last Medication intake:  Today

## 2017-12-25 NOTE — Patient Instructions (Signed)
You have been given 2 prescriptions for Norco to last until 03/08/2018

## 2018-02-05 ENCOUNTER — Encounter: Payer: Self-pay | Admitting: Family Medicine

## 2018-02-05 ENCOUNTER — Ambulatory Visit (INDEPENDENT_AMBULATORY_CARE_PROVIDER_SITE_OTHER): Payer: PPO | Admitting: Family Medicine

## 2018-02-05 DIAGNOSIS — R3912 Poor urinary stream: Secondary | ICD-10-CM | POA: Diagnosis not present

## 2018-02-05 DIAGNOSIS — N401 Enlarged prostate with lower urinary tract symptoms: Secondary | ICD-10-CM | POA: Diagnosis not present

## 2018-02-05 DIAGNOSIS — Z7189 Other specified counseling: Secondary | ICD-10-CM | POA: Diagnosis not present

## 2018-02-05 DIAGNOSIS — E7800 Pure hypercholesterolemia, unspecified: Secondary | ICD-10-CM | POA: Diagnosis not present

## 2018-02-05 DIAGNOSIS — I10 Essential (primary) hypertension: Secondary | ICD-10-CM

## 2018-02-05 DIAGNOSIS — R972 Elevated prostate specific antigen [PSA]: Secondary | ICD-10-CM | POA: Diagnosis not present

## 2018-02-05 DIAGNOSIS — M5134 Other intervertebral disc degeneration, thoracic region: Secondary | ICD-10-CM | POA: Diagnosis not present

## 2018-02-05 LAB — URINALYSIS, ROUTINE W REFLEX MICROSCOPIC
Bilirubin, UA: NEGATIVE
Glucose, UA: NEGATIVE
KETONES UA: NEGATIVE
NITRITE UA: NEGATIVE
Protein, UA: NEGATIVE
Specific Gravity, UA: 1.025 (ref 1.005–1.030)
Urobilinogen, Ur: 0.2 mg/dL (ref 0.2–1.0)
pH, UA: 5.5 (ref 5.0–7.5)

## 2018-02-05 LAB — MICROSCOPIC EXAMINATION: RBC, UA: NONE SEEN /hpf (ref 0–2)

## 2018-02-05 MED ORDER — BENAZEPRIL HCL 20 MG PO TABS: 20.0000 mg | ORAL_TABLET | Freq: Every day | ORAL | 4 refills | Status: DC

## 2018-02-05 MED ORDER — ATORVASTATIN CALCIUM 40 MG PO TABS: 40.0000 mg | ORAL_TABLET | Freq: Every day | ORAL | 4 refills | Status: DC

## 2018-02-05 NOTE — Progress Notes (Signed)
BP (!) 100/55 (BP Location: Left Arm, Patient Position: Sitting, Cuff Size: Normal)   Pulse 62   Temp 97.8 F (36.6 C) (Oral)   Ht 5' 6.5" (1.689 m)   Wt 163 lb 14.4 oz (74.3 kg)   SpO2 95%   BMI 26.06 kg/m    Subjective:    Patient ID: Timothy Knapp, male    DOB: [birth year], 78 y.o.   MRN: [id number]  HPI: SIDDHARTH BABINGTON is a 78 y.o. male  Chief Complaint  Patient presents with  . Annual Exam  Patient all in all doing well has had quite an eventful year blood pressures have been doing good with home monitoring with no issues taking Benzapril 20 mg a day. Takes atorvastatin 40 mg half a tablet without problems and good control. Did have knee replacement surgery approximately 13 months ago does not have full range of motion yet but is getting better and doing some home physical therapy training. Has had BPH with elevated PSA and is been followed by [facility] last visit was in August with just follow-up PSA and if returns or is continued lower will discontinue PSA screening. Patient does have some nocturia but drinks a lot of water. Back pain degenerative disc disease and postherpetic neuralgia is down to 1 pain pill a day through the pain clinic and doing better hoping to continue to decrease. Is able to be very physically active finished [place] the [place] and pickleball.  Relevant past medical, surgical, family and social history reviewed and updated as indicated. Interim medical history since our last visit reviewed. Allergies and medications reviewed and updated.  Review of Systems  Constitutional: Negative.   HENT: Negative.   Eyes: Negative.   Respiratory: Negative.   Cardiovascular: Negative.   Gastrointestinal: Negative.   Endocrine: Negative.   Genitourinary: Negative.   Musculoskeletal: Negative.   Skin: Negative.   Allergic/Immunologic: Negative.   Neurological: Negative.   Hematological: Negative.   Psychiatric/Behavioral: Negative.     Per HPI unless  specifically indicated above     Objective:    BP (!) 100/55 (BP Location: Left Arm, Patient Position: Sitting, Cuff Size: Normal)   Pulse 62   Temp 97.8 F (36.6 C) (Oral)   Ht 5' 6.5" (1.689 m)   Wt 163 lb 14.4 oz (74.3 kg)   SpO2 95%   BMI 26.06 kg/m   Wt Readings from Last 3 Encounters:  02/05/18 163 lb 14.4 oz (74.3 kg)  12/25/17 160 lb (72.6 kg)  11/05/17 160 lb 8 oz (72.8 kg)    Physical Exam Constitutional:      Appearance: He is well-developed.  HENT:     Head: Normocephalic and atraumatic.     Right Ear: External ear normal.     Left Ear: External ear normal.  Eyes:     Conjunctiva/sclera: Conjunctivae normal.     Pupils: Pupils are equal, round, and reactive to light.  Neck:     Musculoskeletal: Normal range of motion and neck supple.  Cardiovascular:     Rate and Rhythm: Normal rate and regular rhythm.     Heart sounds: Normal heart sounds.  Pulmonary:     Effort: Pulmonary effort is normal.     Breath sounds: Normal breath sounds.  Abdominal:     General: Bowel sounds are normal.     Palpations: Abdomen is soft. There is no hepatomegaly or splenomegaly.  Genitourinary:    Penis: Normal.      Rectum: Normal.  Comments: BPH changes Musculoskeletal: Normal range of motion.  Skin:    Findings: No erythema or rash.  Neurological:     Mental Status: He is alert and oriented to person, place, and time.     Deep Tendon Reflexes: Reflexes are normal and symmetric.  Psychiatric:        Behavior: Behavior normal.        Thought Content: Thought content normal.        Judgment: Judgment normal.     Results for orders placed or performed in visit on 08/21/17  PSA  Result Value Ref Range   Prostate Specific Ag, Serum 4.8 (H) 0.0 - 4.0 ng/mL      Assessment & Plan:   Problem List Items Addressed This Visit      Cardiovascular and Mediastinum   Hypertension    The current medical regimen is effective;  continue present plan and medications.         Relevant Medications   benazepril (LOTENSIN) 20 MG tablet   atorvastatin (LIPITOR) 40 MG tablet   Other Relevant Orders   Comprehensive metabolic panel   CBC with Differential/Platelet   TSH     Musculoskeletal and Integument   DDD (degenerative disc disease), thoracic    Stable       Relevant Orders   Comprehensive metabolic panel   CBC with Differential/Platelet   TSH     Genitourinary   Benign prostatic hyperplasia with lower urinary tract symptoms    The current medical regimen is effective;  continue present plan and medications.       Relevant Orders   Comprehensive metabolic panel   CBC with Differential/Platelet   TSH   Urinalysis, Routine w reflex microscopic     Other   Hyperlipidemia    The current medical regimen is effective;  continue present plan and medications.       Relevant Medications   benazepril (LOTENSIN) 20 MG tablet   atorvastatin (LIPITOR) 40 MG tablet   Other Relevant Orders   Lipid panel   Advanced care planning/counseling discussion    A voluntary discussion about advanced care planning including explanation and discussion of advanced directives was extentively discussed with the patient.  Explained about the healthcare proxy and living will was reviewed and packet with forms with expiration of how to fill them out was given.  Time spent: Encounter 16+ min individuals present: Patient      Elevated PSA    Labs pending      Relevant Orders   PSA       Follow up plan: Return in about 6 months (around 08/06/2018) for BMP,  Lipids, ALT, AST.

## 2018-02-05 NOTE — Assessment & Plan Note (Signed)
A voluntary discussion about advanced care planning including explanation and discussion of advanced directives was extentively discussed with the patient.  Explained about the healthcare proxy and living will was reviewed and packet with forms with expiration of how to fill them out was given.  Time spent: Encounter 16+ min individuals present: Patient 

## 2018-02-05 NOTE — Progress Notes (Signed)
Depression screen [facility] 2/9 12/25/2017 10/30/2017 07/08/2017  Decreased Interest 0 0 0  Down, Depressed, Hopeless 0 0 0  PHQ - 2 Score 0 0 0   Functional Status Survey: Is the patient deaf or have difficulty hearing?: No Does the patient have difficulty seeing, even when wearing glasses/contacts?: No Does the patient have difficulty concentrating, remembering, or making decisions?: No Does the patient have difficulty walking or climbing stairs?: No Does the patient have difficulty dressing or bathing?: No Does the patient have difficulty doing errands alone such as visiting a doctor's office or shopping?: No   Fall Risk  02/05/2018 12/25/2017 10/30/2017 09/04/2017 07/08/2017  Falls in the past year? 0 0 No No No  Comment - - - - -  Number falls in past yr: 0 0 - - -  Injury with Fall? 0 0 - - -  Follow up Falls evaluation completed - - - -

## 2018-02-05 NOTE — Assessment & Plan Note (Signed)
Labs pending.  

## 2018-02-05 NOTE — Assessment & Plan Note (Signed)
The current medical regimen is effective;  continue present plan and medications.  

## 2018-02-05 NOTE — Assessment & Plan Note (Signed)
Stable

## 2018-02-06 ENCOUNTER — Encounter: Payer: Self-pay | Admitting: Family Medicine

## 2018-02-06 LAB — CBC WITH DIFFERENTIAL/PLATELET
Basophils Absolute: 0 10*3/uL (ref 0.0–0.2)
Basos: 1 %
EOS (ABSOLUTE): 0.1 10*3/uL (ref 0.0–0.4)
Eos: 2 %
HEMOGLOBIN: 15.7 g/dL (ref 13.0–17.7)
Hematocrit: 47.6 % (ref 37.5–51.0)
IMMATURE GRANS (ABS): 0 10*3/uL (ref 0.0–0.1)
IMMATURE GRANULOCYTES: 0 %
LYMPHS: 22 %
Lymphocytes Absolute: 1.4 10*3/uL (ref 0.7–3.1)
MCH: 30.1 pg (ref 26.6–33.0)
MCHC: 33 g/dL (ref 31.5–35.7)
MCV: 91 fL (ref 79–97)
Monocytes Absolute: 0.6 10*3/uL (ref 0.1–0.9)
Monocytes: 10 %
NEUTROS ABS: 4.2 10*3/uL (ref 1.4–7.0)
Neutrophils: 65 %
Platelets: 188 10*3/uL (ref 150–450)
RBC: 5.21 x10E6/uL (ref 4.14–5.80)
RDW: 12.6 % (ref 11.6–15.4)
WBC: 6.3 10*3/uL (ref 3.4–10.8)

## 2018-02-06 LAB — LIPID PANEL
Chol/HDL Ratio: 3.7 ratio (ref 0.0–5.0)
Cholesterol, Total: 181 mg/dL (ref 100–199)
HDL: 49 mg/dL (ref >39)
LDL Calculated: 101 mg/dL — ABNORMAL HIGH (ref 0–99)
TRIGLYCERIDES: 156 mg/dL — AB (ref 0–149)
VLDL Cholesterol Cal: 31 mg/dL (ref 5–40)

## 2018-02-06 LAB — COMPREHENSIVE METABOLIC PANEL
ALBUMIN: 4.6 g/dL (ref 3.7–4.7)
ALT: 28 IU/L (ref 0–44)
AST: 36 IU/L (ref 0–40)
Albumin/Globulin Ratio: 2 (ref 1.2–2.2)
Alkaline Phosphatase: 57 IU/L (ref 39–117)
BUN / CREAT RATIO: 27 — AB (ref 10–24)
BUN: 23 mg/dL (ref 8–27)
Bilirubin Total: 0.6 mg/dL (ref 0.0–1.2)
CO2: 23 mmol/L (ref 20–29)
CREATININE: 0.85 mg/dL (ref 0.76–1.27)
Calcium: 9.6 mg/dL (ref 8.6–10.2)
Chloride: 103 mmol/L (ref 96–106)
GFR calc Af Amer: 96 mL/min/{1.73_m2} (ref >59)
GFR calc non Af Amer: 83 mL/min/{1.73_m2} (ref >59)
GLOBULIN, TOTAL: 2.3 g/dL (ref 1.5–4.5)
Glucose: 86 mg/dL (ref 65–99)
Potassium: 4.8 mmol/L (ref 3.5–5.2)
SODIUM: 143 mmol/L (ref 134–144)
TOTAL PROTEIN: 6.9 g/dL (ref 6.0–8.5)

## 2018-02-06 LAB — PSA: PROSTATE SPECIFIC AG, SERUM: 5.3 ng/mL — AB (ref 0.0–4.0)

## 2018-02-06 LAB — TSH: TSH: 2.19 u[IU]/mL (ref 0.450–4.500)

## 2018-03-18 ENCOUNTER — Other Ambulatory Visit: Payer: Self-pay

## 2018-03-18 ENCOUNTER — Encounter: Payer: Self-pay | Admitting: Anesthesiology

## 2018-03-18 ENCOUNTER — Ambulatory Visit: Payer: PPO | Attending: Anesthesiology | Admitting: Anesthesiology

## 2018-03-18 VITALS — BP 139/75 | HR 60 | Temp 98.1°F | Resp 18 | Ht 67.0 in | Wt 164.0 lb

## 2018-03-18 DIAGNOSIS — F1190 Opioid use, unspecified, uncomplicated: Secondary | ICD-10-CM | POA: Diagnosis not present

## 2018-03-18 DIAGNOSIS — B0229 Other postherpetic nervous system involvement: Secondary | ICD-10-CM | POA: Diagnosis not present

## 2018-03-18 DIAGNOSIS — G8929 Other chronic pain: Secondary | ICD-10-CM | POA: Diagnosis not present

## 2018-03-18 DIAGNOSIS — G894 Chronic pain syndrome: Secondary | ICD-10-CM | POA: Diagnosis not present

## 2018-03-18 DIAGNOSIS — M546 Pain in thoracic spine: Secondary | ICD-10-CM | POA: Insufficient documentation

## 2018-03-18 DIAGNOSIS — M5134 Other intervertebral disc degeneration, thoracic region: Secondary | ICD-10-CM | POA: Insufficient documentation

## 2018-03-18 MED ORDER — HYDROCODONE-ACETAMINOPHEN 7.5-325 MG PO TABS: 1.0000 | ORAL_TABLET | Freq: Every day | ORAL | 0 refills | Status: AC

## 2018-03-18 NOTE — Progress Notes (Signed)
Nursing Pain Medication Assessment:  Safety precautions to be maintained throughout the outpatient stay will include: orient to surroundings, keep bed in low position, maintain call bell within reach at all times, provide assistance with transfer out of bed and ambulation.  Medication Inspection Compliance: Pill count conducted under aseptic conditions, in front of the patient. Neither the pills nor the bottle was removed from the patient's sight at any time. Once count was completed pills were immediately returned to the patient in their original bottle.  Medication: Hydrocodone/APAP Pill/Patch Count: 1.5 of 30 pills remain Pill/Patch Appearance: Markings consistent with prescribed medication Bottle Appearance: Standard pharmacy container. Clearly labeled. Filled Date: 01 / 30 / 2020 Last Medication intake:  Yesterday

## 2018-03-18 NOTE — Progress Notes (Signed)
Subjective:  Patient ID: Timothy Knapp, male    DOB: 12-29-1940  Age: 78 y.o. MRN: [id number]  CC: Medication Refill   Procedure: None  HPI Timothy Knapp presents for reevaluation.  He was last seen in December and has been doing relatively well since then.  He still experiencing some back pain and right-sided thoracic pain however this seems to be well controlled with his current regimen.  He is weaned off of the capsaicin cream and applies this only rarely and is down to 1 Vicodin tablet per day.  He denies any side effects with the medications and the quality characteristic and distribution the pain is been otherwise stable in nature.  Based on his narcotic assessment sheet he does well with the hydrocodone and no side effects are reported.  Outpatient Medications Prior to Visit  Medication Sig Dispense Refill  . atorvastatin (LIPITOR) 40 MG tablet Take 1 tablet (40 mg total) by mouth at bedtime. 90 tablet 4  . b complex vitamins tablet Take 1 tablet by mouth daily.     . benazepril (LOTENSIN) 20 MG tablet Take 1 tablet (20 mg total) by mouth daily. 90 tablet 4  . Capsaicin-Menthol (CAPZASIN QUICK RELIEF) 0.025-10 % GEL Apply topically.    . Cholecalciferol (VITAMIN D3) 25 MCG (1000 UT) CAPS Take 1 capsule by mouth.    . Cyanocobalamin (B-12 PO) Take 1 Dose by mouth daily.    . Omega-3 Fatty Acids (OMEGA-3 PO) Take 788 mg by mouth daily.    . vitamin C (ASCORBIC ACID) 500 MG tablet Take 500 mg by mouth daily.     No facility-administered medications prior to visit.     Review of Systems CNS: No confusion or sedation Cardiac: No angina or palpitations GI: No abdominal pain or constipation Constitutional: No nausea vomiting fevers or chills  Objective:  BP 139/75   Pulse 60   Temp 98.1 F (36.7 C) (Oral)   Resp 18   Ht 5\' 7"  (1.702 m)   Wt 164 lb (74.4 kg)   SpO2 98%   BMI 25.69 kg/m    BP Readings from Last 3 Encounters:  03/18/18 139/75  02/05/18 (!) 100/55   12/25/17 137/74     Wt Readings from Last 3 Encounters:  03/18/18 164 lb (74.4 kg)  02/05/18 163 lb 14.4 oz (74.3 kg)  12/25/17 160 lb (72.6 kg)     Physical Exam Pt is alert and oriented PERRL EOMI HEART IS RRR no murmur or rub LCTA no wheezing or rales Skill skeletal: He is ambulating with a nonantalgic gait and his muscle tone and bulk to the lower extremities appears to be at baseline.  Labs  No results found for: HGBA1C Lab Results  Component Value Date   LDLCALC 101 (H) 02/05/2018   CREATININE 0.85 02/05/2018    -------------------------------------------------------------------------------------------------------------------- Lab Results  Component Value Date   WBC 6.3 02/05/2018   HGB 15.7 02/05/2018   HCT 47.6 02/05/2018   PLT 188 02/05/2018   GLUCOSE 86 02/05/2018   CHOL 181 02/05/2018   TRIG 156 (H) 02/05/2018   HDL 49 02/05/2018   LDLCALC 101 (H) 02/05/2018   ALT 28 02/05/2018   AST 36 02/05/2018   NA 143 02/05/2018   K 4.8 02/05/2018   CL 103 02/05/2018   CREATININE 0.85 02/05/2018   BUN 23 02/05/2018   CO2 23 02/05/2018   TSH 2.190 02/05/2018   INR 0.95 11/28/2016    --------------------------------------------------------------------------------------------------------------------- Ct Abdomen Pelvis W Contrast  Result Date: 03/29/2017 CLINICAL DATA:  Hematuria EXAM: CT ABDOMEN AND PELVIS WITH CONTRAST TECHNIQUE: Multidetector CT imaging of the abdomen and pelvis was performed using the standard protocol following bolus administration of intravenous contrast. CONTRAST:  173mL ISOVUE-300 IOPAMIDOL (ISOVUE-300) INJECTION 61% COMPARISON:  04/25/2013 FINDINGS: Lower chest: Punctate granuloma in the right middle lobe. No acute consolidation or pleural effusion. Normal heart size. Small hiatal hernia. Hepatobiliary: No focal liver abnormality is seen. No gallstones, gallbladder wall thickening, or biliary dilatation. Pancreas: Unremarkable. No  pancreatic ductal dilatation or surrounding inflammatory changes. Spleen: Normal in size without focal abnormality. Adrenals/Urinary Tract: Adrenal glands are within normal limits. Punctate nonobstructing stones in the right kidney. Subcentimeter hypodense renal lesions too small to further characterize. No hydronephrosis. Bladder unremarkable. Stomach/Bowel: Stomach is within normal limits. Appendix appears normal. No evidence of bowel wall thickening, distention, or inflammatory changes. Sigmoid colon diverticular disease without acute inflammation Vascular/Lymphatic: Moderate aortic atherosclerosis. No aneurysmal dilatation. No significantly enlarged lymph nodes. Reproductive: Enlarged prostate with calcification Other: Negative for free air or free fluid. Small fat in the umbilical region Musculoskeletal: Degenerative changes. No acute or suspicious lesion. IMPRESSION: 1. No CT evidence for acute intra-abdominal or pelvic abnormality. 2. Punctate nonobstructing stones in the right kidney. 3. Sigmoid colon diverticular disease without acute inflammation 4. Enlarged prostate Electronically Signed   By: Donavan Foil M.D.   On: 03/29/2017 23:20     Assessment & Plan:   Timothy Knapp was seen today for medication refill.  Diagnoses and all orders for this visit:  Postherpetic neuralgia  Chronic right-sided thoracic back pain  DDD (degenerative disc disease), thoracic  Chronic, continuous use of opioids  Chronic pain syndrome  Other orders -     HYDROcodone-acetaminophen (NORCO) 7.5-325 MG tablet; Take 1 tablet by mouth daily for 30 days. -     HYDROcodone-acetaminophen (NORCO) 7.5-325 MG tablet; Take 1 tablet by mouth daily for 30 days.        ----------------------------------------------------------------------------------------------------------------------  Problem List Items Addressed This Visit      Unprioritized   Back pain   Relevant Medications   HYDROcodone-acetaminophen (NORCO)  7.5-325 MG tablet   HYDROcodone-acetaminophen (NORCO) 7.5-325 MG tablet (Start on 04/17/2018)   DDD (degenerative disc disease), thoracic   Relevant Medications   HYDROcodone-acetaminophen (NORCO) 7.5-325 MG tablet   HYDROcodone-acetaminophen (NORCO) 7.5-325 MG tablet (Start on 04/17/2018)   Postherpetic neuralgia - Primary    Other Visit Diagnoses    Chronic, continuous use of opioids       Chronic pain syndrome            ----------------------------------------------------------------------------------------------------------------------  1. Postherpetic neuralgia We will defer on any changes today other than to reduce his second month prescription to 1/2 tablet twice a day or once a day as needed.  Prescriptions will be given today for March 10 number 30 tablets in April 9 number 30 tablets which can take 1/2 tablet/day or 1/2 tablet twice a day.  We will schedule him for return to clinic in 3 months.  He is to continue follow-up with his primary care physicians and we have reviewed the [facility] practitioner database information and it is appropriate.  2. Chronic right-sided thoracic back pain As above  3. DDD (degenerative disc disease), thoracic As above  4. Chronic, continuous use of opioids As above with return to clinic in 3 months.  Also talked about possibly reinitiating gabapentin at nighttime for his persistent neuropathic type pain.  5. Chronic pain syndrome As above    ----------------------------------------------------------------------------------------------------------------------  I am having [name] "Ed" start on HYDROcodone-acetaminophen and HYDROcodone-acetaminophen. I am also having him maintain his b complex vitamins, Capsaicin-Menthol, Vitamin D3, Cyanocobalamin (B-12 PO), vitamin C, Omega-3 Fatty Acids (OMEGA-3 PO), benazepril, and atorvastatin.   Meds ordered this encounter  Medications  . HYDROcodone-acetaminophen (NORCO) 7.5-325 MG  tablet    Sig: Take 1 tablet by mouth daily for 30 days.    Dispense:  30 tablet    Refill:  0  . HYDROcodone-acetaminophen (NORCO) 7.5-325 MG tablet    Sig: Take 1 tablet by mouth daily for 30 days.    Dispense:  30 tablet    Refill:  0   Patient's Medications  New Prescriptions   HYDROCODONE-ACETAMINOPHEN (NORCO) 7.5-325 MG TABLET    Take 1 tablet by mouth daily for 30 days.   HYDROCODONE-ACETAMINOPHEN (NORCO) 7.5-325 MG TABLET    Take 1 tablet by mouth daily for 30 days.  Previous Medications   ATORVASTATIN (LIPITOR) 40 MG TABLET    Take 1 tablet (40 mg total) by mouth at bedtime.   B COMPLEX VITAMINS TABLET    Take 1 tablet by mouth daily.    BENAZEPRIL (LOTENSIN) 20 MG TABLET    Take 1 tablet (20 mg total) by mouth daily.   CAPSAICIN-MENTHOL (CAPZASIN QUICK RELIEF) 0.025-10 % GEL    Apply topically.   CHOLECALCIFEROL (VITAMIN D3) 25 MCG (1000 UT) CAPS    Take 1 capsule by mouth.   CYANOCOBALAMIN (B-12 PO)    Take 1 Dose by mouth daily.   OMEGA-3 FATTY ACIDS (OMEGA-3 PO)    Take 788 mg by mouth daily.   VITAMIN C (ASCORBIC ACID) 500 MG TABLET    Take 500 mg by mouth daily.  Modified Medications   No medications on file  Discontinued Medications   No medications on file   ----------------------------------------------------------------------------------------------------------------------  Follow-up: Return in about 3 months (around 06/18/2018) for evaluation, med refill.    [name], MD

## 2018-04-05 IMAGING — CT CT ABD-PELV W/ CM
2 of 5 series · 16 of 46 positions shown, 18 images · IV contrast (APPLIED)
Comparison: 04/25/2013

CLINICAL DATA: Hematuria

EXAM:
CT ABDOMEN AND PELVIS WITH CONTRAST
TECHNIQUE: Multidetector CT imaging of the abdomen and pelvis was performed
using the standard protocol following bolus administration of
intravenous contrast.
CONTRAST:  100mL [id number] IOPAMIDOL (PT9NQW-U99) INJECTION 61%

[Series 2: axial st · axial · 0.73mm/px · z∈[-553,-118]mm · 13 of 99 slices shown, 15 images]
[im 6/99  soft-tissue]
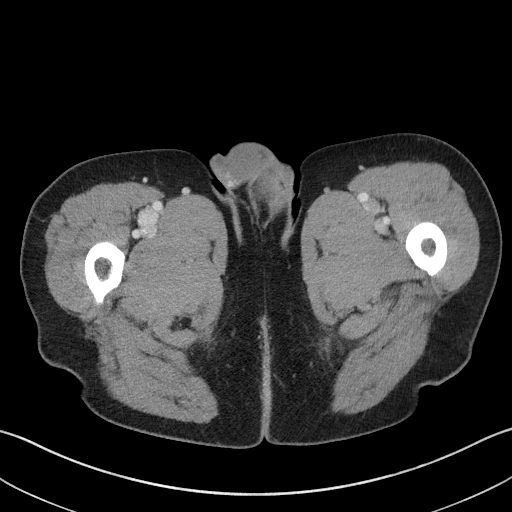
[im 6/99  bone]
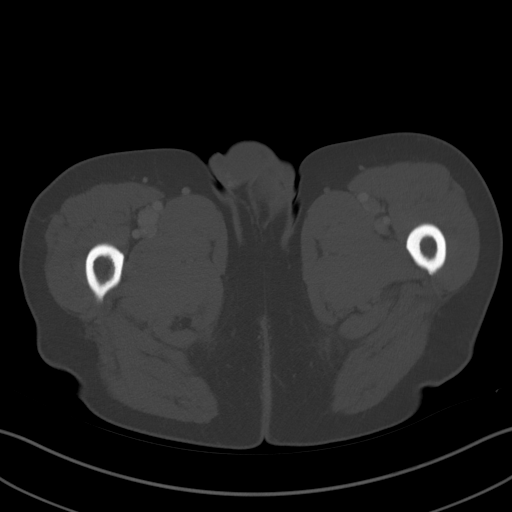
[im 16/99  soft-tissue]
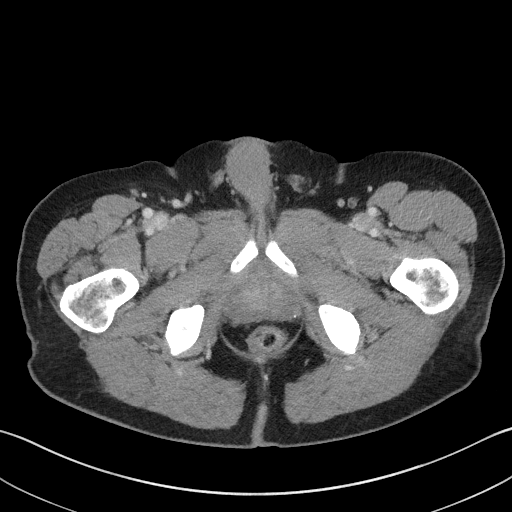
[im 21/99  soft-tissue]
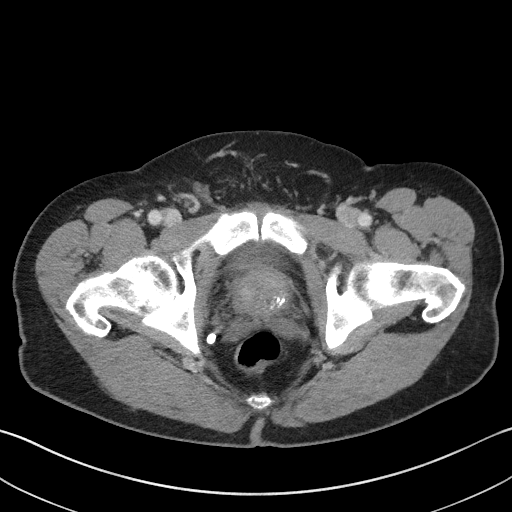
[im 26/99  soft-tissue]
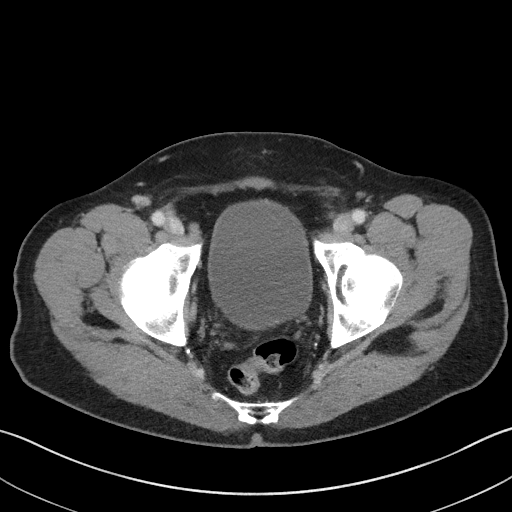
[im 37/99  soft-tissue]
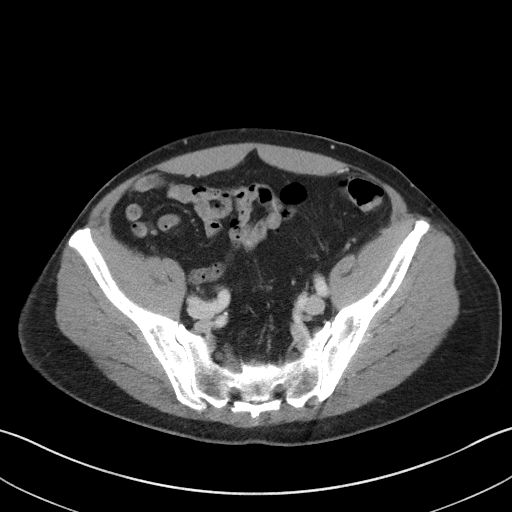
[im 42/99  soft-tissue]
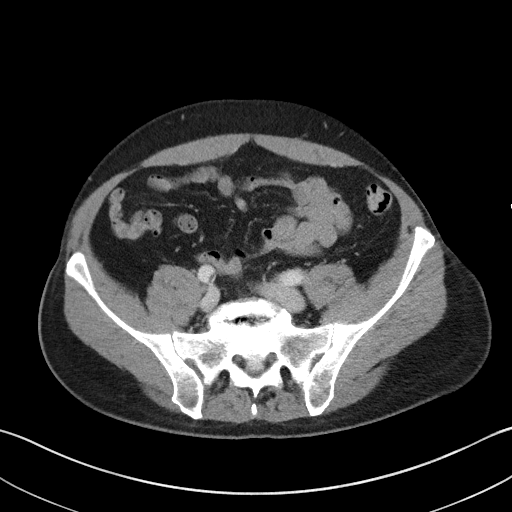
[im 52/99  soft-tissue]
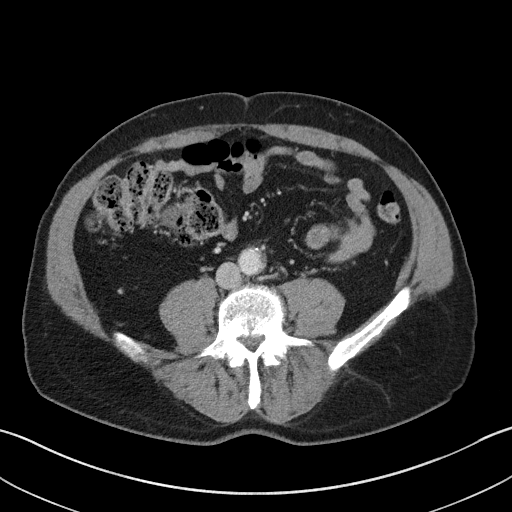
[im 57/99  soft-tissue]
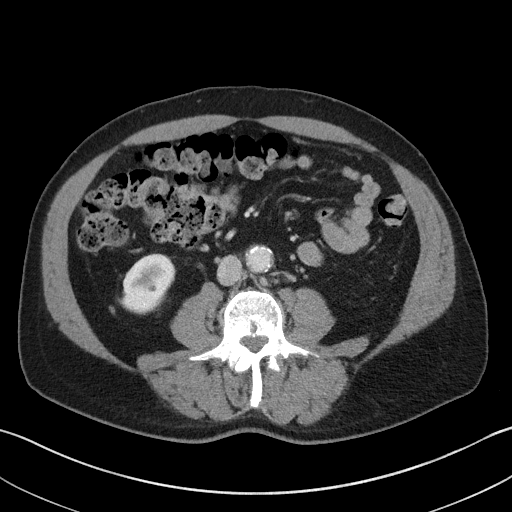
[im 62/99  soft-tissue]
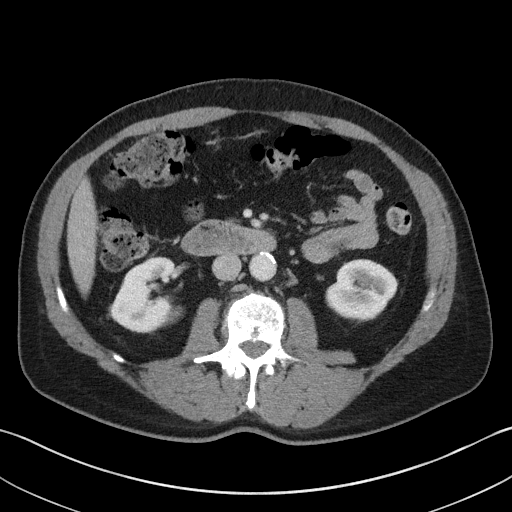
[im 62/99  bone]
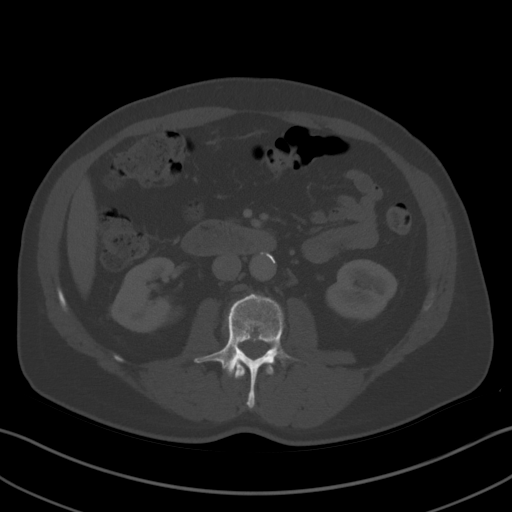
[im 73/99  soft-tissue]
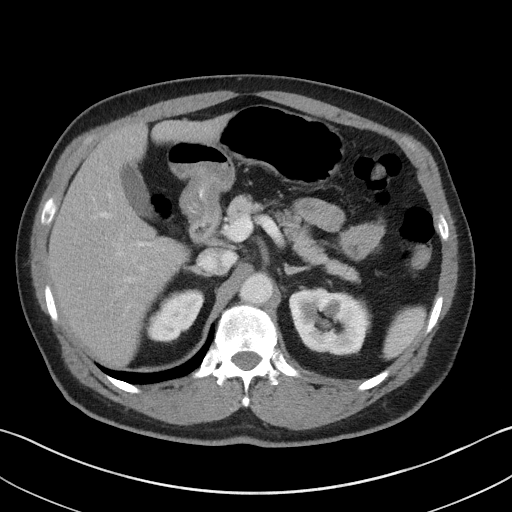
[im 78/99  soft-tissue]
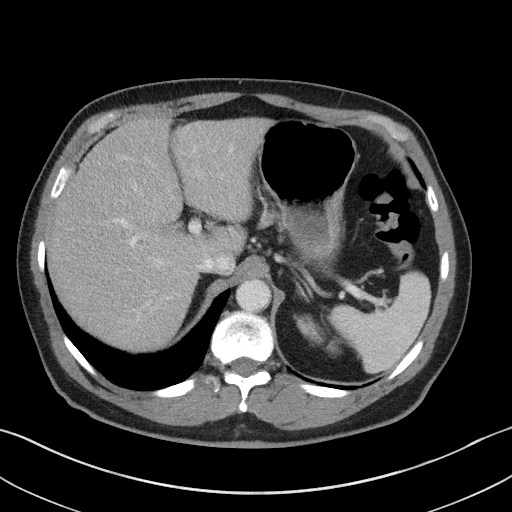
[im 83/99  soft-tissue]
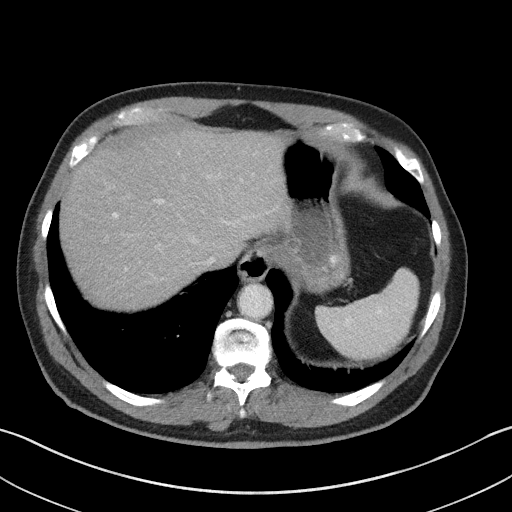
[im 93/99  soft-tissue]
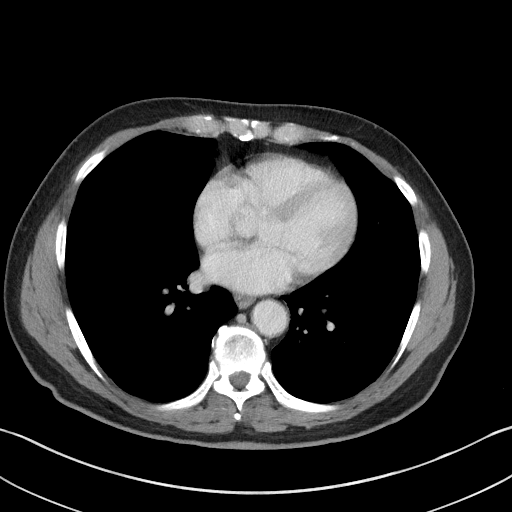

[Series 5: coronal st · coronal · 0.68mm/px · 3 of 88 slices shown]
[im 30/88  soft-tissue]
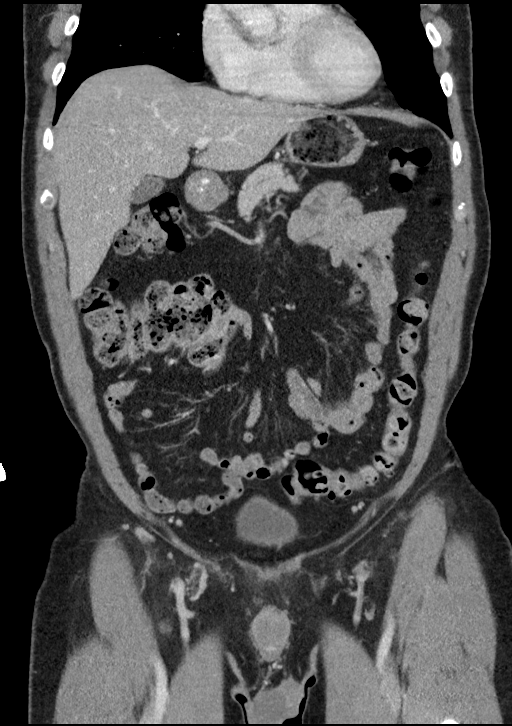
[im 39/88  soft-tissue]
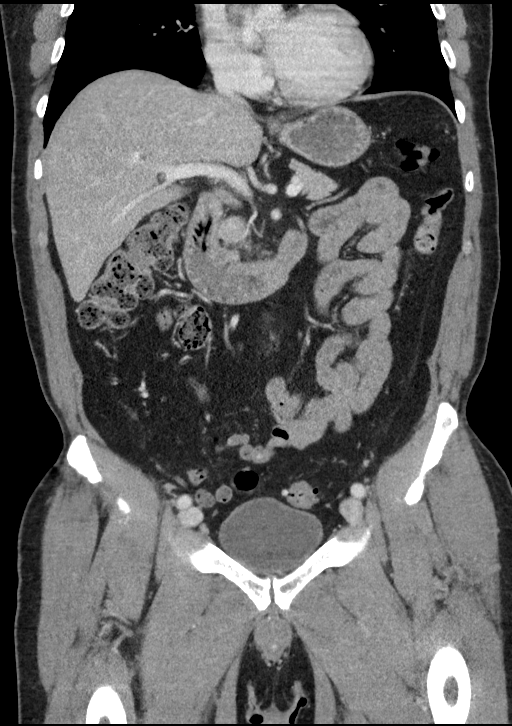
[im 49/88  soft-tissue]
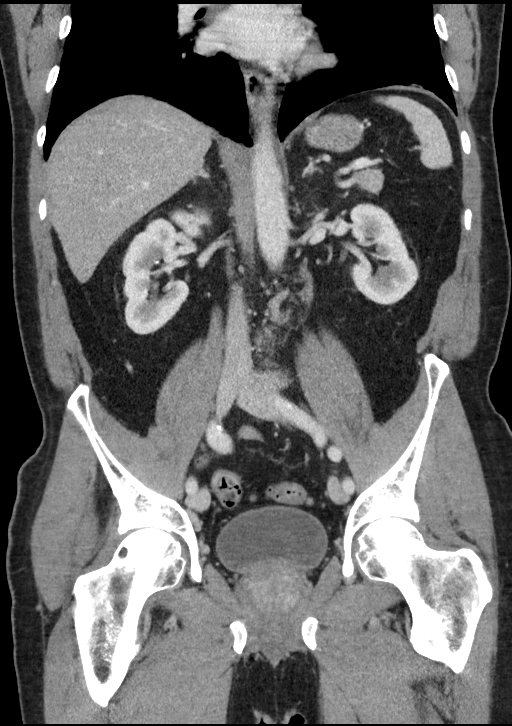

[16 of 46 positions shown; findings below may reference images not displayed]

FINDINGS: Lower chest: Punctate granuloma in the right middle lobe. No acute
consolidation or pleural effusion. Normal heart size. Small hiatal
hernia.

Hepatobiliary: No focal liver abnormality is seen. No gallstones,
gallbladder wall thickening, or biliary dilatation.

Pancreas: Unremarkable. No pancreatic ductal dilatation or
surrounding inflammatory changes.

Spleen: Normal in size without focal abnormality.

Adrenals/Urinary Tract: Adrenal glands are within normal limits.
Punctate nonobstructing stones in the right kidney. Subcentimeter
hypodense renal lesions too small to further characterize. No
hydronephrosis. Bladder unremarkable.

Stomach/Bowel: Stomach is within normal limits. Appendix appears
normal. No evidence of bowel wall thickening, distention, or
inflammatory changes. Sigmoid colon diverticular disease without
acute inflammation

Vascular/Lymphatic: Moderate aortic atherosclerosis. No aneurysmal
dilatation. No significantly enlarged lymph nodes.

Reproductive: Enlarged prostate with calcification

Other: Negative for free air or free fluid. Small fat in the
umbilical region

Musculoskeletal: Degenerative changes. No acute or suspicious
lesion.
IMPRESSION: 1. No CT evidence for acute intra-abdominal or pelvic abnormality.
2. Punctate nonobstructing stones in the right kidney.
3. Sigmoid colon diverticular disease without acute inflammation
4. Enlarged prostate

## 2018-04-21 ENCOUNTER — Telehealth: Payer: Self-pay | Admitting: Anesthesiology

## 2018-04-21 NOTE — Telephone Encounter (Signed)
Spoke with Timothy Knapp, he states that Dr. Andree Elk told him he would write the gabapentin just to call in to the office to start. Please verify with Dr. Andree Elk.

## 2018-04-21 NOTE — Telephone Encounter (Signed)
Patient called requesting refill on Hydrocodone, last visit was 03-18-18. Also would like to start taking gabapentin. Has taken it before.

## 2018-04-21 NOTE — Telephone Encounter (Signed)
Message on Dr Andree Elk desk

## 2018-04-22 MED ORDER — GABAPENTIN 400 MG PO CAPS: 400.0000 mg | ORAL_CAPSULE | Freq: Two times a day (BID) | ORAL | 1 refills | Status: DC

## 2018-04-22 NOTE — Addendum Note (Signed)
Addended by: [name] on: 04/22/2018 02:56 PM   Modules accepted: Orders

## 2018-06-09 ENCOUNTER — Other Ambulatory Visit: Payer: Self-pay

## 2018-06-09 ENCOUNTER — Encounter: Payer: Self-pay | Admitting: Anesthesiology

## 2018-06-09 ENCOUNTER — Ambulatory Visit: Payer: PPO | Attending: Anesthesiology | Admitting: Anesthesiology

## 2018-06-09 DIAGNOSIS — F1190 Opioid use, unspecified, uncomplicated: Secondary | ICD-10-CM | POA: Diagnosis not present

## 2018-06-09 DIAGNOSIS — M5134 Other intervertebral disc degeneration, thoracic region: Secondary | ICD-10-CM | POA: Diagnosis not present

## 2018-06-09 DIAGNOSIS — B0229 Other postherpetic nervous system involvement: Secondary | ICD-10-CM | POA: Diagnosis not present

## 2018-06-09 DIAGNOSIS — M546 Pain in thoracic spine: Secondary | ICD-10-CM | POA: Diagnosis not present

## 2018-06-09 DIAGNOSIS — G894 Chronic pain syndrome: Secondary | ICD-10-CM | POA: Diagnosis not present

## 2018-06-09 DIAGNOSIS — G8929 Other chronic pain: Secondary | ICD-10-CM | POA: Diagnosis not present

## 2018-06-09 MED ORDER — HYDROCODONE-ACETAMINOPHEN 7.5-325 MG PO TABS: 1.0000 | ORAL_TABLET | Freq: Every day | ORAL | 0 refills | Status: DC

## 2018-06-09 MED ORDER — HYDROCODONE-ACETAMINOPHEN 7.5-325 MG PO TABS: 1.0000 | ORAL_TABLET | Freq: Every day | ORAL | 0 refills | Status: AC

## 2018-06-09 NOTE — Progress Notes (Signed)
Virtual Visit via Telephone Note  I connected with Timothy Knapp on 06/09/18 at  1:30 PM EDT by telephone and verified that I am speaking with the correct person using two identifiers.  Location: Patient: Home Provider: Pain control center   I discussed the limitations, risks, security and privacy concerns of performing an evaluation and management service by telephone and the availability of in person appointments. I also discussed with the patient that there may be a patient responsible charge related to this service. The patient expressed understanding and agreed to proceed.   History of Present Illness: I spoke with Timothy Knapp over the phone today.  He reports that he has been doing well in regards to his pain control syndrome.  No significant changes are reported in the quality characteristic or distribution of pain.  Otherwise he is in his usual state of health.  He is taking his medications as prescribed generally using approximately 1 hydrocodone per day at bedtime and can he continues to use his Neurontin otherwise he is in his usual state of health.  No changes are reported.  He continues to derive good functional lifestyle improvement with the medications.    Observations/Objective:  Current Outpatient Medications:  .  atorvastatin (LIPITOR) 40 MG tablet, Take 1 tablet (40 mg total) by mouth at bedtime., Disp: 90 tablet, Rfl: 4 .  b complex vitamins tablet, Take 1 tablet by mouth daily. , Disp: , Rfl:  .  benazepril (LOTENSIN) 20 MG tablet, Take 1 tablet (20 mg total) by mouth daily., Disp: 90 tablet, Rfl: 4 .  Capsaicin-Menthol (CAPZASIN QUICK RELIEF) 0.025-10 % GEL, Apply topically., Disp: , Rfl:  .  Cholecalciferol (VITAMIN D3) 25 MCG (1000 UT) CAPS, Take 1 capsule by mouth., Disp: , Rfl:  .  Cyanocobalamin (B-12 PO), Take 1 Dose by mouth daily., Disp: , Rfl:  .  gabapentin (NEURONTIN) 400 MG capsule, Take 1 capsule (400 mg total) by mouth 2 (two) times daily for 30 days., Disp:  60 capsule, Rfl: 1 .  HYDROcodone-acetaminophen (NORCO) 7.5-325 MG tablet, Take 1 tablet by mouth at bedtime for 30 days., Disp: 30 tablet, Rfl: 0 .  [START ON 07/09/2018] HYDROcodone-acetaminophen (NORCO) 7.5-325 MG tablet, Take 1 tablet by mouth at bedtime for 30 days., Disp: 30 tablet, Rfl: 0 .  Omega-3 Fatty Acids (OMEGA-3 PO), Take 788 mg by mouth daily., Disp: , Rfl:  .  vitamin C (ASCORBIC ACID) 500 MG tablet, Take 500 mg by mouth daily., Disp: , Rfl:    Assessment and Plan: 1. Postherpetic neuralgia   2. Chronic right-sided thoracic back pain   3. DDD (degenerative disc disease), thoracic   4. Chronic, continuous use of opioids   5. Chronic pain syndrome   Based on our discussion today and after review of the [facility] practitioner database information, I will prescribe and renew his hydrocodone for June 1 and July 1.  In the meantime he is to continue with his Neurontin.  He is instructed to contact us at the pain control center should he have any changes in his pain syndrome.  He is to continue follow-up with his primary care physicians.  He is scheduled for return to clinic in 2 months  Follow Up Instructions: Return in 2 months    I discussed the assessment and treatment plan with the patient. The patient was provided an opportunity to ask questions and all were answered. The patient agreed with the plan and demonstrated an understanding of the instructions.  The patient was advised to call back or seek an in-person evaluation if the symptoms worsen or if the condition fails to improve as anticipated.  I provided 20 minutes of non-face-to-face time during this encounter.   [name], MD

## 2018-06-24 ENCOUNTER — Other Ambulatory Visit: Payer: Self-pay | Admitting: Anesthesiology

## 2018-07-10 ENCOUNTER — Other Ambulatory Visit: Payer: Self-pay

## 2018-07-10 ENCOUNTER — Ambulatory Visit: Payer: PPO | Attending: Anesthesiology | Admitting: Anesthesiology

## 2018-07-10 ENCOUNTER — Encounter: Payer: Self-pay | Admitting: Anesthesiology

## 2018-07-10 DIAGNOSIS — M546 Pain in thoracic spine: Secondary | ICD-10-CM

## 2018-07-10 DIAGNOSIS — F1190 Opioid use, unspecified, uncomplicated: Secondary | ICD-10-CM | POA: Diagnosis not present

## 2018-07-10 DIAGNOSIS — G8929 Other chronic pain: Secondary | ICD-10-CM

## 2018-07-10 DIAGNOSIS — M5134 Other intervertebral disc degeneration, thoracic region: Secondary | ICD-10-CM | POA: Diagnosis not present

## 2018-07-10 DIAGNOSIS — G894 Chronic pain syndrome: Secondary | ICD-10-CM

## 2018-07-10 DIAGNOSIS — B0229 Other postherpetic nervous system involvement: Secondary | ICD-10-CM

## 2018-07-10 MED ORDER — HYDROCODONE-ACETAMINOPHEN 7.5-325 MG PO TABS: 1.0000 | ORAL_TABLET | Freq: Every day | ORAL | 0 refills | Status: AC

## 2018-07-10 NOTE — Progress Notes (Signed)
Virtual Visit via Video Note  I connected with Timothy Knapp on @TODAY @ at  9:00 AM EDT by a video enabled telemedicine application and verified that I am speaking with the correct person using two identifiers.  Location: Patient: Home Provider: Pain control center   I discussed the limitations of evaluation and management by telemedicine and the availability of in person appointments. The patient expressed understanding and agreed to proceed.  History of Present Illness: I spoke with [name] today via Risk analyst.  He feels that he has been doing well with his current regimen with the hydrocodone 7.5 mg tablets.  Presently he is taking 1/2 tablet at bedtime and can sometimes function with this alone and if he cannot sleep or continues to have pain he takes the other half tablet.  The quality characteristic and distribution of his pain remains stable in nature without change.  Based on our discussion he continues to derive good functional lifestyle benefit from the opioids with no side effects reported.    Observations/Objective: Current Outpatient Medications:  .  atorvastatin (LIPITOR) 40 MG tablet, Take 1 tablet (40 mg total) by mouth at bedtime., Disp: 90 tablet, Rfl: 4 .  b complex vitamins tablet, Take 1 tablet by mouth daily. , Disp: , Rfl:  .  benazepril (LOTENSIN) 20 MG tablet, Take 1 tablet (20 mg total) by mouth daily., Disp: 90 tablet, Rfl: 4 .  Capsaicin-Menthol (CAPZASIN QUICK RELIEF) 0.025-10 % GEL, Apply topically., Disp: , Rfl:  .  Cholecalciferol (VITAMIN D3) 25 MCG (1000 UT) CAPS, Take 1 capsule by mouth., Disp: , Rfl:  .  Cyanocobalamin (B-12 PO), Take 1 Dose by mouth daily., Disp: , Rfl:  .  gabapentin (NEURONTIN) 400 MG capsule, Take 1 capsule (400 mg total) by mouth 2 (two) times daily for 30 days., Disp: 60 capsule, Rfl: 1 .  HYDROcodone-acetaminophen (NORCO) 7.5-325 MG tablet, Take 1 tablet by mouth at bedtime., Disp: 30 tablet, Rfl: 0 .  [START ON  08/09/2018] HYDROcodone-acetaminophen (NORCO) 7.5-325 MG tablet, Take 1 tablet by mouth at bedtime., Disp: 30 tablet, Rfl: 0 .  Omega-3 Fatty Acids (OMEGA-3 PO), Take 788 mg by mouth daily., Disp: , Rfl:  .  vitamin C (ASCORBIC ACID) 500 MG tablet, Take 500 mg by mouth daily., Disp: , Rfl:    Assessment and Plan: 1. Postherpetic neuralgia   2. Chronic right-sided thoracic back pain   3. DDD (degenerative disc disease), thoracic   4. Chronic, continuous use of opioids   5. Chronic pain syndrome   Based on our discussion today and upon review of the [facility] practitioner database information going to refill his medications for his 7.5 mg tablets for July 2 and August 1.  I am continue at his current regimen.  Should he have any troubles in the meantime he is to contact us at the pain control center and he is to continue follow-up with his primary care physicians for his baseline medical care.  Scheduled for return to clinic in 2 months   Follow Up Instructions:    I discussed the assessment and treatment plan with the patient. The patient was provided an opportunity to ask questions and all were answered. The patient agreed with the plan and demonstrated an understanding of the instructions.   The patient was advised to call back or seek an in-person evaluation if the symptoms worsen or if the condition fails to improve as anticipated.  I provided 30 minutes of non-face-to-face time during  this encounter.   [name], MD

## 2018-08-06 ENCOUNTER — Ambulatory Visit (INDEPENDENT_AMBULATORY_CARE_PROVIDER_SITE_OTHER): Payer: PPO | Admitting: Family Medicine

## 2018-08-06 ENCOUNTER — Other Ambulatory Visit: Payer: Self-pay

## 2018-08-06 ENCOUNTER — Encounter: Payer: Self-pay | Admitting: Family Medicine

## 2018-08-06 DIAGNOSIS — E7800 Pure hypercholesterolemia, unspecified: Secondary | ICD-10-CM

## 2018-08-06 DIAGNOSIS — Z96651 Presence of right artificial knee joint: Secondary | ICD-10-CM

## 2018-08-06 DIAGNOSIS — B0229 Other postherpetic nervous system involvement: Secondary | ICD-10-CM

## 2018-08-06 DIAGNOSIS — I10 Essential (primary) hypertension: Secondary | ICD-10-CM

## 2018-08-06 NOTE — Assessment & Plan Note (Signed)
Limited ROM but doing better.

## 2018-08-06 NOTE — Assessment & Plan Note (Signed)
The current medical regimen is effective;  continue present plan and medications.  

## 2018-08-06 NOTE — Assessment & Plan Note (Signed)
Doing better.   

## 2018-08-06 NOTE — Progress Notes (Signed)
BP 133/78    Subjective:    Patient ID: Timothy Knapp, male    DOB: [birth year], 78 y.o.   MRN: [id number]  HPI: Timothy Knapp is a 78 y.o. male  Med check    Relevant past medical, surgical, family and social history reviewed and updated as indicated. Interim medical history since our last visit reviewed. Allergies and medications reviewed and updated.  Review of Systems  Constitutional: Negative.   Respiratory: Negative.   Cardiovascular: Negative.     Per HPI unless specifically indicated above     Objective:    BP 133/78   Wt Readings from Last 3 Encounters:  03/18/18 164 lb (74.4 kg)  02/05/18 163 lb 14.4 oz (74.3 kg)  12/25/17 160 lb (72.6 kg)    Physical Exam  Results for orders placed or performed in visit on 02/05/18  Microscopic Examination   URINE  Result Value Ref Range   WBC, UA 0-5 0 - 5 /hpf   RBC, UA None seen 0 - 2 /hpf   Epithelial Cells (non renal) 0-10 0 - 10 /hpf   Mucus, UA Present (A) Not Estab.   Bacteria, UA Few (A) None seen/Few  Comprehensive metabolic panel  Result Value Ref Range   Glucose 86 65 - 99 mg/dL   BUN 23 8 - 27 mg/dL   Creatinine, Ser 0.85 0.76 - 1.27 mg/dL   GFR calc non Af Amer 83 >59 mL/min/1.73   GFR calc Af Amer 96 >59 mL/min/1.73   BUN/Creatinine Ratio 27 (H) 10 - 24   Sodium 143 134 - 144 mmol/L   Potassium 4.8 3.5 - 5.2 mmol/L   Chloride 103 96 - 106 mmol/L   CO2 23 20 - 29 mmol/L   Calcium 9.6 8.6 - 10.2 mg/dL   Total Protein 6.9 6.0 - 8.5 g/dL   Albumin 4.6 3.7 - 4.7 g/dL   Globulin, Total 2.3 1.5 - 4.5 g/dL   Albumin/Globulin Ratio 2.0 1.2 - 2.2   Bilirubin Total 0.6 0.0 - 1.2 mg/dL   Alkaline Phosphatase 57 39 - 117 IU/L   AST 36 0 - 40 IU/L   ALT 28 0 - 44 IU/L  Lipid panel  Result Value Ref Range   Cholesterol, Total 181 100 - 199 mg/dL   Triglycerides 156 (H) 0 - 149 mg/dL   HDL 49 >39 mg/dL   VLDL Cholesterol Cal 31 5 - 40 mg/dL   LDL Calculated 101 (H) 0 - 99 mg/dL   Chol/HDL Ratio 3.7  0.0 - 5.0 ratio  CBC with Differential/Platelet  Result Value Ref Range   WBC 6.3 3.4 - 10.8 x10E3/uL   RBC 5.21 4.14 - 5.80 x10E6/uL   Hemoglobin 15.7 13.0 - 17.7 g/dL   Hematocrit 47.6 37.5 - 51.0 %   MCV 91 79 - 97 fL   MCH 30.1 26.6 - 33.0 pg   MCHC 33.0 31.5 - 35.7 g/dL   RDW 12.6 11.6 - 15.4 %   Platelets 188 150 - 450 x10E3/uL   Neutrophils 65 Not Estab. %   Lymphs 22 Not Estab. %   Monocytes 10 Not Estab. %   Eos 2 Not Estab. %   Basos 1 Not Estab. %   Neutrophils Absolute 4.2 1.4 - 7.0 x10E3/uL   Lymphocytes Absolute 1.4 0.7 - 3.1 x10E3/uL   Monocytes Absolute 0.6 0.1 - 0.9 x10E3/uL   EOS (ABSOLUTE) 0.1 0.0 - 0.4 x10E3/uL   Basophils Absolute 0.0 0.0 - 0.2 x10E3/uL  Immature Granulocytes 0 Not Estab. %   Immature Grans (Abs) 0.0 0.0 - 0.1 x10E3/uL  TSH  Result Value Ref Range   TSH 2.190 0.450 - 4.500 uIU/mL  Urinalysis, Routine w reflex microscopic  Result Value Ref Range   Specific Gravity, UA 1.025 1.005 - 1.030   pH, UA 5.5 5.0 - 7.5   Color, UA Yellow Yellow   Appearance Ur Clear Clear   Leukocytes, UA 1+ (A) Negative   Protein, UA Negative Negative/Trace   Glucose, UA Negative Negative   Ketones, UA Negative Negative   RBC, UA Trace (A) Negative   Bilirubin, UA Negative Negative   Urobilinogen, Ur 0.2 0.2 - 1.0 mg/dL   Nitrite, UA Negative Negative   Microscopic Examination See below:   PSA  Result Value Ref Range   Prostate Specific Ag, Serum 5.3 (H) 0.0 - 4.0 ng/mL      Assessment & Plan:   Problem List Items Addressed This Visit      Cardiovascular and Mediastinum   Hypertension    The current medical regimen is effective;  continue present plan and medications.       Relevant Orders   Basic metabolic panel     Nervous and Auditory   Postherpetic neuralgia    Doing better        Other   Hyperlipidemia    The current medical regimen is effective;  continue present plan and medications.       Relevant Orders   LP+ALT+AST  [place]col[place], Waived   S/P total knee arthroplasty    Limited ROM but doing better.        Telemedicine using audio/video telecommunications for a synchronous communication visit. Today's visit due to COVID-19 isolation precautions I connected with and verified that I am speaking with the correct person using two identifiers.   I discussed the limitations, risks, security and privacy concerns of performing an evaluation and management service by telecommunication and the availability of in person appointments. I also discussed with the patient that there may be a patient responsible charge related to this service. The patient expressed understanding and agreed to proceed. The patient's location is home. I am at home.   I discussed the assessment and treatment plan with the patient. The patient was provided an opportunity to ask questions and all were answered. The patient agreed with the plan and demonstrated an understanding of the instructions.   The patient was advised to call back or seek an in-person evaluation if the symptoms worsen or if the condition fails to improve as anticipated.   I provided 21+ minutes of time during this encounter.  Follow up plan: Return in about 6 months (around 02/06/2019) for Physical Exam.

## 2018-08-11 ENCOUNTER — Other Ambulatory Visit: Payer: PPO

## 2018-08-12 ENCOUNTER — Other Ambulatory Visit: Payer: PPO

## 2018-08-12 ENCOUNTER — Other Ambulatory Visit: Payer: Self-pay

## 2018-08-12 DIAGNOSIS — I10 Essential (primary) hypertension: Secondary | ICD-10-CM

## 2018-08-12 DIAGNOSIS — E7800 Pure hypercholesterolemia, unspecified: Secondary | ICD-10-CM | POA: Diagnosis not present

## 2018-08-12 LAB — LP+ALT+AST PICCOLO, WAIVED
ALT (SGPT) Piccolo, Waived: 27 U/L (ref 10–47)
AST (SGOT) Piccolo, Waived: 41 U/L — ABNORMAL HIGH (ref 11–38)
Chol/HDL Ratio Piccolo,Waive: 3.5 mg/dL
Cholesterol Piccolo, Waived: 179 mg/dL (ref <200)
HDL Chol Piccolo, Waived: 52 mg/dL — ABNORMAL LOW (ref >59)
LDL Chol Calc Piccolo Waived: 106 mg/dL — ABNORMAL HIGH (ref <100)
Triglycerides Piccolo,Waived: 110 mg/dL (ref <150)
VLDL Chol Calc Piccolo,Waive: 22 mg/dL (ref <30)

## 2018-08-13 LAB — BASIC METABOLIC PANEL
BUN/Creatinine Ratio: 32 — ABNORMAL HIGH (ref 10–24)
BUN: 26 mg/dL (ref 8–27)
CO2: 19 mmol/L — ABNORMAL LOW (ref 20–29)
Calcium: 9 mg/dL (ref 8.6–10.2)
Chloride: 107 mmol/L — ABNORMAL HIGH (ref 96–106)
Creatinine, Ser: 0.82 mg/dL (ref 0.76–1.27)
GFR calc Af Amer: 98 mL/min/{1.73_m2} (ref >59)
GFR calc non Af Amer: 85 mL/min/{1.73_m2} (ref >59)
Glucose: 83 mg/dL (ref 65–99)
Potassium: 5 mmol/L (ref 3.5–5.2)
Sodium: 143 mmol/L (ref 134–144)

## 2018-08-18 ENCOUNTER — Encounter: Payer: Self-pay | Admitting: Family Medicine

## 2018-09-10 ENCOUNTER — Other Ambulatory Visit: Payer: Self-pay

## 2018-09-10 ENCOUNTER — Ambulatory Visit: Payer: PPO | Attending: Anesthesiology | Admitting: Anesthesiology

## 2018-09-10 ENCOUNTER — Encounter: Payer: Self-pay | Admitting: Anesthesiology

## 2018-09-10 DIAGNOSIS — M546 Pain in thoracic spine: Secondary | ICD-10-CM

## 2018-09-10 DIAGNOSIS — B0229 Other postherpetic nervous system involvement: Secondary | ICD-10-CM | POA: Diagnosis not present

## 2018-09-10 DIAGNOSIS — M5134 Other intervertebral disc degeneration, thoracic region: Secondary | ICD-10-CM | POA: Diagnosis not present

## 2018-09-10 DIAGNOSIS — G8929 Other chronic pain: Secondary | ICD-10-CM

## 2018-09-10 DIAGNOSIS — G894 Chronic pain syndrome: Secondary | ICD-10-CM

## 2018-09-10 DIAGNOSIS — F1190 Opioid use, unspecified, uncomplicated: Secondary | ICD-10-CM | POA: Diagnosis not present

## 2018-09-10 MED ORDER — HYDROCODONE-ACETAMINOPHEN 7.5-325 MG PO TABS: 1.0000 | ORAL_TABLET | Freq: Every day | ORAL | 0 refills | Status: AC

## 2018-09-10 MED ORDER — HYDROCODONE-ACETAMINOPHEN 7.5-325 MG PO TABS: 1.0000 | ORAL_TABLET | Freq: Every day | ORAL | 0 refills | Status: DC

## 2018-09-10 NOTE — Progress Notes (Signed)
Virtual Visit via Telephone Note  I connected with Timothy Knapp on 09/10/18 at  2:15 PM EDT by telephone and verified that I am speaking with the correct person using two identifiers.  Location: Patient: Home Provider: [facility] control center   I discussed the limitations, risks, security and privacy concerns of performing an evaluation and management service by telephone and the availability of in person appointments. I also discussed with the patient that there may be a patient responsible charge related to this service. The patient expressed understanding and agreed to proceed.   History of Present Illness: Spoke with Timothy Knapp today via telephone conferencing.  He states that he has been doing well in regards to his basic pain syndrome.  The quality characteristic distribution of his right side thoracic pain and postherpetic neuralgia as well as intermittent low back pain have been stable.  He is taking one half of a hydrocodone tablet per day and this is working well for him.  He has been able to cut back on his gabapentin as well.  He is using Naprosyn once or twice a day and this is working well.  Otherwise the quality characteristic and distribution of his pain syndrome have been stable with no new changes documented today.  He is mentioning that he is having some mild edema and discomfort around the total knee arthroplasty he had approximately 18 months ago.  No significant redness or erythema is noted.  Has been able to ambulate without difficulty.  The Naprosyn helps with this pain.  He has been running no fevers with no chills.   Observations/Objective:  Current Outpatient Medications:  .  atorvastatin (LIPITOR) 40 MG tablet, Take 1 tablet (40 mg total) by mouth at bedtime., Disp: 90 tablet, Rfl: 4 .  b complex vitamins tablet, Take 1 tablet by mouth daily. , Disp: , Rfl:  .  benazepril (LOTENSIN) 20 MG tablet, Take 1 tablet (20 mg total) by mouth daily., Disp: 90 tablet, Rfl: 4 .   Capsaicin-Menthol (CAPZASIN QUICK RELIEF) 0.025-10 % GEL, Apply topically., Disp: , Rfl:  .  Cholecalciferol (VITAMIN D3) 25 MCG (1000 UT) CAPS, Take 1 capsule by mouth., Disp: , Rfl:  .  Cyanocobalamin (B-12 PO), Take 1 Dose by mouth daily., Disp: , Rfl:  .  gabapentin (NEURONTIN) 400 MG capsule, Take 1 capsule (400 mg total) by mouth 2 (two) times daily for 30 days., Disp: 60 capsule, Rfl: 1 .  HYDROcodone-acetaminophen (NORCO) 7.5-325 MG tablet, Take 1 tablet by mouth daily., Disp: 30 tablet, Rfl: 0 .  Omega-3 Fatty Acids (OMEGA-3 PO), Take 788 mg by mouth daily., Disp: , Rfl:  .  vitamin C (ASCORBIC ACID) 500 MG tablet, Take 500 mg by mouth daily., Disp: , Rfl:   Assessment and Plan: 1. Postherpetic neuralgia   2. Chronic right-sided thoracic back pain   3. DDD (degenerative disc disease), thoracic   4. Chronic, continuous use of opioids   5. Chronic pain syndrome   6.  Knee pain status post arthroplasty Based on the above findings I am going to refill his medications today.  He has been compliant with his regimen and attempting to reduce his opioid usage very appropriately.  Refill will be dated for today for 30 tablets.  He is scheduled for return to clinic in 2 months.  Furthermore regarding his knee I have encouraged him to follow-up with his orthopedist especially if the swelling continues or if he has any warmth or erythema affecting the joint.  I also encouraged him to follow-up with his primary care physicians for his baseline medical care.  Follow Up Instructions:    I discussed the assessment and treatment plan with the patient. The patient was provided an opportunity to ask questions and all were answered. The patient agreed with the plan and demonstrated an understanding of the instructions.   The patient was advised to call back or seek an in-person evaluation if the symptoms worsen or if the condition fails to improve as anticipated.  I provided 30 minutes of  non-face-to-face time during this encounter.   [name], MD

## 2018-10-14 DIAGNOSIS — H903 Sensorineural hearing loss, bilateral: Secondary | ICD-10-CM | POA: Diagnosis not present

## 2018-11-06 ENCOUNTER — Ambulatory Visit (INDEPENDENT_AMBULATORY_CARE_PROVIDER_SITE_OTHER): Payer: PPO | Admitting: Family Medicine

## 2018-11-06 DIAGNOSIS — I10 Essential (primary) hypertension: Secondary | ICD-10-CM

## 2018-11-06 DIAGNOSIS — R195 Other fecal abnormalities: Secondary | ICD-10-CM | POA: Diagnosis not present

## 2018-11-06 DIAGNOSIS — Z79899 Other long term (current) drug therapy: Secondary | ICD-10-CM | POA: Diagnosis not present

## 2018-11-06 DIAGNOSIS — R5383 Other fatigue: Secondary | ICD-10-CM | POA: Diagnosis not present

## 2018-11-06 DIAGNOSIS — R82998 Other abnormal findings in urine: Secondary | ICD-10-CM | POA: Diagnosis not present

## 2018-11-07 NOTE — Progress Notes (Signed)
Apt was not seen, went to [facility] walk in.

## 2018-11-07 NOTE — Assessment & Plan Note (Signed)
The current medical regimen is effective;  continue present plan and medications.  

## 2018-12-09 DIAGNOSIS — Z96651 Presence of right artificial knee joint: Secondary | ICD-10-CM | POA: Diagnosis not present

## 2018-12-09 DIAGNOSIS — M25561 Pain in right knee: Secondary | ICD-10-CM | POA: Diagnosis not present

## 2018-12-11 DIAGNOSIS — R159 Full incontinence of feces: Secondary | ICD-10-CM | POA: Diagnosis not present

## 2018-12-11 DIAGNOSIS — K5730 Diverticulosis of large intestine without perforation or abscess without bleeding: Secondary | ICD-10-CM | POA: Diagnosis not present

## 2018-12-11 DIAGNOSIS — M5134 Other intervertebral disc degeneration, thoracic region: Secondary | ICD-10-CM | POA: Diagnosis not present

## 2018-12-11 DIAGNOSIS — R195 Other fecal abnormalities: Secondary | ICD-10-CM | POA: Diagnosis not present

## 2018-12-11 DIAGNOSIS — B0229 Other postherpetic nervous system involvement: Secondary | ICD-10-CM | POA: Diagnosis not present

## 2018-12-11 DIAGNOSIS — K6289 Other specified diseases of anus and rectum: Secondary | ICD-10-CM | POA: Diagnosis not present

## 2018-12-11 DIAGNOSIS — Z96651 Presence of right artificial knee joint: Secondary | ICD-10-CM | POA: Diagnosis not present

## 2018-12-17 ENCOUNTER — Ambulatory Visit: Payer: PPO | Attending: Anesthesiology | Admitting: Anesthesiology

## 2018-12-17 ENCOUNTER — Encounter: Payer: Self-pay | Admitting: Anesthesiology

## 2018-12-17 ENCOUNTER — Other Ambulatory Visit: Payer: Self-pay

## 2018-12-17 DIAGNOSIS — G8929 Other chronic pain: Secondary | ICD-10-CM | POA: Diagnosis not present

## 2018-12-17 DIAGNOSIS — G894 Chronic pain syndrome: Secondary | ICD-10-CM

## 2018-12-17 DIAGNOSIS — F1190 Opioid use, unspecified, uncomplicated: Secondary | ICD-10-CM

## 2018-12-17 DIAGNOSIS — M5134 Other intervertebral disc degeneration, thoracic region: Secondary | ICD-10-CM | POA: Diagnosis not present

## 2018-12-17 DIAGNOSIS — B0229 Other postherpetic nervous system involvement: Secondary | ICD-10-CM

## 2018-12-17 DIAGNOSIS — M546 Pain in thoracic spine: Secondary | ICD-10-CM | POA: Diagnosis not present

## 2018-12-17 MED ORDER — HYDROCODONE-ACETAMINOPHEN 7.5-325 MG PO TABS: 1.0000 | ORAL_TABLET | Freq: Two times a day (BID) | ORAL | 0 refills | Status: AC

## 2018-12-18 NOTE — Progress Notes (Signed)
Virtual Visit via Telephone Note  I connected with Timothy Knapp on 12/18/18 at  2:00 PM EST by telephone and verified that I am speaking with the correct person using two identifiers.  Location: Patient: Home Provider: Pain control center   I discussed the limitations, risks, security and privacy concerns of performing an evaluation and management service by telephone and the availability of in person appointments. I also discussed with the patient that there may be a patient responsible charge related to this service. The patient expressed understanding and agreed to proceed.   History of Present Illness: I spoke with Timothy Knapp today via telephone as he was unable to do the video conferencing for his virtual appointment.  He reports that he has had some recurrence of his baseline pain similar to what he experienced in the past.  He has been able to wean his Vicodin to an average of less than 1 tablet/day.  However the pain has intensified to the point where he feels it more necessary to take this once a day.  He is also having trouble with sleep at night.  In the past he was on gabapentin.  He also took duloxetine.  He has been trying melatonin with limited success.  He generally has breakthrough insomnia 3 to 4 hours after falling asleep.  No new changes in the quality characteristic and distribution of the pain are otherwise reported at this time.    Observations/Objective:  Current Outpatient Medications:  .  atorvastatin (LIPITOR) 40 MG tablet, Take 1 tablet (40 mg total) by mouth at bedtime., Disp: 90 tablet, Rfl: 4 .  b complex vitamins tablet, Take 1 tablet by mouth daily. , Disp: , Rfl:  .  benazepril (LOTENSIN) 20 MG tablet, Take 1 tablet (20 mg total) by mouth daily., Disp: 90 tablet, Rfl: 4 .  Capsaicin-Menthol (CAPZASIN QUICK RELIEF) 0.025-10 % GEL, Apply topically., Disp: , Rfl:  .  Cholecalciferol (VITAMIN D3) 25 MCG (1000 UT) CAPS, Take 1 capsule by mouth., Disp: , Rfl:  .   Cyanocobalamin (B-12 PO), Take 1 Dose by mouth daily., Disp: , Rfl:  .  gabapentin (NEURONTIN) 400 MG capsule, Take 1 capsule (400 mg total) by mouth 2 (two) times daily for 30 days., Disp: 60 capsule, Rfl: 1 .  HYDROcodone-acetaminophen (NORCO) 7.5-325 MG tablet, Take 1 tablet by mouth 2 (two) times daily., Disp: 60 tablet, Rfl: 0 .  Omega-3 Fatty Acids (OMEGA-3 PO), Take 788 mg by mouth daily., Disp: , Rfl:  .  vitamin C (ASCORBIC ACID) 500 MG tablet, Take 500 mg by mouth daily., Disp: , Rfl:   Assessment and Plan:  1. Postherpetic neuralgia   2. Chronic right-sided thoracic back pain   3. DDD (degenerative disc disease), thoracic   4. Chronic, continuous use of opioids   5. Chronic pain syndrome   Based on a review today I am going to refill his medication and I think it is reasonable to move him back to the once a day dosing on his opioids.  I have talked to him about options regarding gabapentin 400 mg at bedtime to see if this could help with his pain control and insomnia.  Otherwise I think he stable on this regimen with the once a day dosing opioid and he has been very responsible with this.  We will schedule him for 62-month return to clinic with continued follow-up with his primary care physicians for his baseline medical care. Follow Up Instructions:    I discussed  the assessment and treatment plan with the patient. The patient was provided an opportunity to ask questions and all were answered. The patient agreed with the plan and demonstrated an understanding of the instructions.   The patient was advised to call back or seek an in-person evaluation if the symptoms worsen or if the condition fails to improve as anticipated.  I provided 30 minutes of non-face-to-face time during this encounter.   [name], MD

## 2019-02-11 ENCOUNTER — Encounter: Payer: Self-pay | Admitting: Nurse Practitioner

## 2019-02-11 DIAGNOSIS — M5134 Other intervertebral disc degeneration, thoracic region: Secondary | ICD-10-CM | POA: Diagnosis not present

## 2019-02-11 DIAGNOSIS — K5730 Diverticulosis of large intestine without perforation or abscess without bleeding: Secondary | ICD-10-CM | POA: Diagnosis not present

## 2019-02-11 DIAGNOSIS — K6289 Other specified diseases of anus and rectum: Secondary | ICD-10-CM | POA: Diagnosis not present

## 2019-02-11 DIAGNOSIS — B0229 Other postherpetic nervous system involvement: Secondary | ICD-10-CM | POA: Diagnosis not present

## 2019-02-11 DIAGNOSIS — R159 Full incontinence of feces: Secondary | ICD-10-CM | POA: Diagnosis not present

## 2019-02-11 DIAGNOSIS — R195 Other fecal abnormalities: Secondary | ICD-10-CM | POA: Diagnosis not present

## 2019-02-18 DIAGNOSIS — E785 Hyperlipidemia, unspecified: Secondary | ICD-10-CM | POA: Diagnosis not present

## 2019-02-18 DIAGNOSIS — N529 Male erectile dysfunction, unspecified: Secondary | ICD-10-CM | POA: Diagnosis not present

## 2019-02-18 DIAGNOSIS — Z0000 Encounter for general adult medical examination without abnormal findings: Secondary | ICD-10-CM | POA: Diagnosis not present

## 2019-02-18 DIAGNOSIS — I10 Essential (primary) hypertension: Secondary | ICD-10-CM | POA: Diagnosis not present

## 2019-02-19 ENCOUNTER — Other Ambulatory Visit: Payer: Self-pay

## 2019-02-19 ENCOUNTER — Ambulatory Visit: Payer: PPO | Attending: Anesthesiology | Admitting: Anesthesiology

## 2019-02-19 ENCOUNTER — Encounter: Payer: Self-pay | Admitting: Anesthesiology

## 2019-02-19 DIAGNOSIS — F1190 Opioid use, unspecified, uncomplicated: Secondary | ICD-10-CM | POA: Diagnosis not present

## 2019-02-19 DIAGNOSIS — B0229 Other postherpetic nervous system involvement: Secondary | ICD-10-CM

## 2019-02-19 DIAGNOSIS — M5134 Other intervertebral disc degeneration, thoracic region: Secondary | ICD-10-CM

## 2019-02-19 DIAGNOSIS — M546 Pain in thoracic spine: Secondary | ICD-10-CM | POA: Diagnosis not present

## 2019-02-19 DIAGNOSIS — G894 Chronic pain syndrome: Secondary | ICD-10-CM | POA: Diagnosis not present

## 2019-02-19 DIAGNOSIS — G8929 Other chronic pain: Secondary | ICD-10-CM

## 2019-02-19 MED ORDER — HYDROCODONE-ACETAMINOPHEN 7.5-325 MG PO TABS: 1.0000 | ORAL_TABLET | Freq: Two times a day (BID) | ORAL | 0 refills | Status: AC

## 2019-02-19 NOTE — Progress Notes (Signed)
Virtual Visit via Video Note  I connected with [name] on 02/19/19 at  2:15 PM EST by a video enabled telemedicine application and verified that I am speaking with the correct person using two identifiers.  Location: Patient: Home Timothy Knapp: Pain control center   I discussed the limitations of evaluation and management by telemedicine and the availability of in person appointments. The patient expressed understanding and agreed to proceed.  History of Present Illness: I spoke with Mr. Collinson regarding his current pain syndrome via video conferencing for his virtual appointment.  He reports that he is doing reasonably well with his pain control at present.  He still taking the hydrocodone once a day occasionally twice a day and getting good relief and this seems to keep the pain under good management.  No changes in the quality character or distribution of the pain are noted at this time.  Otherwise has been doing well and pleased with his current circumstances.  He does mention that he is able to begin a new PCP relationship with Dr. Frazier Richards and this is pending.  In relation to his back pain and chest wall pain the quality characteristic and distribution have been stable.  No changes in strength or function are reported and he is tolerating the medications without difficulty with no side effects reported at this time.  He is getting good functional lifestyle improvement and sleeps better with the medication with better overall management of his pain then with previous modalities.    Observations/Objective:  Current Outpatient Medications:  .  atorvastatin (LIPITOR) 40 MG tablet, Take 1 tablet (40 mg total) by mouth at bedtime., Disp: 90 tablet, Rfl: 4 .  b complex vitamins tablet, Take 1 tablet by mouth daily. , Disp: , Rfl:  .  benazepril (LOTENSIN) 20 MG tablet, Take 1 tablet (20 mg total) by mouth daily., Disp: 90 tablet, Rfl: 4 .  Capsaicin-Menthol (CAPZASIN QUICK RELIEF)  0.025-10 % GEL, Apply topically., Disp: , Rfl:  .  Cholecalciferol (VITAMIN D3) 25 MCG (1000 UT) CAPS, Take 1 capsule by mouth., Disp: , Rfl:  .  Cyanocobalamin (B-12 PO), Take 1 Dose by mouth daily., Disp: , Rfl:  .  gabapentin (NEURONTIN) 400 MG capsule, Take 1 capsule (400 mg total) by mouth 2 (two) times daily for 30 days., Disp: 60 capsule, Rfl: 1 .  HYDROcodone-acetaminophen (NORCO) 7.5-325 MG tablet, Take 1 tablet by mouth 2 (two) times daily., Disp: 60 tablet, Rfl: 0 .  Omega-3 Fatty Acids (OMEGA-3 PO), Take 788 mg by mouth daily., Disp: , Rfl:  .  vitamin C (ASCORBIC ACID) 500 MG tablet, Take 500 mg by mouth daily., Disp: , Rfl:   Assessment and Plan: 1. Postherpetic neuralgia   2. Chronic right-sided thoracic back pain   3. DDD (degenerative disc disease), thoracic   4. Chronic pain syndrome   5. Chronic, continuous use of opioids   Based on our discussion today and upon review of the [place] practitioner database information I am going to refill his medications for February 11.  This will be for 60 tablets for twice daily dosing if indicated or necessary.  It will be for as needed use and I want him to continue with his current medical and therapeutic regimen.  No changes will be made.  We will schedule him for 33-month return to clinic for follow-up.  I want him to proceed with his appointment with Dr. Ouida Sills for his baseline medical care.  Follow Up Instructions:  I discussed the assessment and treatment plan with the patient. The patient was provided an opportunity to ask questions and all were answered. The patient agreed with the plan and demonstrated an understanding of the instructions.   The patient was advised to call back or seek an in-person evaluation if the symptoms worsen or if the condition fails to improve as anticipated.  I provided 30 minutes of non-face-to-face time during this encounter.   [name], MD

## 2019-02-24 ENCOUNTER — Encounter: Payer: PPO | Admitting: Anesthesiology

## 2019-03-17 DIAGNOSIS — N401 Enlarged prostate with lower urinary tract symptoms: Secondary | ICD-10-CM | POA: Diagnosis not present

## 2019-03-17 DIAGNOSIS — Z79899 Other long term (current) drug therapy: Secondary | ICD-10-CM | POA: Diagnosis not present

## 2019-03-17 DIAGNOSIS — N5201 Erectile dysfunction due to arterial insufficiency: Secondary | ICD-10-CM | POA: Diagnosis not present

## 2019-03-17 DIAGNOSIS — Z125 Encounter for screening for malignant neoplasm of prostate: Secondary | ICD-10-CM | POA: Diagnosis not present

## 2019-03-18 DIAGNOSIS — Z79899 Other long term (current) drug therapy: Secondary | ICD-10-CM | POA: Diagnosis not present

## 2019-03-18 DIAGNOSIS — N401 Enlarged prostate with lower urinary tract symptoms: Secondary | ICD-10-CM | POA: Diagnosis not present

## 2019-03-18 DIAGNOSIS — N5201 Erectile dysfunction due to arterial insufficiency: Secondary | ICD-10-CM | POA: Diagnosis not present

## 2019-03-18 DIAGNOSIS — Z125 Encounter for screening for malignant neoplasm of prostate: Secondary | ICD-10-CM | POA: Diagnosis not present

## 2019-03-23 ENCOUNTER — Other Ambulatory Visit: Payer: Self-pay | Admitting: Urology

## 2019-03-23 DIAGNOSIS — R972 Elevated prostate specific antigen [PSA]: Secondary | ICD-10-CM | POA: Diagnosis not present

## 2019-03-27 DIAGNOSIS — L570 Actinic keratosis: Secondary | ICD-10-CM | POA: Diagnosis not present

## 2019-03-27 DIAGNOSIS — D1801 Hemangioma of skin and subcutaneous tissue: Secondary | ICD-10-CM | POA: Diagnosis not present

## 2019-03-27 DIAGNOSIS — L853 Xerosis cutis: Secondary | ICD-10-CM | POA: Diagnosis not present

## 2019-03-27 DIAGNOSIS — Z85828 Personal history of other malignant neoplasm of skin: Secondary | ICD-10-CM | POA: Diagnosis not present

## 2019-03-27 DIAGNOSIS — L821 Other seborrheic keratosis: Secondary | ICD-10-CM | POA: Diagnosis not present

## 2019-03-27 DIAGNOSIS — L820 Inflamed seborrheic keratosis: Secondary | ICD-10-CM | POA: Diagnosis not present

## 2019-04-02 ENCOUNTER — Ambulatory Visit: Payer: PPO

## 2019-04-07 ENCOUNTER — Other Ambulatory Visit: Payer: Self-pay

## 2019-04-07 ENCOUNTER — Ambulatory Visit
Admission: RE | Admit: 2019-04-07 | Discharge: 2019-04-07 | Disposition: A | Discharge status: Home / Self Care | Payer: PPO | Source: Ambulatory Visit | Attending: Urology | Admitting: Urology

## 2019-04-07 DIAGNOSIS — R972 Elevated prostate specific antigen [PSA]: Secondary | ICD-10-CM

## 2019-04-07 LAB — POCT I-STAT CREATININE: Creatinine, Ser: 1.1 mg/dL (ref 0.61–1.24)

## 2019-04-07 MED ORDER — GADOBUTROL 1 MMOL/ML IV SOLN
7.0000 mL | Freq: Once | INTRAVENOUS | Status: AC | PRN
  Administered 2019-04-07: 16:00:00 7 mL via INTRAVENOUS

## 2019-04-14 DIAGNOSIS — N5201 Erectile dysfunction due to arterial insufficiency: Secondary | ICD-10-CM | POA: Diagnosis not present

## 2019-04-14 DIAGNOSIS — N401 Enlarged prostate with lower urinary tract symptoms: Secondary | ICD-10-CM | POA: Diagnosis not present

## 2019-04-14 DIAGNOSIS — R972 Elevated prostate specific antigen [PSA]: Secondary | ICD-10-CM | POA: Diagnosis not present

## 2019-04-20 ENCOUNTER — Other Ambulatory Visit: Payer: Self-pay

## 2019-04-20 DIAGNOSIS — E7800 Pure hypercholesterolemia, unspecified: Secondary | ICD-10-CM

## 2019-04-20 DIAGNOSIS — I10 Essential (primary) hypertension: Secondary | ICD-10-CM

## 2019-04-20 MED ORDER — BENAZEPRIL HCL 20 MG PO TABS: 20.0000 mg | ORAL_TABLET | Freq: Every day | ORAL | 0 refills | Status: DC

## 2019-04-20 MED ORDER — ATORVASTATIN CALCIUM 40 MG PO TABS: 40.0000 mg | ORAL_TABLET | Freq: Every day | ORAL | 0 refills | Status: DC

## 2019-04-20 NOTE — Telephone Encounter (Signed)
LOV: 11/06/2019 with Golden Pop, MD NOV: not scheduled.

## 2019-04-20 NOTE — Telephone Encounter (Signed)
Pt. Verbalized understanding, Pt stated he would call to make this apt.

## 2019-04-28 ENCOUNTER — Telehealth: Payer: Self-pay | Admitting: Nurse Practitioner

## 2019-04-28 NOTE — Telephone Encounter (Signed)
Monica, pharmacist with [facility] notified of Jolene's message and verbalized understanding.

## 2019-04-28 NOTE — Telephone Encounter (Signed)
Routing to provider for advise

## 2019-04-28 NOTE — Telephone Encounter (Signed)
atorvastatin (LIPITOR) 40 MG tablet    Monica, pharmacist with [facility], calling to request a new RX be sent for patient reflecting the dosage that patient is actually taking. Brayton Layman reports that patient is taking half a tablet of this medication rather than the full tablet as written.

## 2019-04-28 NOTE — Telephone Encounter (Signed)
On review it appears patient has established care with Dr. Frazier Richards at [facility], they will need to be notified of this need.  Thank you.

## 2019-05-06 ENCOUNTER — Ambulatory Visit: Payer: PPO | Attending: Anesthesiology | Admitting: Anesthesiology

## 2019-05-06 ENCOUNTER — Other Ambulatory Visit: Payer: Self-pay

## 2019-05-06 DIAGNOSIS — M5134 Other intervertebral disc degeneration, thoracic region: Secondary | ICD-10-CM

## 2019-05-06 DIAGNOSIS — F1190 Opioid use, unspecified, uncomplicated: Secondary | ICD-10-CM | POA: Diagnosis not present

## 2019-05-06 DIAGNOSIS — M546 Pain in thoracic spine: Secondary | ICD-10-CM

## 2019-05-06 DIAGNOSIS — G8929 Other chronic pain: Secondary | ICD-10-CM

## 2019-05-06 DIAGNOSIS — G894 Chronic pain syndrome: Secondary | ICD-10-CM

## 2019-05-06 DIAGNOSIS — B0229 Other postherpetic nervous system involvement: Secondary | ICD-10-CM

## 2019-05-06 MED ORDER — HYDROCODONE-ACETAMINOPHEN 7.5-325 MG PO TABS: 1.0000 | ORAL_TABLET | Freq: Two times a day (BID) | ORAL | 0 refills | Status: AC | PRN

## 2019-05-06 NOTE — Progress Notes (Signed)
Virtual Visit via Telephone Note  I connected with Timothy Knapp on 05/06/19 at 10:15 AM EDT by telephone and verified that I am speaking with the correct person using two identifiers.  Location: Patient: Home Provider: Pain control center   I discussed the limitations, risks, security and privacy concerns of performing an evaluation and management service by telephone and the availability of in person appointments. I also discussed with the patient that there may be a patient responsible charge related to this service. The patient expressed understanding and agreed to proceed.   History of Present Illness: I spoke with Timothy Knapp today via telephone as he was unable to do the video portion of the virtual conference. He reports that his right side thoracic pain is stable in nature with no significant changes reported. The quality characteristic and distribution have been stable as well. He is generally taking approximately one half of a hydrocodone tablet twice a day and rarely is requiring much more than this. He is no longer using the gabapentin secondary to side effects. The hydrocodone is well-tolerated. He is try to do some stretching strengthening exercises as tolerated and feels like he is making decent progress and that the opioid medication has been the only thing that is functionally enhanced his lifestyle. No side effects reported and he is feeling that this is worked well for him. Otherwise he is in his usual state of health today.    Observations/Objective:  Current Outpatient Medications:  .  atorvastatin (LIPITOR) 40 MG tablet, Take 1 tablet (40 mg total) by mouth at bedtime., Disp: 90 tablet, Rfl: 0 .  b complex vitamins tablet, Take 1 tablet by mouth daily. , Disp: , Rfl:  .  benazepril (LOTENSIN) 20 MG tablet, Take 1 tablet (20 mg total) by mouth daily., Disp: 90 tablet, Rfl: 0 .  Capsaicin-Menthol (CAPZASIN QUICK RELIEF) 0.025-10 % GEL, Apply topically., Disp: , Rfl:  .   Cholecalciferol (VITAMIN D3) 25 MCG (1000 UT) CAPS, Take 1 capsule by mouth., Disp: , Rfl:  .  Cyanocobalamin (B-12 PO), Take 1 Dose by mouth daily., Disp: , Rfl:  .  gabapentin (NEURONTIN) 400 MG capsule, Take 1 capsule (400 mg total) by mouth 2 (two) times daily for 30 days., Disp: 60 capsule, Rfl: 1 .  HYDROcodone-acetaminophen (NORCO) 7.5-325 MG tablet, Take 1 tablet by mouth 2 (two) times daily as needed for moderate pain or severe pain., Disp: 60 tablet, Rfl: 0 .  Omega-3 Fatty Acids (OMEGA-3 PO), Take 788 mg by mouth daily., Disp: , Rfl:  .  vitamin C (ASCORBIC ACID) 500 MG tablet, Take 500 mg by mouth daily., Disp: , Rfl:   Assessment and Plan:  1. Postherpetic neuralgia   2. Chronic right-sided thoracic back pain   3. DDD (degenerative disc disease), thoracic   4. Chronic pain syndrome   5. Chronic, continuous use of opioids   Based on our discussion today I think it is appropriate to refill his medications. This will be for his hydrocodone. I want him continue with his current regimen with stretching strengthening exercises well and continue follow-up with his primary care physicians. We will schedule him for 72-month return to clinic. Unfortunately secondary to the database being down I was unable to review this today. He is scheduled for routine UDS and then next few weeks. Follow Up Instructions:    I discussed the assessment and treatment plan with the patient. The patient was provided an opportunity to ask questions and all were answered.  The patient agreed with the plan and demonstrated an understanding of the instructions.   The patient was advised to call back or seek an in-person evaluation if the symptoms worsen or if the condition fails to improve as anticipated.  I provided 25 minutes of non-face-to-face time during this encounter.   [name], MD

## 2019-07-17 ENCOUNTER — Other Ambulatory Visit: Payer: Self-pay | Admitting: Nurse Practitioner

## 2019-07-17 DIAGNOSIS — I10 Essential (primary) hypertension: Secondary | ICD-10-CM

## 2019-07-17 DIAGNOSIS — E7800 Pure hypercholesterolemia, unspecified: Secondary | ICD-10-CM

## 2019-07-17 NOTE — Telephone Encounter (Signed)
Routing to provider  

## 2019-07-17 NOTE — Telephone Encounter (Signed)
Requested  medications are  due for refill today yes  Requested medications are on the active medication list yes  Last refill 4/12  Last visit Oct 2020  Future visit scheduled no  Notes to clinic PCP Frazier Richards

## 2019-08-12 ENCOUNTER — Encounter: Payer: Self-pay | Admitting: Anesthesiology

## 2019-08-12 ENCOUNTER — Other Ambulatory Visit: Payer: Self-pay

## 2019-08-12 ENCOUNTER — Ambulatory Visit: Payer: PPO | Attending: Anesthesiology | Admitting: Anesthesiology

## 2019-08-12 VITALS — BP 128/75 | HR 57 | Temp 97.0°F | Resp 18 | Ht 68.0 in | Wt 160.0 lb

## 2019-08-12 DIAGNOSIS — M546 Pain in thoracic spine: Secondary | ICD-10-CM | POA: Diagnosis not present

## 2019-08-12 DIAGNOSIS — G8929 Other chronic pain: Secondary | ICD-10-CM | POA: Diagnosis not present

## 2019-08-12 DIAGNOSIS — M5134 Other intervertebral disc degeneration, thoracic region: Secondary | ICD-10-CM | POA: Diagnosis not present

## 2019-08-12 DIAGNOSIS — E785 Hyperlipidemia, unspecified: Secondary | ICD-10-CM | POA: Diagnosis not present

## 2019-08-12 DIAGNOSIS — F1190 Opioid use, unspecified, uncomplicated: Secondary | ICD-10-CM | POA: Diagnosis not present

## 2019-08-12 DIAGNOSIS — B0229 Other postherpetic nervous system involvement: Secondary | ICD-10-CM

## 2019-08-12 DIAGNOSIS — G894 Chronic pain syndrome: Secondary | ICD-10-CM | POA: Insufficient documentation

## 2019-08-12 DIAGNOSIS — Z0000 Encounter for general adult medical examination without abnormal findings: Secondary | ICD-10-CM | POA: Diagnosis not present

## 2019-08-12 DIAGNOSIS — Z125 Encounter for screening for malignant neoplasm of prostate: Secondary | ICD-10-CM | POA: Diagnosis not present

## 2019-08-12 DIAGNOSIS — I10 Essential (primary) hypertension: Secondary | ICD-10-CM | POA: Diagnosis not present

## 2019-08-12 DIAGNOSIS — R972 Elevated prostate specific antigen [PSA]: Secondary | ICD-10-CM | POA: Diagnosis not present

## 2019-08-12 MED ORDER — HYDROCODONE-ACETAMINOPHEN 7.5-325 MG PO TABS: 1.0000 | ORAL_TABLET | Freq: Two times a day (BID) | ORAL | 0 refills | Status: AC | PRN

## 2019-08-12 NOTE — Progress Notes (Signed)
Nursing Pain Medication Assessment:  Safety precautions to be maintained throughout the outpatient stay will include: orient to surroundings, keep bed in low position, maintain call bell within reach at all times, provide assistance with transfer out of bed and ambulation.  Medication Inspection Compliance: Pill count conducted under aseptic conditions, in front of the patient. Neither the pills nor the bottle was removed from the patient's sight at any time. Once count was completed pills were immediately returned to the patient in their original bottle.  Medication: Hydrocodone/APAP Pill/Patch Count: 3 of 60 pills remain Pill/Patch Appearance: Markings consistent with prescribed medication Bottle Appearance: Standard pharmacy container. Clearly labeled. Filled Date: 05/06/2019  Last Medication intake:  Today

## 2019-08-12 NOTE — Progress Notes (Signed)
Subjective:  Patient ID: Timothy Knapp, male    DOB: 1940/04/04  Age: 79 y.o. MRN: [id number]  CC: Back Pain (low)   Procedure: None  HPI Timothy Knapp presents for reevaluation.  He was last seen a few months ago and has been doing well with his right side thoracic back pain with postherpetic neuralgia.  He still has some tenderness to touch over that area similar to what he is experienced chronically.  No significant changes are mentioned in the quality characteristic or distribution of this pain.  It is stayed under control.  He takes nonsteroidal anti-inflammatory drugs periodically to help with this.  He also is using approximately half tablet of hydrocodone per day on average to keep the pain under control.  He occasionally uses an extra half tablet at nighttime if he has been active or the pain is more intense.  No side effects are reported with the medications.  His lower extremity strength function and bowel bladder function have been stable.  He continues to follow-up with his primary care physicians for his baseline medical care he reports.  Outpatient Medications Prior to Visit  Medication Sig Dispense Refill  . atorvastatin (LIPITOR) 20 MG tablet Take 20 mg by mouth daily.    [name] b complex vitamins tablet Take 1 tablet by mouth daily.     . benazepril (LOTENSIN) 20 MG tablet Take 1 tablet (20 mg total) by mouth daily. 90 tablet 0  . Cholecalciferol (VITAMIN D3) 25 MCG (1000 UT) CAPS Take 1 capsule by mouth.    . Cyanocobalamin (B-12 PO) Take 1 Dose by mouth daily.    [name] dutasteride (AVODART) 0.5 MG capsule Take 0.5 mg by mouth daily.    . Lycopene 15 MG CAPS Take by mouth.    . saw palmetto 160 MG capsule Take 160 mg by mouth 2 (two) times daily.    . vitamin C (ASCORBIC ACID) 500 MG tablet Take 500 mg by mouth daily.    . Zinc 50 MG TABS Take by mouth.    [name] HYDROcodone-acetaminophen (NORCO) 7.5-325 MG tablet Take 1 tablet by mouth daily.    [name] atorvastatin (LIPITOR) 40 MG tablet  Take 1 tablet (40 mg total) by mouth at bedtime. (Patient not taking: Reported on 08/12/2019) 90 tablet 0  . Capsaicin-Menthol (CAPZASIN QUICK RELIEF) 0.025-10 % GEL Apply topically.    . gabapentin (NEURONTIN) 400 MG capsule Take 1 capsule (400 mg total) by mouth 2 (two) times daily for 30 days. 60 capsule 1  . Omega-3 Fatty Acids (OMEGA-3 PO) Take 788 mg by mouth daily.     No facility-administered medications prior to visit.    Review of Systems CNS: No confusion or sedation Cardiac: No angina or palpitations GI: No abdominal pain or constipation Constitutional: No nausea vomiting fevers or chills  Objective:  BP 128/75   Pulse (!) 57   Temp (!) 97 F (36.1 C)   Resp 18   Ht 5\' 8"  (1.727 m)   Wt 160 lb (72.6 kg)   SpO2 95%   BMI 24.33 kg/m    BP Readings from Last 3 Encounters:  08/12/19 128/75  08/06/18 133/78  03/18/18 139/75     Wt Readings from Last 3 Encounters:  08/12/19 160 lb (72.6 kg)  03/18/18 164 lb (74.4 kg)  02/05/18 163 lb 14.4 oz (74.3 kg)     Physical Exam Pt is alert and oriented PERRL EOMI HEART IS RRR no murmur or rub LCTA no  wheezing or rales MUSCULOSKELETAL reveals some mild allodynia over the right T8 T10 intercostal space mid clavicular and mid axillary line.  Labs  No results found for: HGBA1C Lab Results  Component Value Date   LDLCALC 101 (H) 02/05/2018   CREATININE 1.10 04/07/2019    -------------------------------------------------------------------------------------------------------------------- Lab Results  Component Value Date   WBC 6.3 02/05/2018   HGB 15.7 02/05/2018   HCT 47.6 02/05/2018   PLT 188 02/05/2018   GLUCOSE 83 08/12/2018   CHOL 179 08/12/2018   TRIG 110 08/12/2018   HDL 49 02/05/2018   LDLCALC 101 (H) 02/05/2018   ALT 27 08/12/2018   AST 41 (H) 08/12/2018   NA 143 08/12/2018   K 5.0 08/12/2018   CL 107 (H) 08/12/2018   CREATININE 1.10 04/07/2019   BUN 26 08/12/2018   CO2 19 (L) 08/12/2018   TSH  2.190 02/05/2018   INR 0.95 11/28/2016    --------------------------------------------------------------------------------------------------------------------- MR PROSTATE W WO CONTRAST  Result Date: 04/08/2019 CLINICAL DATA:  Elevated PSA.  No prior biopsy.  No symptoms. EXAM: MR PROSTATE WITHOUT AND WITH CONTRAST TECHNIQUE: Multiplanar multisequence MRI images were obtained of the pelvis centered about the prostate. Pre and post contrast images were obtained. CONTRAST:  59mL GADAVIST GADOBUTROL 1 MMOL/ML IV SOLN COMPARISON:  03/29/2017 abdominopelvic CT. FINDINGS: Prostate: Demonstrates mild central gland enlargement and heterogeneity, consistent with benign prostatic hyperplasia. No dominant central gland nodule. No areas of masslike peripheral zone T2 hypointensity, restricted diffusion, or early post-contrast enhancement. Volume: 5.3 x 4.0 x 4.8 cm (volume = 53 cm^3) Transcapsular spread:  Absent Seminal vesicle involvement: Absent Neurovascular bundle involvement: Absent Pelvic adenopathy: Absent Bone metastasis: Absent Other findings: No significant free fluid. Normal urinary bladder. Fat containing left inguinal hernia. IMPRESSION: No findings to suggest macroscopic or high-grade prostate carcinoma. Electronically Signed   By: Timothy Knapp M.D.   On: 04/08/2019 09:01     Assessment & Plan:   Timothy Knapp was seen today for back pain.  Diagnoses and all orders for this visit:  Chronic right-sided thoracic back pain  DDD (degenerative disc disease), thoracic  Chronic pain syndrome  Chronic, continuous use of opioids  Postherpetic neuralgia  Other orders -     HYDROcodone-acetaminophen (NORCO) 7.5-325 MG tablet; Take 1 tablet by mouth 2 (two) times daily as needed for moderate pain.        ----------------------------------------------------------------------------------------------------------------------  Problem List Items Addressed This Visit      Unprioritized   Back pain -  Primary   Relevant Medications   HYDROcodone-acetaminophen (NORCO) 7.5-325 MG tablet   DDD (degenerative disc disease), thoracic   Relevant Medications   HYDROcodone-acetaminophen (NORCO) 7.5-325 MG tablet   Postherpetic neuralgia    Other Visit Diagnoses    Chronic pain syndrome       Relevant Medications   HYDROcodone-acetaminophen (NORCO) 7.5-325 MG tablet   Chronic, continuous use of opioids            ----------------------------------------------------------------------------------------------------------------------  1. Chronic right-sided thoracic back pain We will continue with his current pain management protocol.  We will refill his opioid medications today.  He has been very conservative and has done a great job in limiting his dosing.  He is also taking anti-inflammatories as needed we did talk about turmeric as a possible option as well.  I want him to continue follow-up with his primary care physicians for his baseline medical care and we will schedule him for return to clinic in 3 months.  2. DDD (degenerative disc disease),  thoracic Continue core stretching strengthening exercises and pickleball as he is currently doing.  3. Chronic pain syndrome I have reviewed the [facility] practitioner database information and it is appropriate for refills and this will be done for today's date.  4. Chronic, continuous use of opioids As above  5. Postherpetic neuralgia As above    ----------------------------------------------------------------------------------------------------------------------  I have changed Timothy Kalata "Ed"'s HYDROcodone-acetaminophen. I am also having him maintain his b complex vitamins, Capsaicin-Menthol, Vitamin D3, Cyanocobalamin (B-12 PO), vitamin C, Omega-3 Fatty Acids (OMEGA-3 PO), gabapentin, atorvastatin, benazepril, dutasteride, saw palmetto, Zinc, Lycopene, and atorvastatin.   Meds ordered this encounter  Medications  .  HYDROcodone-acetaminophen (NORCO) 7.5-325 MG tablet    Sig: Take 1 tablet by mouth 2 (two) times daily as needed for moderate pain.    Dispense:  60 tablet    Refill:  0   Patient's Medications  New Prescriptions   No medications on file  Previous Medications   ATORVASTATIN (LIPITOR) 20 MG TABLET    Take 20 mg by mouth daily.   ATORVASTATIN (LIPITOR) 40 MG TABLET    Take 1 tablet (40 mg total) by mouth at bedtime.   B COMPLEX VITAMINS TABLET    Take 1 tablet by mouth daily.    BENAZEPRIL (LOTENSIN) 20 MG TABLET    Take 1 tablet (20 mg total) by mouth daily.   CAPSAICIN-MENTHOL (CAPZASIN QUICK RELIEF) 0.025-10 % GEL    Apply topically.   CHOLECALCIFEROL (VITAMIN D3) 25 MCG (1000 UT) CAPS    Take 1 capsule by mouth.   CYANOCOBALAMIN (B-12 PO)    Take 1 Dose by mouth daily.   DUTASTERIDE (AVODART) 0.5 MG CAPSULE    Take 0.5 mg by mouth daily.   GABAPENTIN (NEURONTIN) 400 MG CAPSULE    Take 1 capsule (400 mg total) by mouth 2 (two) times daily for 30 days.   LYCOPENE 15 MG CAPS    Take by mouth.   OMEGA-3 FATTY ACIDS (OMEGA-3 PO)    Take 788 mg by mouth daily.   SAW PALMETTO 160 MG CAPSULE    Take 160 mg by mouth 2 (two) times daily.   VITAMIN C (ASCORBIC ACID) 500 MG TABLET    Take 500 mg by mouth daily.   ZINC 50 MG TABS    Take by mouth.  Modified Medications   Modified Medication Previous Medication   HYDROCODONE-ACETAMINOPHEN (NORCO) 7.5-325 MG TABLET HYDROcodone-acetaminophen (NORCO) 7.5-325 MG tablet      Take 1 tablet by mouth 2 (two) times daily as needed for moderate pain.    Take 1 tablet by mouth daily.  Discontinued Medications   No medications on file   ----------------------------------------------------------------------------------------------------------------------  Follow-up: Return in about 3 months (around 11/12/2019) for evaluation, med refill.    [name], MD

## 2019-08-15 ENCOUNTER — Other Ambulatory Visit: Payer: Self-pay | Admitting: Nurse Practitioner

## 2019-08-15 DIAGNOSIS — I10 Essential (primary) hypertension: Secondary | ICD-10-CM

## 2019-08-15 NOTE — Telephone Encounter (Signed)
Requested Prescriptions  Pending Prescriptions Disp Refills   benazepril (LOTENSIN) 20 MG tablet [Pharmacy Med Name: BENAZEPRIL 20MG  TABLETS] 30 tablet 0    Sig: TAKE 1 TABLET(20 MG) BY MOUTH DAILY     Cardiovascular:  ACE Inhibitors Failed - 08/15/2019 10:29 AM      Failed - K in normal range and within 180 days    Potassium  Date Value Ref Range Status  08/12/2018 5.0 3.5 - 5.2 mmol/L Final    Comment:    Specimen received hemolyzed. Clinical correlation indicated.  04/25/2013 4.5 3.5 - 5.1 mmol/L Final         Failed - Valid encounter within last 6 months    Recent Outpatient Visits          9 months ago Essential hypertension   [facility], Jeannette How, MD   1 year ago Status post total right knee replacement   [facility], Jeannette How, MD   1 year ago Essential hypertension   [place], Jeannette How, MD   1 year ago Arthralgia of right temporomandibular joint   [facility] Volney American, [place]   2 years ago Benign prostatic hyperplasia with weak urinary stream   [facility] Guadalupe Maple, MD             Passed - Cr in normal range and within 180 days    Creatinine  Date Value Ref Range Status  04/25/2013 0.99 0.60 - 1.30 mg/dL Final   Creatinine, Ser  Date Value Ref Range Status  04/07/2019 1.10 0.61 - 1.24 mg/dL Final         Passed - Patient is not pregnant      Passed - Last BP in normal range    BP Readings from Last 1 Encounters:  08/12/19 128/75

## 2019-08-25 DIAGNOSIS — E785 Hyperlipidemia, unspecified: Secondary | ICD-10-CM | POA: Diagnosis not present

## 2019-08-25 DIAGNOSIS — I10 Essential (primary) hypertension: Secondary | ICD-10-CM | POA: Diagnosis not present

## 2019-08-25 DIAGNOSIS — R972 Elevated prostate specific antigen [PSA]: Secondary | ICD-10-CM | POA: Diagnosis not present

## 2019-09-11 ENCOUNTER — Other Ambulatory Visit: Payer: Self-pay | Admitting: Nurse Practitioner

## 2019-09-11 DIAGNOSIS — I10 Essential (primary) hypertension: Secondary | ICD-10-CM

## 2019-09-15 DIAGNOSIS — H2513 Age-related nuclear cataract, bilateral: Secondary | ICD-10-CM | POA: Diagnosis not present

## 2019-09-29 DIAGNOSIS — M25561 Pain in right knee: Secondary | ICD-10-CM | POA: Diagnosis not present

## 2019-09-29 DIAGNOSIS — M1712 Unilateral primary osteoarthritis, left knee: Secondary | ICD-10-CM | POA: Diagnosis not present

## 2019-09-29 DIAGNOSIS — Z471 Aftercare following joint replacement surgery: Secondary | ICD-10-CM | POA: Diagnosis not present

## 2019-09-29 DIAGNOSIS — G8929 Other chronic pain: Secondary | ICD-10-CM | POA: Diagnosis not present

## 2019-09-29 DIAGNOSIS — Z96651 Presence of right artificial knee joint: Secondary | ICD-10-CM | POA: Diagnosis not present

## 2019-09-29 DIAGNOSIS — M25562 Pain in left knee: Secondary | ICD-10-CM | POA: Diagnosis not present

## 2019-10-23 ENCOUNTER — Other Ambulatory Visit: Payer: Self-pay

## 2019-10-23 ENCOUNTER — Ambulatory Visit: Payer: PPO | Attending: Internal Medicine | Admitting: Anesthesiology

## 2019-10-23 DIAGNOSIS — R972 Elevated prostate specific antigen [PSA]: Secondary | ICD-10-CM | POA: Diagnosis not present

## 2019-10-23 DIAGNOSIS — M5134 Other intervertebral disc degeneration, thoracic region: Secondary | ICD-10-CM | POA: Diagnosis not present

## 2019-10-23 DIAGNOSIS — G894 Chronic pain syndrome: Secondary | ICD-10-CM | POA: Diagnosis not present

## 2019-10-23 DIAGNOSIS — B0229 Other postherpetic nervous system involvement: Secondary | ICD-10-CM

## 2019-10-23 DIAGNOSIS — F1190 Opioid use, unspecified, uncomplicated: Secondary | ICD-10-CM

## 2019-10-23 DIAGNOSIS — M546 Pain in thoracic spine: Secondary | ICD-10-CM | POA: Diagnosis not present

## 2019-10-23 DIAGNOSIS — G8929 Other chronic pain: Secondary | ICD-10-CM | POA: Diagnosis not present

## 2019-10-23 DIAGNOSIS — N5201 Erectile dysfunction due to arterial insufficiency: Secondary | ICD-10-CM | POA: Diagnosis not present

## 2019-10-23 DIAGNOSIS — N401 Enlarged prostate with lower urinary tract symptoms: Secondary | ICD-10-CM | POA: Diagnosis not present

## 2019-10-23 DIAGNOSIS — K649 Unspecified hemorrhoids: Secondary | ICD-10-CM | POA: Diagnosis not present

## 2019-10-23 MED ORDER — HYDROCODONE-ACETAMINOPHEN 7.5-325 MG PO TABS: 1.0000 | ORAL_TABLET | Freq: Every day | ORAL | 0 refills | Status: DC | PRN

## 2019-10-23 MED ORDER — MELOXICAM 7.5 MG PO TABS: 7.5000 mg | ORAL_TABLET | Freq: Every day | ORAL | 5 refills | Status: AC

## 2019-10-23 NOTE — Progress Notes (Signed)
Virtual Visit via Telephone Note  I connected with Timothy Knapp on 10/23/19 at  1:50 PM EDT by telephone and verified that I am speaking with the correct person using two identifiers.  Location: Patient: Home Provider: Pain control center   I discussed the limitations, risks, security and privacy concerns of performing an evaluation and management service by telephone and the availability of in person appointments. I also discussed with the patient that there may be a patient responsible charge related to this service. The patient expressed understanding and agreed to proceed.   History of Present Illness: I spoke with Timothy Knapp today via telephone.  We were unable to link up for the video portion of the virtual conference but he reports that his pain is actually doing better.  He was recently started on Mobic 7.5 mg tablets for some of his diffuse pain and he reports that this has worked well.  He still occasionally uses hydrocodone for breakthrough pain when the pain is severe.  No changes in the quality characteristic or distribution of the pain are noted at this time.  He has tried for a considerable period of time to continue to wean down on his opioid use and is having success.  He is asking today whether he might continue with the Mobic on a once a day or twice a day dosing with having hydrocodone for breakthrough pain if indicated or necessary.  No side effects to the medications are noted in the quality characteristic and distribution of this pain is been stable in nature.  Is still try to do his exercises and activity to try and keep his pain under control   Observations/Objective: [name] Current Outpatient Medications:  .  atorvastatin (LIPITOR) 20 MG tablet, Take 20 mg by mouth daily., Disp: , Rfl:  .  atorvastatin (LIPITOR) 40 MG tablet, Take 1 tablet (40 mg total) by mouth at bedtime. (Patient not taking: Reported on 08/12/2019), Disp: 90 tablet, Rfl: 0 .  b complex vitamins tablet, Take 1  tablet by mouth daily. , Disp: , Rfl:  .  benazepril (LOTENSIN) 20 MG tablet, TAKE 1 TABLET(20 MG) BY MOUTH DAILY, Disp: 30 tablet, Rfl: 0 .  Capsaicin-Menthol (CAPZASIN QUICK RELIEF) 0.025-10 % GEL, Apply topically., Disp: , Rfl:  .  Cholecalciferol (VITAMIN D3) 25 MCG (1000 UT) CAPS, Take 1 capsule by mouth., Disp: , Rfl:  .  Cyanocobalamin (B-12 PO), Take 1 Dose by mouth daily., Disp: , Rfl:  .  dutasteride (AVODART) 0.5 MG capsule, Take 0.5 mg by mouth daily., Disp: , Rfl:  .  gabapentin (NEURONTIN) 400 MG capsule, Take 1 capsule (400 mg total) by mouth 2 (two) times daily for 30 days., Disp: 60 capsule, Rfl: 1 .  HYDROcodone-acetaminophen (NORCO) 7.5-325 MG tablet, Take 1 tablet by mouth daily as needed for moderate pain or severe pain., Disp: 30 tablet, Rfl: 0 .  Lycopene 15 MG CAPS, Take by mouth., Disp: , Rfl:  .  meloxicam (MOBIC) 7.5 MG tablet, Take 1 tablet (7.5 mg total) by mouth daily., Disp: 60 tablet, Rfl: 5 .  Omega-3 Fatty Acids (OMEGA-3 PO), Take 788 mg by mouth daily., Disp: , Rfl:  .  saw palmetto 160 MG capsule, Take 160 mg by mouth 2 (two) times daily., Disp: , Rfl:  .  vitamin C (ASCORBIC ACID) 500 MG tablet, Take 500 mg by mouth daily., Disp: , Rfl:  .  Zinc 50 MG TABS, Take by mouth., Disp: , Rfl:   Assessment and  Plan: 1. Chronic right-sided thoracic back pain   2. DDD (degenerative disc disease), thoracic   3. Chronic pain syndrome   4. Chronic, continuous use of opioids   5. Postherpetic neuralgia   Based on our discussion today I feel that this is appropriate change in therapy over to the Mobic 7.5 mg tablets to be taken twice a day.  I would like him to have the hydrocodone for breakthrough pain if needed.  We talked about ways to manage this.  He has had success in discontinuing the gabapentin though this could be added back in if necessary.  We will hold off on that.  I want to have him follow-up with [place] in 2 months for standard review and have encouraged him to  continue follow-up with his primary care physicians for his baseline medical care.  I have reviewed the [facility] practitioner database information and it is appropriate for refill.  This was made for today for hydrocodone 7.5 mg tablets once a day as needed if needed.  Follow Up Instructions:    I discussed the assessment and treatment plan with the patient. The patient was provided an opportunity to ask questions and all were answered. The patient agreed with the plan and demonstrated an understanding of the instructions.   The patient was advised to call back or seek an in-person evaluation if the symptoms worsen or if the condition fails to improve as anticipated.  I provided 30 minutes of non-face-to-face time during this encounter.   [name], MD

## 2019-10-27 ENCOUNTER — Encounter: Payer: Self-pay | Admitting: Anesthesiology

## 2019-10-28 MED ORDER — GABAPENTIN 400 MG PO CAPS
400.0000 mg | ORAL_CAPSULE | Freq: Two times a day (BID) | ORAL | 3 refills | Status: DC
Start: 2019-10-28 — End: 2020-01-21

## 2019-10-28 MED ORDER — HYDROCODONE-ACETAMINOPHEN 7.5-325 MG PO TABS
1.0000 | ORAL_TABLET | Freq: Every day | ORAL | 0 refills | Status: DC | PRN
Start: 2019-10-28 — End: 2019-12-14

## 2019-10-28 NOTE — Addendum Note (Signed)
Addended by: [name] on: 10/28/2019 03:02 PM   Modules accepted: Orders

## 2019-10-29 ENCOUNTER — Encounter: Payer: Self-pay | Admitting: *Deleted

## 2019-12-10 DIAGNOSIS — Z96651 Presence of right artificial knee joint: Secondary | ICD-10-CM | POA: Diagnosis not present

## 2019-12-11 ENCOUNTER — Encounter: Payer: Self-pay | Admitting: Anesthesiology

## 2019-12-14 MED ORDER — HYDROCODONE-ACETAMINOPHEN 7.5-325 MG PO TABS
1.0000 | ORAL_TABLET | Freq: Every day | ORAL | 0 refills | Status: AC | PRN
Start: 2019-12-14 — End: 2020-01-13

## 2019-12-14 NOTE — Addendum Note (Signed)
Addended by: [name] on: 12/14/2019 02:03 PM   Modules accepted: Orders

## 2020-01-11 ENCOUNTER — Other Ambulatory Visit: Payer: Self-pay | Admitting: Anesthesiology

## 2020-01-19 ENCOUNTER — Telehealth: Payer: Self-pay

## 2020-01-19 NOTE — Telephone Encounter (Signed)
Returned patients call to clarify appt details.  I spoke with patients wife.  Informed her that her husbands appt will be by phone not in person.  Thanks,  [place], [place]

## 2020-01-21 ENCOUNTER — Other Ambulatory Visit: Payer: Self-pay

## 2020-01-21 ENCOUNTER — Telehealth (INDEPENDENT_AMBULATORY_CARE_PROVIDER_SITE_OTHER): Payer: Self-pay | Admitting: Gastroenterology

## 2020-01-21 DIAGNOSIS — Z86010 Personal history of colonic polyps: Secondary | ICD-10-CM

## 2020-01-21 MED ORDER — NA SULFATE-K SULFATE-MG SULF 17.5-3.13-1.6 GM/177ML PO SOLN: 1.0000 | Freq: Once | ORAL | 0 refills | Status: AC

## 2020-01-21 NOTE — Progress Notes (Signed)
Gastroenterology Pre-Procedure Review  Request Date: 02/19/20 Requesting Physician: Dr. Bonna Gains  PATIENT REVIEW QUESTIONS: The patient responded to the following health history questions as indicated:    1. Are you having any GI issues? no 2. Do you have a personal history of Polyps? yes (03/20/17 performed by Dr. Bonna Gains) 3. Do you have a family history of Colon Cancer or Polyps? no 4. Diabetes Mellitus? no 5. Joint replacements in the past 12 months?no 6. Major health problems in the past 3 months?no 7. Any artificial heart valves, MVP, or defibrillator?no    MEDICATIONS & ALLERGIES:    Patient reports the following regarding taking any anticoagulation/antiplatelet therapy:   Plavix, Coumadin, Eliquis, Xarelto, Lovenox, Pradaxa, Brilinta, or Effient? no Aspirin? no  Patient confirms/reports the following medications:  Current Outpatient Medications  Medication Sig Dispense Refill  . atorvastatin (LIPITOR) 20 MG tablet Take 20 mg by mouth daily.    . benazepril (LOTENSIN) 20 MG tablet TAKE 1 TABLET(20 MG) BY MOUTH DAILY 30 tablet 0  . Cholecalciferol (VITAMIN D3 PO) Take by mouth.    . Cyanocobalamin (B-12 PO) Take 1 Dose by mouth daily.    [name] dutasteride (AVODART) 0.5 MG capsule Take 0.5 mg by mouth daily.    . meloxicam (MOBIC) 7.5 MG tablet TAKE 1 TABLET(7.5 MG) BY MOUTH EVERY DAY    . Omega-3 Fatty Acids (OMEGA-3 PO) Take 788 mg by mouth daily.    . vitamin C (ASCORBIC ACID) 500 MG tablet Take 500 mg by mouth daily.    [name] zinc gluconate 50 MG tablet Take 1 tablet by mouth daily.     No current facility-administered medications for this visit.    Patient confirms/reports the following allergies:  No Known Allergies  Orders Placed This Encounter  Procedures  . Procedural/ Surgical Case Request: COLONOSCOPY WITH PROPOFOL    Standing Status:   Standing    Number of Occurrences:   1    Order Specific Question:   Pre-op diagnosis    Answer:   history of colon polyps     Order Specific Question:   CPT Code    Answer:   13244    AUTHORIZATION INFORMATION Primary Insurance: 1D#: Group #:  Secondary Insurance: 1D#: Group #:  SCHEDULE INFORMATION: Date: 02/19/20 Time: Location: [place]

## 2020-01-22 ENCOUNTER — Encounter: Payer: Self-pay | Admitting: Anesthesiology

## 2020-01-27 ENCOUNTER — Other Ambulatory Visit: Payer: Self-pay

## 2020-01-27 ENCOUNTER — Encounter: Payer: Self-pay | Admitting: Anesthesiology

## 2020-01-27 ENCOUNTER — Ambulatory Visit: Payer: PPO | Attending: Anesthesiology | Admitting: Anesthesiology

## 2020-01-27 DIAGNOSIS — G8929 Other chronic pain: Secondary | ICD-10-CM | POA: Diagnosis not present

## 2020-01-27 DIAGNOSIS — G894 Chronic pain syndrome: Secondary | ICD-10-CM

## 2020-01-27 DIAGNOSIS — M5134 Other intervertebral disc degeneration, thoracic region: Secondary | ICD-10-CM | POA: Diagnosis not present

## 2020-01-27 DIAGNOSIS — M546 Pain in thoracic spine: Secondary | ICD-10-CM

## 2020-01-27 DIAGNOSIS — B0229 Other postherpetic nervous system involvement: Secondary | ICD-10-CM

## 2020-01-27 DIAGNOSIS — F1190 Opioid use, unspecified, uncomplicated: Secondary | ICD-10-CM

## 2020-01-27 MED ORDER — GABAPENTIN 100 MG PO CAPS: 100.0000 mg | ORAL_CAPSULE | Freq: Three times a day (TID) | ORAL | 0 refills | Status: DC

## 2020-01-27 MED ORDER — HYDROCODONE-ACETAMINOPHEN 7.5-325 MG PO TABS: 1.0000 | ORAL_TABLET | Freq: Every day | ORAL | 0 refills | Status: DC | PRN

## 2020-01-27 NOTE — Progress Notes (Signed)
Virtual Visit via Telephone Note  I connected with Timothy Knapp on 01/27/20 at  2:00 PM EST by telephone and verified that I am speaking with the correct person using two identifiers.  Location: Patient: Home Provider: Pain control center   I discussed the limitations, risks, security and privacy concerns of performing an evaluation and management service by telephone and the availability of in person appointments. I also discussed with the patient that there may be a patient responsible charge related to this service. The patient expressed understanding and agreed to proceed.   History of Present Illness: I spoke with Timothy Knapp via telephone today as we were unable to link up for the video portion of the virtual conference.  He still having some pain of a similar nature to what he has previously experienced.  He takes his 7.5 mg hydrocodone approximately every day to every other day.  He uses this very sparingly and effectively.  He has expressed interest in initiating gabapentin as we have previously discussed.  He denies any side effects with gabapentin or the opioids and states that he has gotten good relief from the opioids in the past.  No side effects are reported.  He continues to gain good relief with the medication and derives good functional benefit with no side effects reported.   Observations/Objective:  Current Outpatient Medications:  .  gabapentin (NEURONTIN) 100 MG capsule, Take 1 capsule (100 mg total) by mouth 3 (three) times daily. 1 tablet at bedtime for 1 week, then increase to 2 tablets at bedtime for 1 week, and then add an additional tablet in the morning for a total of 300 mg/day as a maximum dose., Disp: 90 capsule, Rfl: 0 .  HYDROcodone-acetaminophen (NORCO) 7.5-325 MG tablet, Take 1 tablet by mouth daily as needed for moderate pain or severe pain., Disp: 30 tablet, Rfl: 0 .  atorvastatin (LIPITOR) 20 MG tablet, Take 20 mg by mouth daily., Disp: , Rfl:  .  benazepril  (LOTENSIN) 20 MG tablet, TAKE 1 TABLET(20 MG) BY MOUTH DAILY, Disp: 30 tablet, Rfl: 0 .  Cholecalciferol (VITAMIN D3 PO), Take by mouth., Disp: , Rfl:  .  Cyanocobalamin (B-12 PO), Take 1 Dose by mouth daily., Disp: , Rfl:  .  dutasteride (AVODART) 0.5 MG capsule, Take 0.5 mg by mouth daily., Disp: , Rfl:  .  meloxicam (MOBIC) 7.5 MG tablet, TAKE 1 TABLET(7.5 MG) BY MOUTH EVERY DAY, Disp: , Rfl:  .  Omega-3 Fatty Acids (OMEGA-3 PO), Take 788 mg by mouth daily., Disp: , Rfl:  .  vitamin C (ASCORBIC ACID) 500 MG tablet, Take 500 mg by mouth daily., Disp: , Rfl:  .  zinc gluconate 50 MG tablet, Take 1 tablet by mouth daily., Disp: , Rfl:   Assessment and Plan: 1. Chronic right-sided thoracic back pain   2. DDD (degenerative disc disease), thoracic   3. Chronic pain syndrome   4. Chronic, continuous use of opioids   5. Postherpetic neuralgia   Based on our discussion today and upon review of the [facility] practitioner database information it is appropriate for refill and this will be done for today's date.  This will be for the 7.5 mg hydrocodone dose.  I also talked to him about starting at gabapentin 100 mg at bedtime.  He can do this for 7 days and increase to 2 at bedtime for 7 days.  After 2 weeks he can add 1 additional tablet in the morning for total dose of 300 mg/day.  We will schedule a 1 month return visit and he is encouraged to continue follow-up with his primary care physicians.  No other changes will be initiated in his pain management protocol today.  Follow Up Instructions:    I discussed the assessment and treatment plan with the patient. The patient was provided an opportunity to ask questions and all were answered. The patient agreed with the plan and demonstrated an understanding of the instructions.   The patient was advised to call back or seek an in-person evaluation if the symptoms worsen or if the condition fails to improve as anticipated.  I provided 25 minutes of  non-face-to-face time during this encounter.   [name], MD

## 2020-02-17 ENCOUNTER — Other Ambulatory Visit
Admission: RE | Admit: 2020-02-17 | Discharge: 2020-02-17 | Disposition: A | Discharge status: Home / Self Care | Payer: PPO | Source: Ambulatory Visit | Attending: Gastroenterology | Admitting: Gastroenterology

## 2020-02-17 ENCOUNTER — Other Ambulatory Visit: Payer: Self-pay

## 2020-02-17 DIAGNOSIS — Z20822 Contact with and (suspected) exposure to covid-19: Secondary | ICD-10-CM | POA: Diagnosis not present

## 2020-02-17 DIAGNOSIS — Z01812 Encounter for preprocedural laboratory examination: Secondary | ICD-10-CM | POA: Insufficient documentation

## 2020-02-17 LAB — SARS CORONAVIRUS 2 (TAT 6-24 HRS): SARS Coronavirus 2: NEGATIVE

## 2020-02-18 DIAGNOSIS — E785 Hyperlipidemia, unspecified: Secondary | ICD-10-CM | POA: Diagnosis not present

## 2020-02-18 DIAGNOSIS — R972 Elevated prostate specific antigen [PSA]: Secondary | ICD-10-CM | POA: Diagnosis not present

## 2020-02-18 DIAGNOSIS — I10 Essential (primary) hypertension: Secondary | ICD-10-CM | POA: Diagnosis not present

## 2020-02-19 ENCOUNTER — Ambulatory Visit: Payer: PPO | Admitting: Registered Nurse

## 2020-02-19 ENCOUNTER — Encounter
Admission: RE | Disposition: A | Discharge status: Home / Self Care | Payer: Self-pay | Source: Home / Self Care | Attending: Gastroenterology

## 2020-02-19 ENCOUNTER — Encounter: Payer: Self-pay | Admitting: Gastroenterology

## 2020-02-19 ENCOUNTER — Ambulatory Visit
Admission: RE | Admit: 2020-02-19 | Discharge: 2020-02-19 | Disposition: A | Discharge status: Home / Self Care | Payer: PPO | Attending: Gastroenterology | Admitting: Gastroenterology

## 2020-02-19 ENCOUNTER — Other Ambulatory Visit: Payer: Self-pay

## 2020-02-19 DIAGNOSIS — Z1211 Encounter for screening for malignant neoplasm of colon: Secondary | ICD-10-CM | POA: Diagnosis not present

## 2020-02-19 DIAGNOSIS — Z791 Long term (current) use of non-steroidal anti-inflammatories (NSAID): Secondary | ICD-10-CM | POA: Diagnosis not present

## 2020-02-19 DIAGNOSIS — K5730 Diverticulosis of large intestine without perforation or abscess without bleeding: Secondary | ICD-10-CM | POA: Insufficient documentation

## 2020-02-19 DIAGNOSIS — D120 Benign neoplasm of cecum: Secondary | ICD-10-CM | POA: Diagnosis not present

## 2020-02-19 DIAGNOSIS — I10 Essential (primary) hypertension: Secondary | ICD-10-CM | POA: Diagnosis not present

## 2020-02-19 DIAGNOSIS — Z860100 Personal history of colon polyps, unspecified: Secondary | ICD-10-CM

## 2020-02-19 DIAGNOSIS — Z79899 Other long term (current) drug therapy: Secondary | ICD-10-CM | POA: Insufficient documentation

## 2020-02-19 DIAGNOSIS — K635 Polyp of colon: Secondary | ICD-10-CM

## 2020-02-19 DIAGNOSIS — K5790 Diverticulosis of intestine, part unspecified, without perforation or abscess without bleeding: Secondary | ICD-10-CM | POA: Diagnosis not present

## 2020-02-19 HISTORY — PX: COLONOSCOPY WITH PROPOFOL: SHX5780

## 2020-02-19 SURGERY — COLONOSCOPY WITH PROPOFOL
Anesthesia: General

## 2020-02-19 MED ORDER — EPHEDRINE 5 MG/ML INJ
INTRAVENOUS | Status: AC
  Filled 2020-02-19: qty 10

## 2020-02-19 MED ORDER — LIDOCAINE HCL (PF) 2 % IJ SOLN
INTRAMUSCULAR | Status: AC
  Filled 2020-02-19: qty 5

## 2020-02-19 MED ORDER — PROPOFOL 10 MG/ML IV BOLUS
INTRAVENOUS | Status: DC | PRN
  Administered 2020-02-19: 70 mg via INTRAVENOUS
  Administered 2020-02-19: 10 mg via INTRAVENOUS

## 2020-02-19 MED ORDER — LIDOCAINE HCL (CARDIAC) PF 100 MG/5ML IV SOSY
PREFILLED_SYRINGE | INTRAVENOUS | Status: DC | PRN
  Administered 2020-02-19: 40 mg via INTRAVENOUS

## 2020-02-19 MED ORDER — PROPOFOL 500 MG/50ML IV EMUL
INTRAVENOUS | Status: AC
  Filled 2020-02-19: qty 50

## 2020-02-19 MED ORDER — SODIUM CHLORIDE 0.9 % IV SOLN: INTRAVENOUS | Status: DC

## 2020-02-19 MED ORDER — PROPOFOL 500 MG/50ML IV EMUL
INTRAVENOUS | Status: DC | PRN
  Administered 2020-02-19: 150 ug/kg/min via INTRAVENOUS

## 2020-02-19 MED ORDER — EPHEDRINE SULFATE 50 MG/ML IJ SOLN
INTRAMUSCULAR | Status: DC | PRN
  Administered 2020-02-19: 10 mg via INTRAVENOUS

## 2020-02-19 NOTE — Anesthesia Preprocedure Evaluation (Signed)
Anesthesia Evaluation  Patient identified by MRN, date of birth, ID band Patient awake    Reviewed: Allergy & Precautions, H&P , NPO status , Patient's Chart, lab work & pertinent test results  History of Anesthesia Complications Negative for: history of anesthetic complications  Airway Mallampati: II  TM Distance: >3 FB Neck ROM: limited    Dental  (+) Teeth Intact   Pulmonary neg pulmonary ROS, neg shortness of breath,    breath sounds clear to auscultation       Cardiovascular Exercise Tolerance: Good hypertension, (-) angina(-) Past MI and (-) DOE  Rhythm:Regular Rate:Normal - Systolic murmurs    Neuro/Psych  Neuromuscular disease negative psych ROS   GI/Hepatic negative GI ROS, Neg liver ROS, neg GERD  ,  Endo/Other  negative endocrine ROS  Renal/GU negative Renal ROS  negative genitourinary   Musculoskeletal  (+) Arthritis ,   Abdominal   Peds  Hematology negative hematology ROS (+)   Anesthesia Other Findings Past Medical History: No date: Arthritis No date: BPH (benign prostatic hypertrophy) No date: Hyperlipidemia No date: Hypertension  Past Surgical History: No date: JOINT REPLACEMENT 12/10/2016: KNEE ARTHROPLASTY; Right     Comment:  Procedure: COMPUTER ASSISTED TOTAL KNEE ARTHROPLASTY;                Surgeon: Dereck Leep, MD;  Location: [facility] ORS;                Service: Orthopedics;  Laterality: Right; No date: PROSTATE SURGERY; N/A 11/09/2015: SKIN CANCER EXCISION     Reproductive/Obstetrics negative OB ROS                             Anesthesia Physical  Anesthesia Plan  ASA: II  Anesthesia Plan: General   Post-op Pain Management:    Induction: Intravenous  PONV Risk Score and Plan: 2 and Propofol infusion and TIVA  Airway Management Planned: Natural Airway and Nasal Cannula  Additional Equipment: None  Intra-op Plan:   Post-operative Plan:    Informed Consent: I have reviewed the patients History and Physical, chart, labs and discussed the procedure including the risks, benefits and alternatives for the proposed anesthesia with the patient or authorized representative who has indicated his/her understanding and acceptance.     Dental Advisory Given  Plan Discussed with: Anesthesiologist, CRNA and Surgeon  Anesthesia Plan Comments: (Patient consented for risks of anesthesia including but not limited to:  - adverse reactions to medications - risk of intubation if required - damage to teeth, lips or other oral mucosa - sore throat or hoarseness - Damage to heart, brain, lungs or loss of life  Patient voiced understanding.)        Anesthesia Quick Evaluation

## 2020-02-19 NOTE — Anesthesia Procedure Notes (Signed)
Date/Time: 02/19/2020 10:09 AM Performed by: Doreen Salvage, CRNA Pre-anesthesia Checklist: Patient identified, Emergency Drugs available, Suction available and Patient being monitored Patient Re-evaluated:Patient Re-evaluated prior to induction Oxygen Delivery Method: Nasal cannula Induction Type: IV induction Dental Injury: Teeth and Oropharynx as per pre-operative assessment  Comments: Nasal cannula with etCO2 monitoring

## 2020-02-19 NOTE — Transfer of Care (Signed)
Immediate Anesthesia Transfer of Care Note  Patient: Timothy Knapp  Procedure(s) Performed: Procedure(s): COLONOSCOPY WITH PROPOFOL (N/A)  Patient Location: PACU and Endoscopy Unit  Anesthesia Type:General  Level of Consciousness: sedated  Airway & Oxygen Therapy: Patient Spontanous Breathing and Patient connected to nasal cannula oxygen  Post-op Assessment: Report given to RN and Post -op Vital signs reviewed and stable  Post vital signs: Reviewed and stable  Last Vitals:  Vitals:   02/19/20 0903 02/19/20 1041  BP: 122/68 102/62  Pulse: 60 62  Resp: 20 13  Temp: (!) 36.4 C   SpO2: 09% 23%    Complications: No apparent anesthesia complications

## 2020-02-19 NOTE — Op Note (Signed)
[facility] Gastroenterology Patient Name: Timothy Knapp Procedure Date: 02/19/2020 9:59 AM MRN: [id number] Account #: [id number] Date of Birth: 22-Jul-1940 Admit Type: Outpatient Age: 80 Room: [facility] ENDO ROOM 2 Gender: Male Note Status: Finalized Procedure:             Colonoscopy Indications:           High risk colon cancer surveillance: Personal history                         of colonic polyps Providers:             Wess Baney B. Bonna Gains MD, MD Referring MD:          Ocie Cornfield. Ouida Sills MD, MD (Referring MD) Medicines:             Monitored Anesthesia Care Complications:         No immediate complications. Procedure:             Pre-Anesthesia Assessment:                        - ASA Grade Assessment: II - A patient with mild                         systemic disease.                        - Prior to the procedure, a History and Physical was                         performed, and patient medications, allergies and                         sensitivities were reviewed. The patient's tolerance                         of previous anesthesia was reviewed.                        - The risks and benefits of the procedure and the                         sedation options and risks were discussed with the                         patient. All questions were answered and informed                         consent was obtained.                        - Patient identification and proposed procedure were                         verified prior to the procedure by the physician, the                         nurse, the anesthesiologist, the anesthetist and the                         technician. The  procedure was verified in the                         procedure room.                        After obtaining informed consent, the colonoscope was                         passed under direct vision. Throughout the procedure,                         the patient's blood pressure, pulse,  and oxygen                         saturations were monitored continuously. The                         Colonoscope was introduced through the anus and                         advanced to the the cecum, identified by appendiceal                         orifice and ileocecal valve. The colonoscopy was                         performed with ease. The patient tolerated the                         procedure well. The quality of the bowel preparation                         was good. Findings:      The perianal and digital rectal examinations were normal.      A 2 mm polyp was found in the cecum. The polyp was sessile. The polyp       was removed with a jumbo cold forceps. Resection and retrieval were       complete.      Multiple diverticula were found in the sigmoid colon.      The exam was otherwise without abnormality.      The rectum, sigmoid colon, descending colon, transverse colon, ascending       colon and cecum appeared normal.      The retroflexed view of the distal rectum and anal verge was normal and       showed no anal or rectal abnormalities. Impression:            - One 2 mm polyp in the cecum, removed with a jumbo                         cold forceps. Resected and retrieved.                        - Diverticulosis in the sigmoid colon.                        - The examination was otherwise normal.                        -  The rectum, sigmoid colon, descending colon,                         transverse colon, ascending colon and cecum are normal.                        - The distal rectum and anal verge are normal on                         retroflexion view. Recommendation:        - Discharge patient to home (with escort).                        - Advance diet as tolerated.                        - Continue present medications.                        - Await pathology results.                        - Repeat colonoscopy date to be determined after                          pending pathology results are reviewed.                        - The findings and recommendations were discussed with                         the patient.                        - The findings and recommendations were discussed with                         the patient's family.                        - Return to primary care physician as previously                         scheduled.                        - High fiber diet. Procedure Code(s):     --- Professional ---                        [phone], Colonoscopy, flexible; with biopsy, single or                         multiple Diagnosis Code(s):     --- Professional ---                        Z86.010, Personal history of colonic polyps                        K63.5, Polyp of colon CPT copyright 2019 American Medical Association. All rights reserved. The codes documented in this report are preliminary and  upon coder review may  be revised to meet current compliance requirements.  Vonda Antigua, MD Margretta Sidle B. Bonna Gains MD, MD 02/19/2020 10:39:24 AM This report has been signed electronically. Number of Addenda: 0 Note Initiated On: 02/19/2020 9:59 AM Scope Withdrawal Time: 0 hours 16 minutes 46 seconds  Total Procedure Duration: 0 hours 23 minutes 55 seconds  Estimated Blood Loss:  Estimated blood loss: none.      [facility]

## 2020-02-19 NOTE — H&P (Signed)
Timothy Antigua, MD [address], [place], [place], [place], [address] [address], [place], [place], [place], [address] Phone: [phone]  Fax: [phone]  Primary Care Physician:  Timothy Ruths, MD   Pre-Procedure History & Physical: HPI:  Timothy Knapp is a 80 y.o. male is here for a colonoscopy.   Past Medical History:  Diagnosis Date  . Arthritis   . BPH (benign prostatic hypertrophy)   . Hyperlipidemia   . Hypertension     Past Surgical History:  Procedure Laterality Date  . COLONOSCOPY WITH PROPOFOL N/A 03/20/2017   Procedure: COLONOSCOPY WITH PROPOFOL;  Surgeon: Virgel Manifold, MD;  Location: [facility] ENDOSCOPY;  Service: Endoscopy;  Laterality: N/A;  . JOINT REPLACEMENT    . KNEE ARTHROPLASTY Right 12/10/2016   Procedure: COMPUTER ASSISTED TOTAL KNEE ARTHROPLASTY;  Surgeon: Dereck Leep, MD;  Location: [facility] ORS;  Service: Orthopedics;  Laterality: Right;  . PROSTATE SURGERY N/A   . SKIN CANCER EXCISION  11/09/2015    Prior to Admission medications   Medication Sig Start Date End Date Taking? Authorizing Provider  atorvastatin (LIPITOR) 20 MG tablet Take 20 mg by mouth daily.   Yes [provider]  benazepril (LOTENSIN) 20 MG tablet TAKE 1 TABLET(20 MG) BY MOUTH DAILY 08/15/19  Yes Cannady, Jolene T, NP  Cholecalciferol (VITAMIN D3 PO) Take by mouth.   Yes [provider]  Cyanocobalamin (B-12 PO) Take 1 Dose by mouth daily.   Yes [provider]  dutasteride (AVODART) 0.5 MG capsule Take 0.5 mg by mouth daily.   Yes [provider]  gabapentin (NEURONTIN) 100 MG capsule Take 1 capsule (100 mg total) by mouth 3 (three) times daily. 1 tablet at bedtime for 1 week, then increase to 2 tablets at bedtime for 1 week, and then add an additional tablet in the morning for a total of 300 mg/day as a maximum dose. 01/27/20 02/26/20 Yes [name], MD  HYDROcodone-acetaminophen (NORCO) 7.5-325 MG tablet Take 1 tablet by  mouth daily as needed for moderate pain or severe pain. 01/27/20 02/26/20 Yes [name], MD  meloxicam (MOBIC) 7.5 MG tablet TAKE 1 TABLET(7.5 MG) BY MOUTH EVERY DAY 01/19/20  Yes [provider]  Omega-3 Fatty Acids (OMEGA-3 PO) Take 788 mg by mouth daily.   Yes [provider]  vitamin C (ASCORBIC ACID) 500 MG tablet Take 500 mg by mouth daily.   Yes [provider]  zinc gluconate 50 MG tablet Take 1 tablet by mouth daily.   Yes [provider]    Allergies as of 01/21/2020  . (No Known Allergies)    Family History  Problem Relation Age of Onset  . Heart disease Mother   . Prostate cancer Neg Hx   . Bladder Cancer Neg Hx   . Kidney cancer Neg Hx     Social History   Socioeconomic History  . Marital status: Married    Spouse name: Not on file  . Number of children: Not on file  . Years of education: Not on file  . Highest education level: Not on file  Occupational History  . Not on file  Tobacco Use  . Smoking status: Never Smoker  . Smokeless tobacco: Never Used  Vaping Use  . Vaping Use: Never used  Substance and Sexual Activity  . Alcohol use: No    Alcohol/week: 0.0 standard drinks  . Drug use: No  . Sexual activity: Not on file  Other Topics Concern  .  Not on file  Social History Narrative  . Not on file   Social Determinants of Health   Financial Resource Strain: Not on file  Food Insecurity: Not on file  Transportation Needs: Not on file  Physical Activity: Not on file  Stress: Not on file  Social Connections: Not on file  Intimate Partner Violence: Not on file    Review of Systems: See HPI, otherwise negative ROS  Physical Exam: BP 122/68   Pulse 60   Temp (!) 97.5 F (36.4 C) (Temporal)   Resp 20   Ht 5\' 8"  (1.727 m)   Wt 72.6 kg   SpO2 96%   BMI 24.33 kg/m  General:   Alert,  pleasant and cooperative in NAD Head:  Normocephalic and atraumatic. Neck:  Supple; no masses or thyromegaly. Lungs:   Clear throughout to auscultation, normal respiratory effort.    Heart:  +S1, +S2, Regular rate and rhythm, No edema. Abdomen:  Soft, nontender and nondistended. Normal bowel sounds, without guarding, and without rebound.   Neurologic:  Alert and  oriented x4;  grossly normal neurologically.  Impression/Plan: Timothy Knapp is here for a colonoscopy to be performed for history of adenoma polyps in 2019  Risks, benefits, limitations, and alternatives regarding  colonoscopy have been reviewed with the patient.  Questions have been answered.  All parties agreeable.   Virgel Manifold, MD  02/19/2020, 10:04 AM

## 2020-02-19 NOTE — Anesthesia Postprocedure Evaluation (Signed)
Anesthesia Post Note  Patient: Timothy Knapp  Procedure(s) Performed: COLONOSCOPY WITH PROPOFOL (N/A )  Patient location during evaluation: Endoscopy Anesthesia Type: General Level of consciousness: awake and alert Pain management: pain level controlled Vital Signs Assessment: post-procedure vital signs reviewed and stable Respiratory status: spontaneous breathing, nonlabored ventilation, respiratory function stable and patient connected to nasal cannula oxygen Cardiovascular status: blood pressure returned to baseline and stable Postop Assessment: no apparent nausea or vomiting Anesthetic complications: no   No complications documented.   Last Vitals:  Vitals:   02/19/20 1041 02/19/20 1051  BP: 102/62 121/67  Pulse: 62   Resp: 13   Temp: (!) 36.2 C   SpO2: 95%     Last Pain:  Vitals:   02/19/20 1111  TempSrc:   PainSc: 0-No pain                 Arita Miss

## 2020-02-22 ENCOUNTER — Encounter: Payer: Self-pay | Admitting: Gastroenterology

## 2020-02-22 LAB — SURGICAL PATHOLOGY

## 2020-02-25 DIAGNOSIS — I10 Essential (primary) hypertension: Secondary | ICD-10-CM | POA: Diagnosis not present

## 2020-02-25 DIAGNOSIS — B0229 Other postherpetic nervous system involvement: Secondary | ICD-10-CM | POA: Diagnosis not present

## 2020-02-25 DIAGNOSIS — E785 Hyperlipidemia, unspecified: Secondary | ICD-10-CM | POA: Diagnosis not present

## 2020-02-25 DIAGNOSIS — Z125 Encounter for screening for malignant neoplasm of prostate: Secondary | ICD-10-CM | POA: Diagnosis not present

## 2020-02-26 ENCOUNTER — Ambulatory Visit: Payer: PPO | Attending: Anesthesiology | Admitting: Anesthesiology

## 2020-02-26 ENCOUNTER — Encounter: Payer: Self-pay | Admitting: Anesthesiology

## 2020-02-26 ENCOUNTER — Other Ambulatory Visit: Payer: Self-pay

## 2020-02-26 DIAGNOSIS — F1190 Opioid use, unspecified, uncomplicated: Secondary | ICD-10-CM

## 2020-02-26 DIAGNOSIS — B0229 Other postherpetic nervous system involvement: Secondary | ICD-10-CM | POA: Diagnosis not present

## 2020-02-26 DIAGNOSIS — G8929 Other chronic pain: Secondary | ICD-10-CM

## 2020-02-26 DIAGNOSIS — M5134 Other intervertebral disc degeneration, thoracic region: Secondary | ICD-10-CM | POA: Diagnosis not present

## 2020-02-26 DIAGNOSIS — M51369 Other intervertebral disc degeneration, lumbar region without mention of lumbar back pain or lower extremity pain: Secondary | ICD-10-CM

## 2020-02-26 DIAGNOSIS — M546 Pain in thoracic spine: Secondary | ICD-10-CM | POA: Diagnosis not present

## 2020-02-26 DIAGNOSIS — M5136 Other intervertebral disc degeneration, lumbar region: Secondary | ICD-10-CM | POA: Diagnosis not present

## 2020-02-26 DIAGNOSIS — M5416 Radiculopathy, lumbar region: Secondary | ICD-10-CM

## 2020-02-26 DIAGNOSIS — G894 Chronic pain syndrome: Secondary | ICD-10-CM | POA: Diagnosis not present

## 2020-02-26 HISTORY — DX: Other intervertebral disc degeneration, lumbar region without mention of lumbar back pain or lower extremity pain: M51.369

## 2020-02-26 HISTORY — DX: Other intervertebral disc degeneration, lumbar region: M51.36

## 2020-02-26 MED ORDER — HYDROCODONE-ACETAMINOPHEN 7.5-325 MG PO TABS: 1.0000 | ORAL_TABLET | Freq: Every day | ORAL | 0 refills | Status: DC

## 2020-02-26 MED ORDER — GABAPENTIN 100 MG PO CAPS
100.0000 mg | ORAL_CAPSULE | Freq: Four times a day (QID) | ORAL | 3 refills | Status: DC
Start: 2020-02-26 — End: 2021-01-20

## 2020-02-26 MED ORDER — HYDROCODONE-ACETAMINOPHEN 7.5-325 MG PO TABS: 1.0000 | ORAL_TABLET | Freq: Every day | ORAL | 0 refills | Status: AC | PRN

## 2020-02-26 NOTE — Progress Notes (Addendum)
Virtual Visit via Telephone Note  I connected with Timothy Knapp on 02/26/20 at  1:00 PM EST by telephone and verified that I am speaking with the correct person using two identifiers.  Location: Patient: Home Provider: Pain control center    I discussed the limitations, risks, security and privacy concerns of performing an evaluation and management service by telephone and the availability of in person appointments. I also discussed with the patient that there may be a patient responsible charge related to this service. The patient expressed understanding and agreed to proceed.   History of Present Illness: Timothy Knapp continues to have right side thoracic pain with chronic lumbar low back pain and sciatica symptoms.  He is currently well managed on his hydrocodone at the 7.5 mg dose.  This continues to work well for him.  No side effects reported with the medications and he sleeps better with the hydrocodone.  He has tried other more conservative therapies that have been ineffective.  He is getting some relief with the gabapentin but notes that without the hydrocodone in addition at nighttime he does not have good sleep and the higher doses of gabapentin because morning somnolence.  No other side effects are reported.  He is continuing to do his daily stretching and strengthening core activities.  No change in lower extremity strength or function or bowel or bladder function is noted at this time.   Observations/Objective:  Current Outpatient Medications:  .  atorvastatin (LIPITOR) 20 MG tablet, Take 20 mg by mouth daily., Disp: , Rfl:  .  benazepril (LOTENSIN) 20 MG tablet, TAKE 1 TABLET(20 MG) BY MOUTH DAILY, Disp: 30 tablet, Rfl: 0 .  Cholecalciferol (VITAMIN D3 PO), Take by mouth., Disp: , Rfl:  .  Cyanocobalamin (B-12 PO), Take 1 Dose by mouth daily., Disp: , Rfl:  .  dutasteride (AVODART) 0.5 MG capsule, Take 0.5 mg by mouth daily., Disp: , Rfl:  .  gabapentin (NEURONTIN) 100 MG capsule, Take  1 capsule (100 mg total) by mouth 3 (three) times daily. 1 tablet at bedtime for 1 week, then increase to 2 tablets at bedtime for 1 week, and then add an additional tablet in the morning for a total of 300 mg/day as a maximum dose., Disp: 90 capsule, Rfl: 0 .  HYDROcodone-acetaminophen (NORCO) 7.5-325 MG tablet, Take 1 tablet by mouth daily as needed for moderate pain or severe pain., Disp: 30 tablet, Rfl: 0 .  meloxicam (MOBIC) 7.5 MG tablet, TAKE 1 TABLET(7.5 MG) BY MOUTH EVERY DAY, Disp: , Rfl:  .  Omega-3 Fatty Acids (OMEGA-3 PO), Take 788 mg by mouth daily., Disp: , Rfl:  .  vitamin C (ASCORBIC ACID) 500 MG tablet, Take 500 mg by mouth daily., Disp: , Rfl:  .  zinc gluconate 50 MG tablet, Take 1 tablet by mouth daily., Disp: , Rfl:   Assessment and Plan:  1. Chronic right-sided thoracic back pain   2. DDD (degenerative disc disease), thoracic   3. Chronic pain syndrome   4. Chronic, continuous use of opioids   5. Postherpetic neuralgia   6. DDD (degenerative disc disease), lumbar   7. Chronic radicular pain of lower back   I have reviewed the [facility] practitioner database information and it is appropriate for refills for his hydrocodone.  He is to be dated for 218 and 316 for the 7.5 mg tablet to be taken each night.  He is also to continue with his current gabapentin at approximately 200 to 300 mg  at night and he can take 1 additional tablet in the morning.  He continues to have complaints of chronic low back pain with radiation into the right abdomen and to the right leg.  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  I encouraged him to continue with stretching strengthening exercises as best tolerated and continue activities with core strengthening and meloxicam at the 7.5 mg dose.  He is contact us for  return to clinic in 2 months. Follow Up Instructions:    I discussed the assessment and treatment plan with the patient. The patient was provided an opportunity to ask questions and all were answered. The patient agreed with the plan and demonstrated an understanding of the instructions.   The patient was advised to call back or seek an in-person evaluation if the symptoms worsen or if the condition fails to improve as anticipated.  I provided 30 minutes of non-face-to-face time during this encounter.   [name], MD

## 2020-02-29 ENCOUNTER — Other Ambulatory Visit: Payer: Self-pay | Admitting: Anesthesiology

## 2020-03-10 ENCOUNTER — Encounter: Payer: Self-pay | Admitting: Anesthesiology

## 2020-04-20 ENCOUNTER — Other Ambulatory Visit: Payer: Self-pay

## 2020-04-20 ENCOUNTER — Ambulatory Visit: Payer: PPO | Attending: Anesthesiology | Admitting: Anesthesiology

## 2020-04-20 DIAGNOSIS — F1190 Opioid use, unspecified, uncomplicated: Secondary | ICD-10-CM

## 2020-04-20 DIAGNOSIS — M5136 Other intervertebral disc degeneration, lumbar region: Secondary | ICD-10-CM

## 2020-04-20 DIAGNOSIS — G8929 Other chronic pain: Secondary | ICD-10-CM | POA: Diagnosis not present

## 2020-04-20 DIAGNOSIS — G894 Chronic pain syndrome: Secondary | ICD-10-CM

## 2020-04-20 DIAGNOSIS — M546 Pain in thoracic spine: Secondary | ICD-10-CM

## 2020-04-20 DIAGNOSIS — M5416 Radiculopathy, lumbar region: Secondary | ICD-10-CM

## 2020-04-20 DIAGNOSIS — M5134 Other intervertebral disc degeneration, thoracic region: Secondary | ICD-10-CM

## 2020-04-20 DIAGNOSIS — B0229 Other postherpetic nervous system involvement: Secondary | ICD-10-CM

## 2020-04-21 MED ORDER — HYDROCODONE-ACETAMINOPHEN 7.5-325 MG PO TABS
1.0000 | ORAL_TABLET | Freq: Every day | ORAL | 0 refills | Status: DC
Start: 2020-05-21 — End: 2020-06-14

## 2020-04-21 MED ORDER — HYDROCODONE-ACETAMINOPHEN 7.5-325 MG PO TABS: 1.0000 | ORAL_TABLET | Freq: Every day | ORAL | 0 refills | Status: AC

## 2020-04-21 NOTE — Progress Notes (Signed)
Virtual Visit via Telephone Note  I connected with Timothy Knapp on 04/21/20 at  2:30 PM EDT by telephone and verified that I am speaking with the correct person using two identifiers.  Location: Patient: Home Provider: Pain control center   I discussed the limitations, risks, security and privacy concerns of performing an evaluation and management service by telephone and the availability of in person appointments. I also discussed with the patient that there may be a patient responsible charge related to this service. The patient expressed understanding and agreed to proceed.   History of Present Illness: I spoke with Timothy Knapp today via telephone as we were unable to link for the video portion of the virtual conference and he states that he is doing well with his current regimen for pain control.  He presently takes half of a 7.5 mg hydrocodone tablet at nighttime and might use a second half of a tablet 3 to 4 hours later if he has breakthrough pain.  The pain is stable in nature with no recent changes in quality characteristic or distribution.   A primarily bothers him in the right side thoracic region and has been there for a protracted period of time unfortunately.  The quality of the pain is stable and unfortunately has been poorly controlled with more conservative therapy.  With his current opioid regimen he is getting good relief.  Per our conversation he continues to derive good functional benefit with no side effects reported. Observations/Objective:  Current Outpatient Medications:  .  [START ON 05/21/2020] HYDROcodone-acetaminophen (NORCO) 7.5-325 MG tablet, Take 1 tablet by mouth at bedtime., Disp: 30 tablet, Rfl: 0 .  atorvastatin (LIPITOR) 20 MG tablet, Take 20 mg by mouth daily., Disp: , Rfl:  .  benazepril (LOTENSIN) 20 MG tablet, TAKE 1 TABLET(20 MG) BY MOUTH DAILY, Disp: 30 tablet, Rfl: 0 .  Cholecalciferol (VITAMIN D3 PO), Take by mouth., Disp: , Rfl:  .  Cyanocobalamin (B-12  PO), Take 1 Dose by mouth daily., Disp: , Rfl:  .  dutasteride (AVODART) 0.5 MG capsule, Take 0.5 mg by mouth daily., Disp: , Rfl:  .  gabapentin (NEURONTIN) 100 MG capsule, Take 1 capsule (100 mg total) by mouth 4 (four) times daily. 1 tablet at bedtime for 1 week, then increase to 2 tablets at bedtime for 1 week, and then add an additional tablet in the morning for a total of 300 mg/day as a maximum dose., Disp: 120 capsule, Rfl: 3 .  HYDROcodone-acetaminophen (NORCO) 7.5-325 MG tablet, Take 1 tablet by mouth at bedtime., Disp: 30 tablet, Rfl: 0 .  meloxicam (MOBIC) 7.5 MG tablet, TAKE 1 TABLET(7.5 MG) BY MOUTH EVERY DAY, Disp: , Rfl:  .  Omega-3 Fatty Acids (OMEGA-3 PO), Take 788 mg by mouth daily., Disp: , Rfl:  .  vitamin C (ASCORBIC ACID) 500 MG tablet, Take 500 mg by mouth daily., Disp: , Rfl:  .  zinc gluconate 50 MG tablet, Take 1 tablet by mouth daily., Disp: , Rfl:   Assessment and Plan: 1. Chronic right-sided thoracic back pain   2. DDD (degenerative disc disease), thoracic   3. Chronic pain syndrome   4. Chronic, continuous use of opioids   5. Postherpetic neuralgia   6. DDD (degenerative disc disease), lumbar   7. Chronic radicular pain of lower back   Per our discussion and upon review of the [facility] practitioner database information I think it is appropriate to refill his medications.  This will be done for April  14 and May 14.  We have had a long discussion regarding the risks and benefits of chronic opioid maintenance therapy.  Based on his historical response to therapy and current response to therapy I feel like he is doing very well with the chronic opioid maintenance program.  He is taking these as prescribed with no side effects and he continues to derive good functional benefit with the medications.  No other changes will be initiated in his chronic pain management protocol today.  I encouraged him to continue follow-up with his primary care physician for his baseline  medical care with the schedule return to clinic in 2 months.  Follow Up Instructions:    I discussed the assessment and treatment plan with the patient. The patient was provided an opportunity to ask questions and all were answered. The patient agreed with the plan and demonstrated an understanding of the instructions.   The patient was advised to call back or seek an in-person evaluation if the symptoms worsen or if the condition fails to improve as anticipated.  I provided 30 minutes of non-face-to-face time during this encounter.   [name], MD

## 2020-05-13 DIAGNOSIS — Z85828 Personal history of other malignant neoplasm of skin: Secondary | ICD-10-CM | POA: Diagnosis not present

## 2020-05-13 DIAGNOSIS — L309 Dermatitis, unspecified: Secondary | ICD-10-CM | POA: Diagnosis not present

## 2020-05-13 DIAGNOSIS — L570 Actinic keratosis: Secondary | ICD-10-CM | POA: Diagnosis not present

## 2020-05-13 DIAGNOSIS — L820 Inflamed seborrheic keratosis: Secondary | ICD-10-CM | POA: Diagnosis not present

## 2020-05-13 DIAGNOSIS — L821 Other seborrheic keratosis: Secondary | ICD-10-CM | POA: Diagnosis not present

## 2020-05-13 DIAGNOSIS — D1801 Hemangioma of skin and subcutaneous tissue: Secondary | ICD-10-CM | POA: Diagnosis not present

## 2020-05-13 DIAGNOSIS — L814 Other melanin hyperpigmentation: Secondary | ICD-10-CM | POA: Diagnosis not present

## 2020-06-14 ENCOUNTER — Encounter: Payer: Self-pay | Admitting: Anesthesiology

## 2020-06-14 ENCOUNTER — Ambulatory Visit: Payer: PPO | Attending: Anesthesiology | Admitting: Anesthesiology

## 2020-06-14 ENCOUNTER — Other Ambulatory Visit: Payer: Self-pay

## 2020-06-14 DIAGNOSIS — M546 Pain in thoracic spine: Secondary | ICD-10-CM

## 2020-06-14 DIAGNOSIS — M5134 Other intervertebral disc degeneration, thoracic region: Secondary | ICD-10-CM | POA: Diagnosis not present

## 2020-06-14 DIAGNOSIS — M5136 Other intervertebral disc degeneration, lumbar region: Secondary | ICD-10-CM

## 2020-06-14 DIAGNOSIS — B0229 Other postherpetic nervous system involvement: Secondary | ICD-10-CM

## 2020-06-14 DIAGNOSIS — F1190 Opioid use, unspecified, uncomplicated: Secondary | ICD-10-CM | POA: Diagnosis not present

## 2020-06-14 DIAGNOSIS — G8929 Other chronic pain: Secondary | ICD-10-CM | POA: Diagnosis not present

## 2020-06-14 DIAGNOSIS — G894 Chronic pain syndrome: Secondary | ICD-10-CM | POA: Diagnosis not present

## 2020-06-14 DIAGNOSIS — M51369 Other intervertebral disc degeneration, lumbar region without mention of lumbar back pain or lower extremity pain: Secondary | ICD-10-CM

## 2020-06-14 MED ORDER — HYDROCODONE-ACETAMINOPHEN 7.5-325 MG PO TABS
1.0000 | ORAL_TABLET | Freq: Two times a day (BID) | ORAL | 0 refills | Status: AC | PRN
Start: 2020-06-14 — End: 2020-07-14

## 2020-06-14 MED ORDER — HYDROCODONE-ACETAMINOPHEN 7.5-325 MG PO TABS: 1.0000 | ORAL_TABLET | Freq: Two times a day (BID) | ORAL | 0 refills | Status: AC | PRN

## 2020-06-14 NOTE — Progress Notes (Signed)
Virtual Visit via Telephone Note  I connected with Timothy Knapp on 06/14/20 at  3:30 PM EDT by telephone and verified that I am speaking with the correct person using two identifiers.  Location: Patient: Home Provider: Pain control center   I discussed the limitations, risks, security and privacy concerns of performing an evaluation and management service by telephone and the availability of in person appointments. I also discussed with the patient that there may be a patient responsible charge related to this service. The patient expressed understanding and agreed to proceed.   History of Present Illness:  I was able to speak with Timothy Knapp today via telephone as I was unable to connect for the video portion of the virtual conference but he reports that he has been doing reasonably well.  He does note an intensification of his symptoms with weather changes.  At that point the postherpetic neuralgia does intensify.  He is taking Percocet 7.5 mg tablets and generally using approximately 1/day but does breakthrough at other times during the day with intense pain.  He generally gets 50 to 75% relief lasting about 6 hours or so before he has recurrence of pain.  He in the past has tried gabapentin at bedtime but this does not give him much relief in pain.  Otherwise he is in his usual state of health and any changes today.  Review of systems: Constitutional: No fevers chills cough nausea Pulmonary: No dyspnea or wheezing Cardiac: No palpitations or angina GI: No diarrhea constipation Observations/Objective:  Current Outpatient Medications:  .  [START ON 07/14/2020] HYDROcodone-acetaminophen (NORCO) 7.5-325 MG tablet, Take 1 tablet by mouth 2 (two) times daily as needed for moderate pain or severe pain., Disp: 45 tablet, Rfl: 0 .  atorvastatin (LIPITOR) 20 MG tablet, Take 20 mg by mouth daily., Disp: , Rfl:  .  benazepril (LOTENSIN) 20 MG tablet, TAKE 1 TABLET(20 MG) BY MOUTH DAILY, Disp: 30  tablet, Rfl: 0 .  Cholecalciferol (VITAMIN D3 PO), Take by mouth., Disp: , Rfl:  .  Cyanocobalamin (B-12 PO), Take 1 Dose by mouth daily., Disp: , Rfl:  .  dutasteride (AVODART) 0.5 MG capsule, Take 0.5 mg by mouth daily., Disp: , Rfl:  .  gabapentin (NEURONTIN) 100 MG capsule, Take 1 capsule (100 mg total) by mouth 4 (four) times daily. 1 tablet at bedtime for 1 week, then increase to 2 tablets at bedtime for 1 week, and then add an additional tablet in the morning for a total of 300 mg/day as a maximum dose., Disp: 120 capsule, Rfl: 3 .  HYDROcodone-acetaminophen (NORCO) 7.5-325 MG tablet, Take 1 tablet by mouth 2 (two) times daily as needed for moderate pain., Disp: 45 tablet, Rfl: 0 .  meloxicam (MOBIC) 7.5 MG tablet, TAKE 1 TABLET(7.5 MG) BY MOUTH EVERY DAY, Disp: , Rfl:  .  Omega-3 Fatty Acids (OMEGA-3 PO), Take 788 mg by mouth daily., Disp: , Rfl:  .  vitamin C (ASCORBIC ACID) 500 MG tablet, Take 500 mg by mouth daily., Disp: , Rfl:  .  zinc gluconate 50 MG tablet, Take 1 tablet by mouth daily., Disp: , Rfl:   Assessment and Plan: 1. Chronic right-sided thoracic back pain   2. DDD (degenerative disc disease), thoracic   3. Chronic pain syndrome   4. Chronic, continuous use of opioids   5. Postherpetic neuralgia   6. DDD (degenerative disc disease), lumbar   Based on our discussion today and upon review of the [facility] practitioner database  information it is appropriate for refill.  I do believe that based on the intensity of his pain and the frequency and an increase in medication would be appropriate.  I talked her about using the Percocet 7.5 mg tablets twice a day on his bad days and reduce this to once a day or use anti-inflammatories to assist with potential for tolerance as discussed.  I am increasing him to 45 tablets/month.  He came use the gabapentin at bedtime at 100 or 200 mg to see if this can help with sleep and possibly some pain control especially if he is having  intensification of the neurogenic component.  I want him to continue follow-up with his primary care physicians for baseline care.  We have requested routine UDS with return to clinic in 2 months.  Follow Up Instructions:    I discussed the assessment and treatment plan with the patient. The patient was provided an opportunity to ask questions and all were answered. The patient agreed with the plan and demonstrated an understanding of the instructions.   The patient was advised to call back or seek an in-person evaluation if the symptoms worsen or if the condition fails to improve as anticipated.  I provided 25via minutes of non-face-to-face time during this encounter.   [name], MD

## 2020-06-29 ENCOUNTER — Telehealth: Payer: PPO | Admitting: Anesthesiology

## 2020-07-06 ENCOUNTER — Other Ambulatory Visit: Payer: Self-pay | Admitting: Anesthesiology

## 2020-07-06 DIAGNOSIS — G894 Chronic pain syndrome: Secondary | ICD-10-CM | POA: Diagnosis not present

## 2020-07-06 DIAGNOSIS — F1190 Opioid use, unspecified, uncomplicated: Secondary | ICD-10-CM | POA: Diagnosis not present

## 2020-07-14 LAB — TOXASSURE SELECT 13 (MW), URINE

## 2020-07-18 ENCOUNTER — Telehealth: Payer: Self-pay | Admitting: Anesthesiology

## 2020-07-18 NOTE — Telephone Encounter (Signed)
I called pharmacy to confirm that there is a prescription for Hydrocodone that is dated 07-14-20. Patient notified.

## 2020-07-18 NOTE — Telephone Encounter (Signed)
Patient called asking for refill on Hydrocodone, he came in last week for UDS. Please let patient know when he can pick up

## 2020-08-16 DIAGNOSIS — I10 Essential (primary) hypertension: Secondary | ICD-10-CM | POA: Diagnosis not present

## 2020-08-16 DIAGNOSIS — E785 Hyperlipidemia, unspecified: Secondary | ICD-10-CM | POA: Diagnosis not present

## 2020-08-16 DIAGNOSIS — Z125 Encounter for screening for malignant neoplasm of prostate: Secondary | ICD-10-CM | POA: Diagnosis not present

## 2020-08-18 ENCOUNTER — Telehealth: Payer: PPO | Admitting: Anesthesiology

## 2020-08-19 ENCOUNTER — Encounter: Payer: Self-pay | Admitting: Anesthesiology

## 2020-08-19 ENCOUNTER — Other Ambulatory Visit: Payer: Self-pay

## 2020-08-19 ENCOUNTER — Ambulatory Visit: Payer: PPO | Attending: Anesthesiology | Admitting: Anesthesiology

## 2020-08-19 DIAGNOSIS — M5416 Radiculopathy, lumbar region: Secondary | ICD-10-CM | POA: Diagnosis not present

## 2020-08-19 DIAGNOSIS — F1190 Opioid use, unspecified, uncomplicated: Secondary | ICD-10-CM

## 2020-08-19 DIAGNOSIS — M546 Pain in thoracic spine: Secondary | ICD-10-CM | POA: Diagnosis not present

## 2020-08-19 DIAGNOSIS — B0229 Other postherpetic nervous system involvement: Secondary | ICD-10-CM | POA: Diagnosis not present

## 2020-08-19 DIAGNOSIS — M5136 Other intervertebral disc degeneration, lumbar region: Secondary | ICD-10-CM | POA: Diagnosis not present

## 2020-08-19 DIAGNOSIS — G8929 Other chronic pain: Secondary | ICD-10-CM | POA: Diagnosis not present

## 2020-08-19 DIAGNOSIS — G894 Chronic pain syndrome: Secondary | ICD-10-CM

## 2020-08-19 DIAGNOSIS — M5134 Other intervertebral disc degeneration, thoracic region: Secondary | ICD-10-CM | POA: Diagnosis not present

## 2020-08-19 MED ORDER — HYDROCODONE-ACETAMINOPHEN 7.5-325 MG PO TABS: 1.0000 | ORAL_TABLET | Freq: Two times a day (BID) | ORAL | 0 refills | Status: AC

## 2020-08-19 MED ORDER — HYDROCODONE-ACETAMINOPHEN 7.5-325 MG PO TABS: 1.0000 | ORAL_TABLET | Freq: Two times a day (BID) | ORAL | 0 refills | Status: DC

## 2020-08-19 NOTE — Progress Notes (Addendum)
Virtual Visit via Video Note  I connected with Timothy Knapp on 08/19/20 at  1:20 PM EDT by a telephone and verified that I was speaking with the correct person using two identifiers.  Location: Patient: Home Provider: Pain control center   I discussed the limitations of evaluation and management by telemedicine and the availability of in person appointments. The patient expressed understanding and agreed to proceed.  History of Present Illness:  I spoke with Timothy Knapp today via telephone.  I was unable to link for the video portion of the virtual conference but he reports he is doing well with his thoracic pain.  He is taking 1-1/2 of the hydrocodone 7.5 mg strength to keep his pain under good control during the day and this enables him to stay active and functional.  No side effects with the medication reported.  He still take his meloxicam at the 7.5 mg strength and has discontinued the gabapentin.  This combination presently is working well for him and he feels that his pain is staying under good control.  Otherwise he is in his usual state of health.  No other changes in upper or lower extremity strength are noted and he denies any side effects with his current medication management. Review of systems: General: No fevers or chills Pulmonary: No shortness of breath or dyspnea Cardiac: No angina or palpitations or lightheadedness GI: No abdominal pain or constipation Psych: No depression   Observations/Objective:  Current Outpatient Medications:    HYDROcodone-acetaminophen (NORCO) 7.5-325 MG tablet, Take 1 tablet by mouth 2 (two) times daily., Disp: 45 tablet, Rfl: 0   [START ON 09/18/2020] HYDROcodone-acetaminophen (NORCO) 7.5-325 MG tablet, Take 1 tablet by mouth 2 (two) times daily., Disp: 45 tablet, Rfl: 0   atorvastatin (LIPITOR) 20 MG tablet, Take 20 mg by mouth daily., Disp: , Rfl:    benazepril (LOTENSIN) 20 MG tablet, TAKE 1 TABLET(20 MG) BY MOUTH DAILY, Disp: 30 tablet, Rfl: 0    Cholecalciferol (VITAMIN D3 PO), Take by mouth., Disp: , Rfl:    Cyanocobalamin (B-12 PO), Take 1 Dose by mouth daily., Disp: , Rfl:    dutasteride (AVODART) 0.5 MG capsule, Take 0.5 mg by mouth daily., Disp: , Rfl:    gabapentin (NEURONTIN) 100 MG capsule, Take 1 capsule (100 mg total) by mouth 4 (four) times daily. 1 tablet at bedtime for 1 week, then increase to 2 tablets at bedtime for 1 week, and then add an additional tablet in the morning for a total of 300 mg/day as a maximum dose., Disp: 120 capsule, Rfl: 3   meloxicam (MOBIC) 7.5 MG tablet, TAKE 1 TABLET(7.5 MG) BY MOUTH EVERY DAY, Disp: , Rfl:    Omega-3 Fatty Acids (OMEGA-3 PO), Take 788 mg by mouth daily., Disp: , Rfl:    vitamin C (ASCORBIC ACID) 500 MG tablet, Take 500 mg by mouth daily., Disp: , Rfl:    zinc gluconate 50 MG tablet, Take 1 tablet by mouth daily., Disp: , Rfl:    Assessment and Plan: 1. Chronic right-sided thoracic back pain   2. DDD (degenerative disc disease), thoracic   3. Chronic pain syndrome   4. Chronic, continuous use of opioids   5. Postherpetic neuralgia   6. DDD (degenerative disc disease), lumbar   7. Chronic radicular pain of lower back   Based on our discussion today and upon review of the [facility] practitioner database information it is appropriate for refill for Timothy Knapp.  He has been called in for  August 12 and September 11.  No other changes in his pain management protocol will be initiated.  Wanted to continue the meloxicam and we will discontinue the gabapentin as per his current status.  Furthermore continue stretching strengthening exercises.  He is scheduled for 93-monthreturn.  His most recent urine screen back in June was appropriate.  He is to continue follow-up with his primary care physicians for baseline medical care as well.  No other changes made today.  Follow Up Instructions:    I discussed the assessment and treatment plan with the patient. The patient was provided an  opportunity to ask questions and all were answered. The patient agreed with the plan and demonstrated an understanding of the instructions.   The patient was advised to call back or seek an in-person evaluation if the symptoms worsen or if the condition fails to improve as anticipated.  I provided 25 minutes of non-face-to-face time during this encounter.   JMolli Barrows MD

## 2020-08-23 DIAGNOSIS — I10 Essential (primary) hypertension: Secondary | ICD-10-CM | POA: Diagnosis not present

## 2020-08-23 DIAGNOSIS — R7989 Other specified abnormal findings of blood chemistry: Secondary | ICD-10-CM | POA: Diagnosis not present

## 2020-08-23 DIAGNOSIS — E785 Hyperlipidemia, unspecified: Secondary | ICD-10-CM | POA: Diagnosis not present

## 2020-08-23 DIAGNOSIS — Z0000 Encounter for general adult medical examination without abnormal findings: Secondary | ICD-10-CM | POA: Diagnosis not present

## 2020-08-23 DIAGNOSIS — M5134 Other intervertebral disc degeneration, thoracic region: Secondary | ICD-10-CM | POA: Diagnosis not present

## 2020-10-03 ENCOUNTER — Other Ambulatory Visit: Payer: Self-pay

## 2020-10-03 ENCOUNTER — Encounter: Payer: Self-pay | Admitting: Anesthesiology

## 2020-10-03 ENCOUNTER — Ambulatory Visit: Payer: PPO | Attending: Anesthesiology | Admitting: Anesthesiology

## 2020-10-03 DIAGNOSIS — M5134 Other intervertebral disc degeneration, thoracic region: Secondary | ICD-10-CM

## 2020-10-03 DIAGNOSIS — G8929 Other chronic pain: Secondary | ICD-10-CM | POA: Diagnosis not present

## 2020-10-03 DIAGNOSIS — M5136 Other intervertebral disc degeneration, lumbar region: Secondary | ICD-10-CM

## 2020-10-03 DIAGNOSIS — M546 Pain in thoracic spine: Secondary | ICD-10-CM | POA: Diagnosis not present

## 2020-10-03 DIAGNOSIS — G894 Chronic pain syndrome: Secondary | ICD-10-CM | POA: Diagnosis not present

## 2020-10-03 DIAGNOSIS — F1190 Opioid use, unspecified, uncomplicated: Secondary | ICD-10-CM | POA: Diagnosis not present

## 2020-10-03 DIAGNOSIS — B0229 Other postherpetic nervous system involvement: Secondary | ICD-10-CM | POA: Diagnosis not present

## 2020-10-03 DIAGNOSIS — M51369 Other intervertebral disc degeneration, lumbar region without mention of lumbar back pain or lower extremity pain: Secondary | ICD-10-CM

## 2020-10-03 MED ORDER — HYDROCODONE-ACETAMINOPHEN 7.5-325 MG PO TABS: 1.0000 | ORAL_TABLET | Freq: Two times a day (BID) | ORAL | 0 refills | Status: AC

## 2020-10-03 MED ORDER — HYDROCODONE-ACETAMINOPHEN 7.5-325 MG PO TABS: 1.0000 | ORAL_TABLET | Freq: Two times a day (BID) | ORAL | 0 refills | Status: DC | PRN

## 2020-10-03 NOTE — Progress Notes (Signed)
Virtual Visit via Telephone Note  I connected with Timothy Knapp on 10/03/20 at  2:00 PM EDT by telephone and verified that I am speaking with the correct person using two identifiers.  Location: Patient: Home Provider: Pain control center   I discussed the limitations, risks, security and privacy concerns of performing an evaluation and management service by telephone and the availability of in person appointments. I also discussed with the patient that there may be a patient responsible charge related to this service. The patient expressed understanding and agreed to proceed.   History of Present Illness: I was able to reach Timothy Knapp via telephone today as we are unable to link for the video portion of the virtual conference and reports that he is doing well with his thoracic pain.  He still utilizing hydrocodone 7.5 mg strength sparingly.  He uses this approximately 1 time a day and occasionally up to 2 times a day if he is having more severe pain.  He reports that the quality characteristic and distribution of the pain has been stable for multiple years but is still present and problematic if he is not taking his opioid medications.  He is tried other therapeutic regimens and gets partial success but this has been the most effective therapy for him.  He is very judicious about his utilization and has no side effects with the medications.  He reports that he is doing well.  No other changes are noted per our discussion today.  Review of systems: General: No fevers or chills Pulmonary: No shortness of breath or dyspnea Cardiac: No angina or palpitations or lightheadedness GI: No abdominal pain or constipation Psych: No depression    Observations/Objective:  Current Outpatient Medications:    atorvastatin (LIPITOR) 20 MG tablet, Take 20 mg by mouth daily., Disp: , Rfl:    benazepril (LOTENSIN) 20 MG tablet, TAKE 1 TABLET(20 MG) BY MOUTH DAILY, Disp: 30 tablet, Rfl: 0   Cholecalciferol  (VITAMIN D3 PO), Take by mouth., Disp: , Rfl:    Cyanocobalamin (B-12 PO), Take 1 Dose by mouth daily., Disp: , Rfl:    dutasteride (AVODART) 0.5 MG capsule, Take 0.5 mg by mouth daily., Disp: , Rfl:    gabapentin (NEURONTIN) 100 MG capsule, Take 1 capsule (100 mg total) by mouth 4 (four) times daily. 1 tablet at bedtime for 1 week, then increase to 2 tablets at bedtime for 1 week, and then add an additional tablet in the morning for a total of 300 mg/day as a maximum dose., Disp: 120 capsule, Rfl: 3   HYDROcodone-acetaminophen (NORCO) 7.5-325 MG tablet, Take 1 tablet by mouth 2 (two) times daily., Disp: 45 tablet, Rfl: 0   HYDROcodone-acetaminophen (NORCO) 7.5-325 MG tablet, Take 1 tablet by mouth 2 (two) times daily as needed for moderate pain or severe pain., Disp: 45 tablet, Rfl: 0   meloxicam (MOBIC) 7.5 MG tablet, TAKE 1 TABLET(7.5 MG) BY MOUTH EVERY DAY, Disp: , Rfl:    Omega-3 Fatty Acids (OMEGA-3 PO), Take 788 mg by mouth daily., Disp: , Rfl:    vitamin C (ASCORBIC ACID) 500 MG tablet, Take 500 mg by mouth daily., Disp: , Rfl:    zinc gluconate 50 MG tablet, Take 1 tablet by mouth daily., Disp: , Rfl:    Assessment and Plan: 1. Chronic right-sided thoracic back pain   2. DDD (degenerative disc disease), thoracic   3. Chronic pain syndrome   4. Chronic, continuous use of opioids   5. Postherpetic neuralgia  6. DDD (degenerative disc disease), lumbar   Based on our discussion today and upon review of the [facility] practitioner database information is appropriate for refills.  He currently has a in September 11 prescription outstanding that he had not needed to fill.  We will override that with the September 26 and October 26 prescription for 45 tablets of the hydrocodone to be taken twice a day as needed.  No other changes in his pain management protocol will be initiated.  Labs scheduled for 91-month return to clinic for routine evaluation.  He is to continue follow-up with his primary  care physicians for baseline medical care.  Follow Up Instructions:    I discussed the assessment and treatment plan with the patient. The patient was provided an opportunity to ask questions and all were answered. The patient agreed with the plan and demonstrated an understanding of the instructions.   The patient was advised to call back or seek an in-person evaluation if the symptoms worsen or if the condition fails to improve as anticipated.  I provided 25 minutes of non-face-to-face time during this encounter.   [name], MD

## 2020-11-01 DIAGNOSIS — N401 Enlarged prostate with lower urinary tract symptoms: Secondary | ICD-10-CM | POA: Diagnosis not present

## 2020-11-01 DIAGNOSIS — N5201 Erectile dysfunction due to arterial insufficiency: Secondary | ICD-10-CM | POA: Diagnosis not present

## 2020-11-01 DIAGNOSIS — R9721 Rising PSA following treatment for malignant neoplasm of prostate: Secondary | ICD-10-CM | POA: Diagnosis not present

## 2020-11-01 DIAGNOSIS — Z79899 Other long term (current) drug therapy: Secondary | ICD-10-CM | POA: Diagnosis not present

## 2020-11-01 DIAGNOSIS — N481 Balanitis: Secondary | ICD-10-CM | POA: Diagnosis not present

## 2020-11-01 DIAGNOSIS — N471 Phimosis: Secondary | ICD-10-CM | POA: Diagnosis not present

## 2020-11-02 DIAGNOSIS — Z125 Encounter for screening for malignant neoplasm of prostate: Secondary | ICD-10-CM | POA: Diagnosis not present

## 2020-11-02 DIAGNOSIS — R972 Elevated prostate specific antigen [PSA]: Secondary | ICD-10-CM | POA: Diagnosis not present

## 2020-11-02 DIAGNOSIS — N401 Enlarged prostate with lower urinary tract symptoms: Secondary | ICD-10-CM | POA: Diagnosis not present

## 2020-11-06 ENCOUNTER — Other Ambulatory Visit: Payer: Self-pay | Admitting: Anesthesiology

## 2020-11-06 ENCOUNTER — Encounter: Payer: Self-pay | Admitting: Anesthesiology

## 2020-11-14 ENCOUNTER — Ambulatory Visit: Payer: PPO | Attending: Anesthesiology | Admitting: Anesthesiology

## 2020-11-14 ENCOUNTER — Other Ambulatory Visit: Payer: Self-pay

## 2020-11-14 ENCOUNTER — Encounter: Payer: Self-pay | Admitting: Anesthesiology

## 2020-11-14 DIAGNOSIS — B0229 Other postherpetic nervous system involvement: Secondary | ICD-10-CM

## 2020-11-14 DIAGNOSIS — M5136 Other intervertebral disc degeneration, lumbar region: Secondary | ICD-10-CM

## 2020-11-14 DIAGNOSIS — M5134 Other intervertebral disc degeneration, thoracic region: Secondary | ICD-10-CM

## 2020-11-14 DIAGNOSIS — M5416 Radiculopathy, lumbar region: Secondary | ICD-10-CM

## 2020-11-14 DIAGNOSIS — F1190 Opioid use, unspecified, uncomplicated: Secondary | ICD-10-CM | POA: Diagnosis not present

## 2020-11-14 DIAGNOSIS — M546 Pain in thoracic spine: Secondary | ICD-10-CM | POA: Diagnosis not present

## 2020-11-14 DIAGNOSIS — G894 Chronic pain syndrome: Secondary | ICD-10-CM | POA: Diagnosis not present

## 2020-11-14 DIAGNOSIS — G8929 Other chronic pain: Secondary | ICD-10-CM | POA: Diagnosis not present

## 2020-11-14 MED ORDER — HYDROCODONE-ACETAMINOPHEN 7.5-325 MG PO TABS: 1.0000 | ORAL_TABLET | Freq: Two times a day (BID) | ORAL | 0 refills | Status: DC | PRN

## 2020-11-14 MED ORDER — HYDROCODONE-ACETAMINOPHEN 7.5-325 MG PO TABS: 1.0000 | ORAL_TABLET | Freq: Two times a day (BID) | ORAL | 0 refills | Status: AC | PRN

## 2020-11-14 NOTE — Progress Notes (Signed)
Virtual Visit via Telephone Note  I connected with Timothy Knapp on 11/14/20 at 10:20 AM EST by telephone and verified that I am speaking with the correct person using two identifiers.  Location: Patient: Home Provider: Pain control center   I discussed the limitations, risks, security and privacy concerns of performing an evaluation and management service by telephone and the availability of in person appointments. I also discussed with the patient that there may be a patient responsible charge related to this service. The patient expressed understanding and agreed to proceed.   History of Present Illness: I spoke with Timothy Knapp today via telephone as we were unable link for the video portion of the conference but he reports that he has been doing well.  His right thoracic pain has been stable in nature and generally seems to be gradually improving.  He is able to do more things and withstand more activity than before.  He is tolerating activity better.  He takes his Vicodin 7.5 mg tablets approximately once or twice a day as needed and these are effective to keep his pain under control.  No side effects are reported.  Overall he feels that he is deriving good functional benefit from his medication and the intensity of his pain has been controlled.  Otherwise he is in his usual state of health with no new contributory changes mentioned today.  He is staying active and feels that this has be beneficial as well.  Review of systems: General: No fevers or chills Pulmonary: No shortness of breath or dyspnea Cardiac: No angina or palpitations or lightheadedness GI: No abdominal pain or constipation Psych: No depression    Observations/Objective:   Current Outpatient Medications:    [START ON 12/14/2020] HYDROcodone-acetaminophen (NORCO) 7.5-325 MG tablet, Take 1 tablet by mouth 2 (two) times daily as needed for moderate pain or severe pain., Disp: 45 tablet, Rfl: 0   atorvastatin (LIPITOR) 20 MG  tablet, Take 20 mg by mouth daily., Disp: , Rfl:    benazepril (LOTENSIN) 20 MG tablet, TAKE 1 TABLET(20 MG) BY MOUTH DAILY, Disp: 30 tablet, Rfl: 0   Cholecalciferol (VITAMIN D3 PO), Take by mouth., Disp: , Rfl:    Cyanocobalamin (B-12 PO), Take 1 Dose by mouth daily., Disp: , Rfl:    dutasteride (AVODART) 0.5 MG capsule, Take 0.5 mg by mouth daily., Disp: , Rfl:    gabapentin (NEURONTIN) 100 MG capsule, Take 1 capsule (100 mg total) by mouth 4 (four) times daily. 1 tablet at bedtime for 1 week, then increase to 2 tablets at bedtime for 1 week, and then add an additional tablet in the morning for a total of 300 mg/day as a maximum dose., Disp: 120 capsule, Rfl: 3   HYDROcodone-acetaminophen (NORCO) 7.5-325 MG tablet, Take 1 tablet by mouth 2 (two) times daily as needed for moderate pain or severe pain., Disp: 45 tablet, Rfl: 0   meloxicam (MOBIC) 7.5 MG tablet, TAKE 1 TABLET(7.5 MG) BY MOUTH EVERY DAY, Disp: , Rfl:    Omega-3 Fatty Acids (OMEGA-3 PO), Take 788 mg by mouth daily., Disp: , Rfl:    vitamin C (ASCORBIC ACID) 500 MG tablet, Take 500 mg by mouth daily., Disp: , Rfl:    zinc gluconate 50 MG tablet, Take 1 tablet by mouth daily., Disp: , Rfl:   Assessment and Plan: 1. Chronic right-sided thoracic back pain   2. DDD (degenerative disc disease), thoracic   3. Chronic pain syndrome   4. Chronic, continuous use of opioids  5. Postherpetic neuralgia   6. DDD (degenerative disc disease), lumbar   7. Chronic radicular pain of lower back   Based on our discussion today and upon review of the [facility] and practitioner database information it is appropriate for refill of his medications.  He is overdue for refill today and we will assist with that.  Refills will be for today and 1 month from today.  I encouraged him to continue with his activities.  He has been able to discontinue his gabapentin.  I want him to continue follow-up with his primary care physicians for  baseline medical care and we will schedule for a return in 2 months.  Follow Up Instructions:    I discussed the assessment and treatment plan with the patient. The patient was provided an opportunity to ask questions and all were answered. The patient agreed with the plan and demonstrated an understanding of the instructions.   The patient was advised to call back or seek an in-person evaluation if the symptoms worsen or if the condition fails to improve as anticipated.  I provided 30 minutes of non-face-to-face time during this encounter.   [name], MD

## 2020-11-18 ENCOUNTER — Other Ambulatory Visit: Payer: Self-pay | Admitting: Anesthesiology

## 2020-12-13 DIAGNOSIS — M1712 Unilateral primary osteoarthritis, left knee: Secondary | ICD-10-CM | POA: Diagnosis not present

## 2020-12-13 DIAGNOSIS — Z96651 Presence of right artificial knee joint: Secondary | ICD-10-CM | POA: Diagnosis not present

## 2020-12-20 ENCOUNTER — Ambulatory Visit: Payer: PPO | Attending: Anesthesiology | Admitting: Anesthesiology

## 2020-12-20 ENCOUNTER — Other Ambulatory Visit: Payer: Self-pay

## 2020-12-20 ENCOUNTER — Encounter: Payer: Self-pay | Admitting: Anesthesiology

## 2020-12-20 DIAGNOSIS — B0229 Other postherpetic nervous system involvement: Secondary | ICD-10-CM | POA: Diagnosis not present

## 2020-12-20 DIAGNOSIS — G894 Chronic pain syndrome: Secondary | ICD-10-CM | POA: Diagnosis not present

## 2020-12-20 DIAGNOSIS — M5134 Other intervertebral disc degeneration, thoracic region: Secondary | ICD-10-CM | POA: Diagnosis not present

## 2020-12-20 DIAGNOSIS — M5416 Radiculopathy, lumbar region: Secondary | ICD-10-CM | POA: Diagnosis not present

## 2020-12-20 DIAGNOSIS — F1190 Opioid use, unspecified, uncomplicated: Secondary | ICD-10-CM | POA: Diagnosis not present

## 2020-12-20 DIAGNOSIS — M5136 Other intervertebral disc degeneration, lumbar region: Secondary | ICD-10-CM | POA: Diagnosis not present

## 2020-12-20 DIAGNOSIS — M546 Pain in thoracic spine: Secondary | ICD-10-CM | POA: Diagnosis not present

## 2020-12-20 DIAGNOSIS — G8929 Other chronic pain: Secondary | ICD-10-CM

## 2020-12-20 DIAGNOSIS — M51369 Other intervertebral disc degeneration, lumbar region without mention of lumbar back pain or lower extremity pain: Secondary | ICD-10-CM

## 2020-12-20 MED ORDER — HYDROCODONE-ACETAMINOPHEN 7.5-325 MG PO TABS: 1.0000 | ORAL_TABLET | Freq: Two times a day (BID) | ORAL | 0 refills | Status: DC

## 2020-12-20 MED ORDER — HYDROCODONE-ACETAMINOPHEN 7.5-325 MG PO TABS: 1.0000 | ORAL_TABLET | Freq: Two times a day (BID) | ORAL | 0 refills | Status: AC | PRN

## 2020-12-20 NOTE — Progress Notes (Signed)
Virtual Visit via Telephone Note  I connected with Timothy Knapp on 12/20/20 at  1:45 PM EST by telephone and verified that I am speaking with the correct person using two identifiers.  Location: Patient: Home Provider: Pain control center   I discussed the limitations, risks, security and privacy concerns of performing an evaluation and management service by telephone and the availability of in person appointments. I also discussed with the patient that there may be a patient responsible charge related to this service. The patient expressed understanding and agreed to proceed.   History of Present Illness: I was able  to speak with Timothy Knapp via telephone today as we are unable link for the video portion of the conference.  He states that he has been doing reasonably well with the recent increase to the 7.5 mg hydrocodone strength.  Unfortunately he has frequent breakthrough and is asking if he can increase to twice daily dosing.  No side effects are reported with the medicine.  He gets good relief rated approximately 75% improvement.  He has failed more conservative therapy and nothing else much has worked.  The quality characteristic and distribution pain also have remained stable.  Fortunately he feels that the hydrocodone is effective and enabling him to stay functional and active during the day and sleep better at night.  Review of systems: General: No fevers or chills Pulmonary: No shortness of breath or dyspnea Cardiac: No angina or palpitations or lightheadedness GI: No abdominal pain or constipation Psych: No depression    Observations/Objective:  Current Outpatient Medications:    [START ON 01/19/2021] HYDROcodone-acetaminophen (NORCO) 7.5-325 MG tablet, Take 1 tablet by mouth 2 (two) times daily., Disp: 60 tablet, Rfl: 0   atorvastatin (LIPITOR) 20 MG tablet, Take 20 mg by mouth daily., Disp: , Rfl:    benazepril (LOTENSIN) 20 MG tablet, TAKE 1 TABLET(20 MG) BY MOUTH DAILY, Disp:  30 tablet, Rfl: 0   Cholecalciferol (VITAMIN D3 PO), Take by mouth., Disp: , Rfl:    Cyanocobalamin (B-12 PO), Take 1 Dose by mouth daily., Disp: , Rfl:    dutasteride (AVODART) 0.5 MG capsule, Take 0.5 mg by mouth daily., Disp: , Rfl:    gabapentin (NEURONTIN) 100 MG capsule, Take 1 capsule (100 mg total) by mouth 4 (four) times daily. 1 tablet at bedtime for 1 week, then increase to 2 tablets at bedtime for 1 week, and then add an additional tablet in the morning for a total of 300 mg/day as a maximum dose., Disp: 120 capsule, Rfl: 3   HYDROcodone-acetaminophen (NORCO) 7.5-325 MG tablet, Take 1 tablet by mouth 2 (two) times daily as needed for moderate pain or severe pain., Disp: 45 tablet, Rfl: 0   HYDROcodone-acetaminophen (NORCO) 7.5-325 MG tablet, Take 1 tablet by mouth 2 (two) times daily as needed for moderate pain or severe pain., Disp: 60 tablet, Rfl: 0   meloxicam (MOBIC) 7.5 MG tablet, TAKE 1 TABLET(7.5 MG) BY MOUTH EVERY DAY, Disp: , Rfl:    Omega-3 Fatty Acids (OMEGA-3 PO), Take 788 mg by mouth daily., Disp: , Rfl:    vitamin C (ASCORBIC ACID) 500 MG tablet, Take 500 mg by mouth daily., Disp: , Rfl:    zinc gluconate 50 MG tablet, Take 1 tablet by mouth daily., Disp: , Rfl:    Assessment and Plan: 1. Chronic right-sided thoracic back pain   2. DDD (degenerative disc disease), thoracic   3. Chronic pain syndrome   4. Chronic, continuous use of opioids  5. Postherpetic neuralgia   6. DDD (degenerative disc disease), lumbar   7. Chronic radicular pain of lower back   Based on our discussion today and upon review of the [facility] practitioner database information it is appropriate for refill on his medication.  This to be scheduled for the next 2 months dated for December 13 today.  This will be for 60 pills of the 7.5 mg strength.  No other changes in her pain management protocol will be initiated.  I encouraged him to stay active with his stretching strengthening exercises as  previously reviewed with continue follow-up with [place] in the pain clinic in 2 months and continue follow-up with his primary care physicians for baseline medical care.  Follow Up Instructions:    I discussed the assessment and treatment plan with the patient. The patient was provided an opportunity to ask questions and all were answered. The patient agreed with the plan and demonstrated an understanding of the instructions.   The patient was advised to call back or seek an in-person evaluation if the symptoms worsen or if the condition fails to improve as anticipated.  I provided 30 minutes of non-face-to-face time during this encounter.   [name], MD

## 2021-01-18 ENCOUNTER — Telehealth: Payer: PPO | Admitting: Anesthesiology

## 2021-01-20 ENCOUNTER — Other Ambulatory Visit: Payer: Self-pay

## 2021-01-20 ENCOUNTER — Ambulatory Visit: Payer: PPO | Attending: Anesthesiology | Admitting: Anesthesiology

## 2021-01-20 ENCOUNTER — Encounter: Payer: Self-pay | Admitting: Anesthesiology

## 2021-01-20 DIAGNOSIS — M5134 Other intervertebral disc degeneration, thoracic region: Secondary | ICD-10-CM

## 2021-01-20 DIAGNOSIS — G8929 Other chronic pain: Secondary | ICD-10-CM | POA: Diagnosis not present

## 2021-01-20 DIAGNOSIS — B0229 Other postherpetic nervous system involvement: Secondary | ICD-10-CM | POA: Diagnosis not present

## 2021-01-20 DIAGNOSIS — M5416 Radiculopathy, lumbar region: Secondary | ICD-10-CM | POA: Diagnosis not present

## 2021-01-20 DIAGNOSIS — F1190 Opioid use, unspecified, uncomplicated: Secondary | ICD-10-CM

## 2021-01-20 DIAGNOSIS — G894 Chronic pain syndrome: Secondary | ICD-10-CM

## 2021-01-20 DIAGNOSIS — M546 Pain in thoracic spine: Secondary | ICD-10-CM | POA: Diagnosis not present

## 2021-01-20 DIAGNOSIS — M5136 Other intervertebral disc degeneration, lumbar region: Secondary | ICD-10-CM

## 2021-01-20 DIAGNOSIS — M51369 Other intervertebral disc degeneration, lumbar region without mention of lumbar back pain or lower extremity pain: Secondary | ICD-10-CM

## 2021-01-20 MED ORDER — HYDROCODONE-ACETAMINOPHEN 7.5-325 MG PO TABS: 1.0000 | ORAL_TABLET | Freq: Four times a day (QID) | ORAL | 0 refills | Status: DC | PRN

## 2021-01-26 NOTE — Progress Notes (Signed)
Virtual Visit via Telephone Note  I connected with [name] on 01/26/21 at  1:30 PM EST by telephone and verified that I am speaking with the correct person using two identifiers.  Location: Patient: Home Provider: Pain control center   I discussed the limitations, risks, security and privacy concerns of performing an evaluation and management service by telephone and the availability of in person appointments. I also discussed with the patient that there may be a patient responsible charge related to this service. The patient expressed understanding and agreed to proceed.   History of Present Illness: I spoke with Ed today via telephone as we were unable to link for the video portion of the conference and he states that he is doing well in regards to his baseline thoracic pain and post herpetic neuralgia.  The quality characteristic and distribution of been stable in nature.  He is doing better when he takes his opioid medication twice a day and this is giving him more thorough and complete coverage.  He rates the pain relief at approximately 75 to 80% improvement lasting about 6 hours before he has recurrence of his baseline pain.  Otherwise he is doing reasonably well.  He is trying to stay active.  Review of systems: General: No fevers or chills Pulmonary: No shortness of breath or dyspnea Cardiac: No angina or palpitations or lightheadedness GI: No abdominal pain or constipation Psych: No depression    Observations/Objective:  Current Outpatient Medications:    [START ON 02/18/2021] HYDROcodone-acetaminophen (NORCO) 7.5-325 MG tablet, Take 1 tablet by mouth every 6 (six) hours as needed for moderate pain or severe pain., Disp: 60 tablet, Rfl: 0   atorvastatin (LIPITOR) 20 MG tablet, Take 20 mg by mouth daily., Disp: , Rfl:    benazepril (LOTENSIN) 20 MG tablet, TAKE 1 TABLET(20 MG) BY MOUTH DAILY, Disp: 30 tablet, Rfl: 0   Cholecalciferol (VITAMIN D3 PO), Take by mouth., Disp: ,  Rfl:    Cyanocobalamin (B-12 PO), Take 1 Dose by mouth daily., Disp: , Rfl:    dutasteride (AVODART) 0.5 MG capsule, Take 0.5 mg by mouth daily., Disp: , Rfl:    HYDROcodone-acetaminophen (NORCO) 7.5-325 MG tablet, Take 1 tablet by mouth 2 (two) times daily., Disp: 60 tablet, Rfl: 0   meloxicam (MOBIC) 7.5 MG tablet, TAKE 1 TABLET(7.5 MG) BY MOUTH EVERY DAY, Disp: , Rfl:    Omega-3 Fatty Acids (OMEGA-3 PO), Take 788 mg by mouth daily., Disp: , Rfl:    vitamin C (ASCORBIC ACID) 500 MG tablet, Take 500 mg by mouth daily., Disp: , Rfl:    zinc gluconate 50 MG tablet, Take 1 tablet by mouth daily., Disp: , Rfl:   Past Medical History:  Diagnosis Date   Arthritis    BPH (benign prostatic hypertrophy)    DDD (degenerative disc disease), lumbar 02/26/2020   Hyperlipidemia    Hypertension     Assessment and Plan: 1. Chronic right-sided thoracic back pain   2. DDD (degenerative disc disease), thoracic   3. Chronic pain syndrome   4. Chronic, continuous use of opioids   5. Postherpetic neuralgia   6. DDD (degenerative disc disease), lumbar   7. Chronic radicular pain of lower back   Based on our discussion today and after review of the [facility] practitioner database information it is appropriate to put him on Mobic 7.5 twice a day for routine dosing.  I think this is giving him better coverage.  He is denying side effects and feels  that he is been more successful with that regimen.  No other changes will be initiated today.  I want him to continue follow-up with his primary care physicians and we will plan on a 110-month return.  Follow Up Instructions:    I discussed the assessment and treatment plan with the patient. The patient was provided an opportunity to ask questions and all were answered. The patient agreed with the plan and demonstrated an understanding of the instructions.   The patient was advised to call back or seek an in-person evaluation if the symptoms worsen or if the  condition fails to improve as anticipated.  I provided 30 minutes of non-face-to-face time during this encounter.   [name], MD

## 2021-02-11 DIAGNOSIS — Z03818 Encounter for observation for suspected exposure to other biological agents ruled out: Secondary | ICD-10-CM | POA: Diagnosis not present

## 2021-02-11 DIAGNOSIS — U071 COVID-19: Secondary | ICD-10-CM | POA: Diagnosis not present

## 2021-02-16 ENCOUNTER — Telehealth: Payer: Self-pay | Admitting: Anesthesiology

## 2021-02-16 NOTE — Telephone Encounter (Signed)
error 

## 2021-02-16 NOTE — Telephone Encounter (Signed)
Patient called stating the pharmacy does not have a script to fill. There is a script to fill on 02-18-21 in his chart.

## 2021-02-16 NOTE — Telephone Encounter (Signed)
Called to [facility] and they do have an Rx to fill on 02/18/21.  His last Rx was filled on 01/20/21 which would make his next Rx to fill on 01/19/21.  Patient states that is a Sunday, instructed that if they are not open on Sunday the Rx has a 1 day early fill for when pharmacies are closed.  Patient verbalizes u/o information.

## 2021-02-28 DIAGNOSIS — E785 Hyperlipidemia, unspecified: Secondary | ICD-10-CM | POA: Diagnosis not present

## 2021-02-28 DIAGNOSIS — R7989 Other specified abnormal findings of blood chemistry: Secondary | ICD-10-CM | POA: Diagnosis not present

## 2021-02-28 DIAGNOSIS — I10 Essential (primary) hypertension: Secondary | ICD-10-CM | POA: Diagnosis not present

## 2021-03-07 DIAGNOSIS — G8929 Other chronic pain: Secondary | ICD-10-CM | POA: Diagnosis not present

## 2021-03-07 DIAGNOSIS — E785 Hyperlipidemia, unspecified: Secondary | ICD-10-CM | POA: Diagnosis not present

## 2021-03-07 DIAGNOSIS — M25562 Pain in left knee: Secondary | ICD-10-CM | POA: Diagnosis not present

## 2021-03-07 DIAGNOSIS — I10 Essential (primary) hypertension: Secondary | ICD-10-CM | POA: Diagnosis not present

## 2021-03-07 DIAGNOSIS — Z0000 Encounter for general adult medical examination without abnormal findings: Secondary | ICD-10-CM | POA: Diagnosis not present

## 2021-03-13 ENCOUNTER — Ambulatory Visit: Payer: PPO | Attending: Anesthesiology | Admitting: Anesthesiology

## 2021-03-13 ENCOUNTER — Encounter: Payer: Self-pay | Admitting: Anesthesiology

## 2021-03-13 DIAGNOSIS — F1190 Opioid use, unspecified, uncomplicated: Secondary | ICD-10-CM | POA: Diagnosis not present

## 2021-03-13 DIAGNOSIS — G8929 Other chronic pain: Secondary | ICD-10-CM | POA: Diagnosis not present

## 2021-03-13 DIAGNOSIS — G894 Chronic pain syndrome: Secondary | ICD-10-CM | POA: Diagnosis not present

## 2021-03-13 DIAGNOSIS — M5134 Other intervertebral disc degeneration, thoracic region: Secondary | ICD-10-CM | POA: Insufficient documentation

## 2021-03-13 DIAGNOSIS — M5416 Radiculopathy, lumbar region: Secondary | ICD-10-CM | POA: Diagnosis not present

## 2021-03-13 DIAGNOSIS — B0229 Other postherpetic nervous system involvement: Secondary | ICD-10-CM | POA: Insufficient documentation

## 2021-03-13 DIAGNOSIS — M5136 Other intervertebral disc degeneration, lumbar region: Secondary | ICD-10-CM | POA: Diagnosis not present

## 2021-03-13 DIAGNOSIS — M546 Pain in thoracic spine: Secondary | ICD-10-CM | POA: Diagnosis not present

## 2021-03-14 ENCOUNTER — Telehealth: Payer: Self-pay | Admitting: Anesthesiology

## 2021-03-14 ENCOUNTER — Encounter: Payer: Self-pay | Admitting: Anesthesiology

## 2021-03-14 MED ORDER — HYDROCODONE-ACETAMINOPHEN 7.5-325 MG PO TABS: 1.0000 | ORAL_TABLET | Freq: Four times a day (QID) | ORAL | 0 refills | Status: AC | PRN

## 2021-03-14 NOTE — Progress Notes (Signed)
Virtual Visit via Telephone Note ? ?I connected with Timothy Knapp on 03/14/21 at  2:30 PM EST by telephone and verified that I am speaking with the correct person using two identifiers. ? ?Location: ?Patient: Home ?Provider: Pain control center ?  ?I discussed the limitations, risks, security and privacy concerns of performing an evaluation and management service by telephone and the availability of in person appointments. I also discussed with the patient that there may be a patient responsible charge related to this service. The patient expressed understanding and agreed to proceed. ? ? ?History of Present Illness: ?I spoke with Timothy Knapp today via telephone as we were unable link for the video portion of the conference.  He states that he is doing much better on the 7.5 mg hydrocodone twice a day.  He is got better control of pain throughout the day and less times when the pain is considered severe.  No side effects with the medication are reported.  Otherwise he is in his usual state of health and the quality characteristic and distribution of the pain are also stable. ? ?Review of systems: ?General: No fevers or chills ?Pulmonary: No shortness of breath or dyspnea ?Cardiac: No angina or palpitations or lightheadedness ?GI: No abdominal pain or constipation ?Psych: No depression  ?  ?Observations/Objective: ? ?Current Outpatient Medications:  ?  [START ON 04/22/2021] HYDROcodone-acetaminophen (NORCO) 7.5-325 MG tablet, Take 1 tablet by mouth every 6 (six) hours as needed for moderate pain or severe pain., Disp: 60 tablet, Rfl: 0 ?  atorvastatin (LIPITOR) 20 MG tablet, Take 20 mg by mouth daily., Disp: , Rfl:  ?  benazepril (LOTENSIN) 20 MG tablet, TAKE 1 TABLET(20 MG) BY MOUTH DAILY, Disp: 30 tablet, Rfl: 0 ?  Cholecalciferol (VITAMIN D3 PO), Take by mouth., Disp: , Rfl:  ?  Cyanocobalamin (B-12 PO), Take 1 Dose by mouth daily., Disp: , Rfl:  ?  dutasteride (AVODART) 0.5 MG capsule, Take 0.5 mg by mouth daily.,  Disp: , Rfl:  ?  [START ON 03/22/2021] HYDROcodone-acetaminophen (NORCO) 7.5-325 MG tablet, Take 1 tablet by mouth every 6 (six) hours as needed for moderate pain or severe pain., Disp: 60 tablet, Rfl: 0 ?  meloxicam (MOBIC) 7.5 MG tablet, TAKE 1 TABLET(7.5 MG) BY MOUTH EVERY DAY, Disp: , Rfl:  ?  Omega-3 Fatty Acids (OMEGA-3 PO), Take 788 mg by mouth daily., Disp: , Rfl:  ?  vitamin C (ASCORBIC ACID) 500 MG tablet, Take 500 mg by mouth daily., Disp: , Rfl:  ?  zinc gluconate 50 MG tablet, Take 1 tablet by mouth daily., Disp: , Rfl:   ? ?Past Medical History:  ?Diagnosis Date  ? Arthritis   ? BPH (benign prostatic hypertrophy)   ? DDD (degenerative disc disease), lumbar 02/26/2020  ? Hyperlipidemia   ? Hypertension   ?  ? ?Assessment and Plan: ? ?1. Chronic right-sided thoracic back pain   ?2. DDD (degenerative disc disease), thoracic   ?3. Chronic pain syndrome   ?4. Chronic, continuous use of opioids   ?5. Postherpetic neuralgia   ?6. DDD (degenerative disc disease), lumbar   ?7. Chronic radicular pain of lower back   ?I am very pleased that his pain is under better control with this new change in regimen.  No problems with the medicine are noted.  He has been quite compliant and is getting good relief.  Unfortunately he has failed more conservative therapy but this regimen has been effective for him.  No side effects are  reported and we will refill his medicines for the next 2 months dated for March 15 and April 16.  I have reviewed the [facility] practitioner database information and it is appropriate.  He is to continue follow-up with his primary care physicians for baseline medical care. ?Follow Up Instructions: ? ?  ?I discussed the assessment and treatment plan with the patient. The patient was provided an opportunity to ask questions and all were answered. The patient agreed with the plan and demonstrated an understanding of the instructions. ?  ?The patient was advised to call back or seek an in-person  evaluation if the symptoms worsen or if the condition fails to improve as anticipated. ? ?I provided 30 minutes of non-face-to-face time during this encounter. ? ? ?[name], MD  ?

## 2021-03-14 NOTE — Telephone Encounter (Signed)
Dr Andree Elk notified.  ?

## 2021-04-25 DIAGNOSIS — S39012A Strain of muscle, fascia and tendon of lower back, initial encounter: Secondary | ICD-10-CM | POA: Diagnosis not present

## 2021-05-03 ENCOUNTER — Encounter: Payer: Self-pay | Admitting: Anesthesiology

## 2021-05-03 ENCOUNTER — Ambulatory Visit: Payer: PPO | Attending: Anesthesiology | Admitting: Anesthesiology

## 2021-05-03 VITALS — BP 141/69 | HR 63 | Temp 97.5°F | Resp 16 | Ht 68.0 in | Wt 153.0 lb

## 2021-05-03 DIAGNOSIS — M546 Pain in thoracic spine: Secondary | ICD-10-CM | POA: Insufficient documentation

## 2021-05-03 DIAGNOSIS — G894 Chronic pain syndrome: Secondary | ICD-10-CM | POA: Diagnosis not present

## 2021-05-03 DIAGNOSIS — B0229 Other postherpetic nervous system involvement: Secondary | ICD-10-CM | POA: Diagnosis not present

## 2021-05-03 DIAGNOSIS — M5416 Radiculopathy, lumbar region: Secondary | ICD-10-CM | POA: Diagnosis not present

## 2021-05-03 DIAGNOSIS — G8929 Other chronic pain: Secondary | ICD-10-CM | POA: Diagnosis not present

## 2021-05-03 DIAGNOSIS — M5136 Other intervertebral disc degeneration, lumbar region: Secondary | ICD-10-CM | POA: Diagnosis not present

## 2021-05-03 DIAGNOSIS — F1190 Opioid use, unspecified, uncomplicated: Secondary | ICD-10-CM | POA: Insufficient documentation

## 2021-05-03 DIAGNOSIS — M5134 Other intervertebral disc degeneration, thoracic region: Secondary | ICD-10-CM

## 2021-05-03 MED ORDER — BACLOFEN 10 MG PO TABS: 10.0000 mg | ORAL_TABLET | Freq: Every day | ORAL | 3 refills | Status: AC

## 2021-05-03 MED ORDER — HYDROCODONE-ACETAMINOPHEN 7.5-325 MG PO TABS: 1.0000 | ORAL_TABLET | Freq: Four times a day (QID) | ORAL | 0 refills | Status: DC | PRN

## 2021-05-03 MED ORDER — MELOXICAM 7.5 MG PO TABS: 7.5000 mg | ORAL_TABLET | Freq: Two times a day (BID) | ORAL | 3 refills | Status: AC

## 2021-05-03 NOTE — Progress Notes (Signed)
Nursing Pain Medication Assessment:  ?Safety precautions to be maintained throughout the outpatient stay will include: orient to surroundings, keep bed in low position, maintain call bell within reach at all times, provide assistance with transfer out of bed and ambulation.  ?Medication Inspection Compliance: Timothy Knapp did not comply with our request to bring his pills to be counted. He was reminded that bringing the medication bottles, even when empty, is a requirement. ? ?Medication: None brought in. ?Pill/Patch Count: None available to be counted. ?Bottle Appearance: No container available. Did not bring bottle(s) to appointment. ?Filled Date: N/A ?Last Medication intake:  Day before yesterday ?

## 2021-05-07 NOTE — Progress Notes (Signed)
? ?Subjective:  ?Patient ID: Timothy Knapp, male    DOB: [birth year]  Age: 81 y.o. MRN: [id number] ? ?CC: Back Pain (Right, lower) ? ? ?Procedure: None ? ?HPI ?Timothy Knapp presents for reevaluation.  He continues to have intermittent thoracic pain and occasional low back pain that has been problematic and activity dependent.  The pain has been stable in nature affecting the low back and occasionally radiates into the lower extremities.  He has done some home therapy with stretching exercises but this pain has persisted.  In the past he has taken anti-inflammatories and muscle relaxants and recently had an acute exacerbation requiring some oral steroids and institution of baclofen to help with his muscle spasms in the low back.  He occasionally gets radiation and sciatica symptoms affecting the lower legs.  No change in bowel or bladder function has been noted and this pain has improved.  It is still problematic and episodic in nature.  He takes meloxicam and baclofen to help with the muscle spasm and pain and this generally works well.  He also is taking his opioid medications for his chronic thoracic postherpetic neuralgia and this also is helping with the back pain.  He limits this to approximately 2tablets/week on average.  This works well for him.  No change in the quality characteristic or distribution of this is noted. ? ?Outpatient Medications Prior to Visit  ?Medication Sig Dispense Refill  ? atorvastatin (LIPITOR) 20 MG tablet Take 20 mg by mouth daily.    ? benazepril (LOTENSIN) 20 MG tablet TAKE 1 TABLET(20 MG) BY MOUTH DAILY 30 tablet 0  ? Cholecalciferol (VITAMIN D3 PO) Take by mouth.    ? Cyanocobalamin (B-12 PO) Take 1 Dose by mouth daily.    ? dutasteride (AVODART) 0.5 MG capsule Take 0.5 mg by mouth daily.    ? HYDROcodone-acetaminophen (NORCO) 7.5-325 MG tablet Take 1 tablet by mouth every 6 (six) hours as needed for moderate pain or severe pain. 60 tablet 0  ? Omega-3 Fatty Acids (OMEGA-3 PO)  Take 788 mg by mouth daily.    ? predniSONE (STERAPRED UNI-PAK 21 TAB) 10 MG (21) TBPK tablet 6 day taper. Take as directed with food    ? Turmeric 500 MG CAPS Take 500 mg by mouth in the morning and at bedtime.    ? vitamin C (ASCORBIC ACID) 500 MG tablet Take 500 mg by mouth daily.    ? zinc gluconate 50 MG tablet Take 1 tablet by mouth daily.    ? meloxicam (MOBIC) 7.5 MG tablet TAKE 1 TABLET(7.5 MG) BY MOUTH EVERY DAY    ? ?No facility-administered medications prior to visit.  ? ? ?Review of Systems ?CNS: No confusion or sedation ?Cardiac: No angina or palpitations ?GI: No abdominal pain or constipation ?Constitutional: No nausea vomiting fevers or chills ? ?Objective:  ?BP (!) 141/69   Pulse 63   Temp (!) 97.5 ?F (36.4 ?C) (Temporal)   Resp 16   Ht '5\' 8"'$  (1.727 m)   Wt 153 lb (69.4 kg)   SpO2 97%   BMI 23.26 kg/m?  ? ? ?BP Readings from Last 3 Encounters:  ?05/03/21 (!) 141/69  ?02/19/20 121/67  ?08/12/19 128/75  ? ? ? ?Wt Readings from Last 3 Encounters:  ?05/03/21 153 lb (69.4 kg)  ?02/19/20 160 lb (72.6 kg)  ?08/12/19 160 lb (72.6 kg)  ? ? ? ?Physical Exam ?Pt is alert and oriented ?PERRL EOMI ?HEART IS RRR no murmur or rub ?LCTA  no wheezing or rales ?MUSCULOSKELETAL reveals some paraspinous muscle tenderness in the lumbar paraspinous musculature.  He has some mild pain with extension at the low back with left and right lateral rotation.  His strength in the lower extremities appears to be well preserved as is muscle tone and bulk.  He ambulates with a slightly antalgic gait but otherwise is moving about well. ? ?Labs ? ?No results found for: HGBA1C ?Lab Results  ?Component Value Date  ? LDLCALC 101 (H) 02/05/2018  ? CREATININE 1.10 04/07/2019  ? ? ?-------------------------------------------------------------------------------------------------------------------- ?Lab Results  ?Component Value Date  ? WBC 6.3 02/05/2018  ? HGB 15.7 02/05/2018  ? HCT 47.6 02/05/2018  ? PLT 188 02/05/2018  ? GLUCOSE 83  08/12/2018  ? CHOL 179 08/12/2018  ? TRIG 110 08/12/2018  ? HDL 49 02/05/2018  ? LDLCALC 101 (H) 02/05/2018  ? ALT 27 08/12/2018  ? AST 41 (H) 08/12/2018  ? NA 143 08/12/2018  ? K 5.0 08/12/2018  ? CL 107 (H) 08/12/2018  ? CREATININE 1.10 04/07/2019  ? BUN 26 08/12/2018  ? CO2 19 (L) 08/12/2018  ? TSH 2.190 02/05/2018  ? INR 0.95 11/28/2016  ? ? ?--------------------------------------------------------------------------------------------------------------------- ?No results found. ? ? ?Assessment & Plan:  ? ?Timothy Knapp was seen today for back pain. ? ?Diagnoses and all orders for this visit: ? ?Chronic right-sided thoracic back pain ?-     ToxASSURE Select 13 (MW), Urine ? ?DDD (degenerative disc disease), thoracic ?-     Ambulatory referral to Physical Therapy ?-     ToxASSURE Select 13 (MW), Urine ? ?Chronic pain syndrome ?-     ToxASSURE Select 13 (MW), Urine ? ?Chronic, continuous use of opioids ?-     ToxASSURE Select 13 (MW), Urine ? ?Postherpetic neuralgia ?-     ToxASSURE Select 13 (MW), Urine ? ?DDD (degenerative disc disease), lumbar ?-     Ambulatory referral to Physical Therapy ?-     ToxASSURE Select 13 (MW), Urine ? ?Chronic radicular pain of lower back ?-     Ambulatory referral to Physical Therapy ?-     ToxASSURE Select 13 (MW), Urine ? ?Other orders ?-     HYDROcodone-acetaminophen (NORCO) 7.5-325 MG tablet; Take 1 tablet by mouth every 6 (six) hours as needed for moderate pain or severe pain. ?-     meloxicam (MOBIC) 7.5 MG tablet; Take 1 tablet (7.5 mg total) by mouth 2 (two) times daily. ?-     baclofen (LIORESAL) 10 MG tablet; Take 1 tablet (10 mg total) by mouth daily. ? ? ?   ? ? ?---------------------------------------------------------------------------------------------------------------------- ? ?Problem List Items Addressed This Visit   ? ?  ? Unprioritized  ? Back pain - Primary  ? Relevant Medications  ? predniSONE (STERAPRED UNI-PAK 21 TAB) 10 MG (21) TBPK tablet  ?  HYDROcodone-acetaminophen (NORCO) 7.5-325 MG tablet (Start on 05/23/2021)  ? meloxicam (MOBIC) 7.5 MG tablet  ? baclofen (LIORESAL) 10 MG tablet  ? Other Relevant Orders  ? ToxASSURE Select 13 (MW), Urine  ? Chronic radicular pain of lower back  ? Relevant Medications  ? predniSONE (STERAPRED UNI-PAK 21 TAB) 10 MG (21) TBPK tablet  ? HYDROcodone-acetaminophen (NORCO) 7.5-325 MG tablet (Start on 05/23/2021)  ? meloxicam (MOBIC) 7.5 MG tablet  ? baclofen (LIORESAL) 10 MG tablet  ? Other Relevant Orders  ? Ambulatory referral to Physical Therapy  ? ToxASSURE Select 13 (MW), Urine  ? DDD (degenerative disc disease), lumbar  ? Relevant Medications  ? predniSONE (  STERAPRED UNI-PAK 21 TAB) 10 MG (21) TBPK tablet  ? HYDROcodone-acetaminophen (NORCO) 7.5-325 MG tablet (Start on 05/23/2021)  ? meloxicam (MOBIC) 7.5 MG tablet  ? baclofen (LIORESAL) 10 MG tablet  ? Other Relevant Orders  ? Ambulatory referral to Physical Therapy  ? ToxASSURE Select 13 (MW), Urine  ? DDD (degenerative disc disease), thoracic  ? Relevant Medications  ? predniSONE (STERAPRED UNI-PAK 21 TAB) 10 MG (21) TBPK tablet  ? HYDROcodone-acetaminophen (NORCO) 7.5-325 MG tablet (Start on 05/23/2021)  ? meloxicam (MOBIC) 7.5 MG tablet  ? baclofen (LIORESAL) 10 MG tablet  ? Other Relevant Orders  ? Ambulatory referral to Physical Therapy  ? ToxASSURE Select 13 (MW), Urine  ? Postherpetic neuralgia  ? Relevant Orders  ? ToxASSURE Select 13 (MW), Urine  ? ?Other Visit Diagnoses   ? ? Chronic pain syndrome      ? Relevant Medications  ? predniSONE (STERAPRED UNI-PAK 21 TAB) 10 MG (21) TBPK tablet  ? HYDROcodone-acetaminophen (NORCO) 7.5-325 MG tablet (Start on 05/23/2021)  ? meloxicam (MOBIC) 7.5 MG tablet  ? baclofen (LIORESAL) 10 MG tablet  ? Other Relevant Orders  ? ToxASSURE Select 13 (MW), Urine  ? Chronic, continuous use of opioids      ? Relevant Orders  ? ToxASSURE Select 13 (MW), Urine  ? ?   ? ? ? ? ?---------------------------------------------------------------------------------------------------------------------- ? ?1. Chronic right-sided thoracic back pain ?Continue with his current medication management.  I have reviewed the [facility] practitioner database information and i

## 2021-05-09 LAB — TOXASSURE SELECT 13 (MW), URINE

## 2021-05-30 ENCOUNTER — Telehealth: Payer: Self-pay | Admitting: Anesthesiology

## 2021-05-30 NOTE — Telephone Encounter (Signed)
Please call patient regarding his scripts, he is scheduled for 06-07-21 and says he will be out of his Hydrocodone before that. It looks like he has another script to fill. He says he last filled a script on 05-03-21 Please call patient

## 2021-05-30 NOTE — Telephone Encounter (Signed)
Called pharmacy and no one answers after multiple rings. Called back and left VM for them to call me back to discuss.

## 2021-06-07 ENCOUNTER — Encounter: Payer: Self-pay | Admitting: Anesthesiology

## 2021-06-07 ENCOUNTER — Ambulatory Visit: Payer: PPO | Attending: Anesthesiology | Admitting: Anesthesiology

## 2021-06-07 DIAGNOSIS — M51369 Other intervertebral disc degeneration, lumbar region without mention of lumbar back pain or lower extremity pain: Secondary | ICD-10-CM

## 2021-06-07 DIAGNOSIS — M546 Pain in thoracic spine: Secondary | ICD-10-CM | POA: Diagnosis not present

## 2021-06-07 DIAGNOSIS — M5136 Other intervertebral disc degeneration, lumbar region: Secondary | ICD-10-CM

## 2021-06-07 DIAGNOSIS — G894 Chronic pain syndrome: Secondary | ICD-10-CM | POA: Diagnosis not present

## 2021-06-07 DIAGNOSIS — M5416 Radiculopathy, lumbar region: Secondary | ICD-10-CM | POA: Diagnosis not present

## 2021-06-07 DIAGNOSIS — M5134 Other intervertebral disc degeneration, thoracic region: Secondary | ICD-10-CM | POA: Diagnosis not present

## 2021-06-07 DIAGNOSIS — B0229 Other postherpetic nervous system involvement: Secondary | ICD-10-CM

## 2021-06-07 DIAGNOSIS — G8929 Other chronic pain: Secondary | ICD-10-CM

## 2021-06-07 DIAGNOSIS — F1190 Opioid use, unspecified, uncomplicated: Secondary | ICD-10-CM | POA: Diagnosis not present

## 2021-06-07 MED ORDER — HYDROCODONE-ACETAMINOPHEN 7.5-325 MG PO TABS
1.0000 | ORAL_TABLET | Freq: Four times a day (QID) | ORAL | 0 refills | Status: DC | PRN
Start: 2021-07-29 — End: 2021-08-15

## 2021-06-07 MED ORDER — HYDROCODONE-ACETAMINOPHEN 7.5-325 MG PO TABS: 1.0000 | ORAL_TABLET | Freq: Four times a day (QID) | ORAL | 0 refills | Status: AC | PRN

## 2021-06-07 MED ORDER — BACLOFEN 10 MG PO TABS: 10.0000 mg | ORAL_TABLET | Freq: Every day | ORAL | 5 refills | Status: AC

## 2021-06-07 MED ORDER — MELOXICAM 7.5 MG PO TABS: 7.5000 mg | ORAL_TABLET | Freq: Two times a day (BID) | ORAL | 5 refills | Status: AC | PRN

## 2021-06-07 NOTE — Progress Notes (Signed)
Virtual Visit via Telephone Note  I connected with Timothy Knapp on 06/07/21 at  3:30 PM EDT by telephone and verified that I am speaking with the correct person using two identifiers.  Location: Patient: Home Provider: Pain control center iI discussed the limitations, risks, security and privacy concerns of performing an evaluation and management service by telephone and the availability of in person appointments. I also discussed with the patient that there may be a patient responsible charge related to this service. The patient expressed understanding and agreed to proceed.   History of Present Illness: I was able to reach Timothy Knapp via telephone.  We were not able to connect for the video portion of the conference but he reports that he has been doing well.  The recent change to the 7.5 mg hydrocodone twice a day is working very well for him and keeping his pain under control.  The quality characteristic and distribution of his pain is stable.  No other changes are reported at this time.  He reports that he continues to derive good functional lifestyle improvement with the medication and no overt side effects are noted.  He generally gets about 75 to 90% pain relief with the medication previously reported.  Review of systems: General: No fevers or chills Pulmonary: No shortness of breath or dyspnea Cardiac: No angina or palpitations or lightheadedness GI: No abdominal pain or constipation Psych: No depression    Observations/Objective:  Current Outpatient Medications:    baclofen (LIORESAL) 10 MG tablet, Take 1 tablet (10 mg total) by mouth daily., Disp: 30 tablet, Rfl: 5   [START ON 07/29/2021] HYDROcodone-acetaminophen (NORCO) 7.5-325 MG tablet, Take 1 tablet by mouth every 6 (six) hours as needed for moderate pain or severe pain., Disp: 60 tablet, Rfl: 0   meloxicam (MOBIC) 7.5 MG tablet, Take 1 tablet (7.5 mg total) by mouth 2 (two) times daily as needed for pain., Disp: 60 tablet,  Rfl: 5   atorvastatin (LIPITOR) 20 MG tablet, Take 20 mg by mouth daily., Disp: , Rfl:    benazepril (LOTENSIN) 20 MG tablet, TAKE 1 TABLET(20 MG) BY MOUTH DAILY, Disp: 30 tablet, Rfl: 0   Cholecalciferol (VITAMIN D3 PO), Take by mouth., Disp: , Rfl:    Cyanocobalamin (B-12 PO), Take 1 Dose by mouth daily., Disp: , Rfl:    dutasteride (AVODART) 0.5 MG capsule, Take 0.5 mg by mouth daily., Disp: , Rfl:    [START ON 06/29/2021] HYDROcodone-acetaminophen (NORCO) 7.5-325 MG tablet, Take 1 tablet by mouth every 6 (six) hours as needed for moderate pain or severe pain., Disp: 60 tablet, Rfl: 0   Omega-3 Fatty Acids (OMEGA-3 PO), Take 788 mg by mouth daily., Disp: , Rfl:    predniSONE (STERAPRED UNI-PAK 21 TAB) 10 MG (21) TBPK tablet, 6 day taper. Take as directed with food, Disp: , Rfl:    Turmeric 500 MG CAPS, Take 500 mg by mouth in the morning and at bedtime., Disp: , Rfl:    vitamin C (ASCORBIC ACID) 500 MG tablet, Take 500 mg by mouth daily., Disp: , Rfl:    zinc gluconate 50 MG tablet, Take 1 tablet by mouth daily., Disp: , Rfl:    Past Medical History:  Diagnosis Date   Arthritis    BPH (benign prostatic hypertrophy)    DDD (degenerative disc disease), lumbar 02/26/2020   Hyperlipidemia    Hypertension      Assessment and Plan: 1. Chronic right-sided thoracic back pain   2. DDD (degenerative disc  disease), thoracic   3. Chronic pain syndrome   4. Chronic, continuous use of opioids   5. Postherpetic neuralgia   6. DDD (degenerative disc disease), lumbar   7. Chronic radicular pain of lower back   Based on our discussion today I think it is appropriate to refill his medicines and this will be done for the next 2 months dated for June 22 and July 22.  I have reviewed the [facility] practitioner database information and it is appropriate.  Furthermore we will keep him on the meloxicam he is taking this up to twice a day and baclofen once a day to help with lower back spasms.  No other  changes in his pharmacologic regimen will be initiated.  We will schedule him for 65-monthreturn to clinic and have encouraged him to continue follow-up with his primary care physicians for baseline medical care.  Follow Up Instructions:    I discussed the assessment and treatment plan with the patient. The patient was provided an opportunity to ask questions and all were answered. The patient agreed with the plan and demonstrated an understanding of the instructions.   The patient was advised to call back or seek an in-person evaluation if the symptoms worsen or if the condition fails to improve as anticipated.  I provided 30 minutes of non-face-to-face time during this encounter.   JMolli Barrows MD

## 2021-07-03 ENCOUNTER — Telehealth: Payer: PPO | Admitting: Anesthesiology

## 2021-07-04 ENCOUNTER — Telehealth: Payer: Self-pay

## 2021-07-04 NOTE — Telephone Encounter (Signed)
Spoke with [facility] pharmacy and they do have the Rx for hydro- apap 7.5 - 325 mg ready to fill.  They are going to get it ready and let him know.

## 2021-07-19 ENCOUNTER — Other Ambulatory Visit: Payer: Self-pay | Admitting: Anesthesiology

## 2021-07-20 ENCOUNTER — Encounter: Payer: Self-pay | Admitting: Anesthesiology

## 2021-08-15 ENCOUNTER — Encounter: Payer: Self-pay | Admitting: Anesthesiology

## 2021-08-15 ENCOUNTER — Ambulatory Visit: Payer: PPO | Attending: Anesthesiology | Admitting: Anesthesiology

## 2021-08-15 DIAGNOSIS — M5416 Radiculopathy, lumbar region: Secondary | ICD-10-CM

## 2021-08-15 DIAGNOSIS — M546 Pain in thoracic spine: Secondary | ICD-10-CM | POA: Diagnosis not present

## 2021-08-15 DIAGNOSIS — M5136 Other intervertebral disc degeneration, lumbar region: Secondary | ICD-10-CM | POA: Diagnosis not present

## 2021-08-15 DIAGNOSIS — B0229 Other postherpetic nervous system involvement: Secondary | ICD-10-CM | POA: Diagnosis not present

## 2021-08-15 DIAGNOSIS — G894 Chronic pain syndrome: Secondary | ICD-10-CM

## 2021-08-15 DIAGNOSIS — M5134 Other intervertebral disc degeneration, thoracic region: Secondary | ICD-10-CM | POA: Diagnosis not present

## 2021-08-15 DIAGNOSIS — F1190 Opioid use, unspecified, uncomplicated: Secondary | ICD-10-CM

## 2021-08-15 DIAGNOSIS — G8929 Other chronic pain: Secondary | ICD-10-CM | POA: Diagnosis not present

## 2021-08-15 MED ORDER — HYDROCODONE-ACETAMINOPHEN 7.5-325 MG PO TABS: 1.0000 | ORAL_TABLET | Freq: Four times a day (QID) | ORAL | 0 refills | Status: AC | PRN

## 2021-08-15 MED ORDER — HYDROCODONE-ACETAMINOPHEN 7.5-325 MG PO TABS: 1.0000 | ORAL_TABLET | Freq: Four times a day (QID) | ORAL | 0 refills | Status: DC | PRN

## 2021-08-15 NOTE — Progress Notes (Signed)
Virtual Visit via Telephone Note  I connected with Timothy Knapp on 08/15/21 at  3:15 PM EDT by telephone and verified that I am speaking with the correct person using two identifiers.  Location: Patient: Home Provider: Pain control center   I discussed the limitations, risks, security and privacy concerns of performing an evaluation and management service by telephone and the availability of in person appointments. I also discussed with the patient that there may be a patient responsible charge related to this service. The patient expressed understanding and agreed to proceed.   History of Present Illness: I spoke to Timothy Knapp today via telephone as we are unable like for the video portion of the conference and he states that he is doing well with his trigeminal neuralgia and facial pain.  The quality characteristic and distribution of this remained stable with no recent changes reported.  Furthermore his low back pain is also doing well.  He staying active and taking his medications as prescribed generally averaging about 1 tablet twice a day.  He is using the 7.5 mg strength and generally gets about 50 to 75% relief or better for about 6 to 8 hours following the usage of the medicine.  Otherwise he is in his usual state of health.  No side effects of the medications are reported.  Review of systems: General: No fevers or chills Pulmonary: No shortness of breath or dyspnea Cardiac: No angina or palpitations or lightheadedness GI: No abdominal pain or constipation Psych: No depression    Observations/Objective:  Current Outpatient Medications:    [START ON 09/28/2021] HYDROcodone-acetaminophen (NORCO) 7.5-325 MG tablet, Take 1 tablet by mouth every 6 (six) hours as needed for moderate pain or severe pain., Disp: 60 tablet, Rfl: 0   atorvastatin (LIPITOR) 20 MG tablet, Take 20 mg by mouth daily., Disp: , Rfl:    benazepril (LOTENSIN) 20 MG tablet, TAKE 1 TABLET(20 MG) BY MOUTH DAILY, Disp:  30 tablet, Rfl: 0   Cholecalciferol (VITAMIN D3 PO), Take by mouth., Disp: , Rfl:    Cyanocobalamin (B-12 PO), Take 1 Dose by mouth daily., Disp: , Rfl:    dutasteride (AVODART) 0.5 MG capsule, Take 0.5 mg by mouth daily., Disp: , Rfl:    [START ON 08/29/2021] HYDROcodone-acetaminophen (NORCO) 7.5-325 MG tablet, Take 1 tablet by mouth every 6 (six) hours as needed for moderate pain or severe pain., Disp: 60 tablet, Rfl: 0   Omega-3 Fatty Acids (OMEGA-3 PO), Take 788 mg by mouth daily., Disp: , Rfl:    predniSONE (STERAPRED UNI-PAK 21 TAB) 10 MG (21) TBPK tablet, 6 day taper. Take as directed with food, Disp: , Rfl:    Turmeric 500 MG CAPS, Take 500 mg by mouth in the morning and at bedtime., Disp: , Rfl:    vitamin C (ASCORBIC ACID) 500 MG tablet, Take 500 mg by mouth daily., Disp: , Rfl:    zinc gluconate 50 MG tablet, Take 1 tablet by mouth daily., Disp: , Rfl:    Past Medical History:  Diagnosis Date   Arthritis    BPH (benign prostatic hypertrophy)    DDD (degenerative disc disease), lumbar 02/26/2020   Hyperlipidemia    Hypertension      Assessment and Plan: 1. Chronic right-sided thoracic back pain   2. DDD (degenerative disc disease), thoracic   3. Chronic pain syndrome   4. Chronic, continuous use of opioids   5. Postherpetic neuralgia   6. DDD (degenerative disc disease), lumbar   7. Chronic  radicular pain of lower back   Based on our discussion today I think is appropriate to refill his medications for the next 2 months dated 11 August 2020 and September 21.  No other changes in his medication management will be initiated today.  I want him to continue to be active and continue with stretching strengthening for his low back and core.  Continue follow-up with his primary care physicians for baseline medical care with return to clinic scheduled in 2 months.  Follow Up Instructions:    I discussed the assessment and treatment plan with the patient. The patient was provided an  opportunity to ask questions and all were answered. The patient agreed with the plan and demonstrated an understanding of the instructions.   The patient was advised to call back or seek an in-person evaluation if the symptoms worsen or if the condition fails to improve as anticipated.  I provided 30 minutes of non-face-to-face time during this encounter.   [name], MD

## 2021-08-27 ENCOUNTER — Encounter: Payer: Self-pay | Admitting: Anesthesiology

## 2021-08-31 ENCOUNTER — Telehealth: Payer: Self-pay | Admitting: Anesthesiology

## 2021-08-31 DIAGNOSIS — H35371 Puckering of macula, right eye: Secondary | ICD-10-CM | POA: Diagnosis not present

## 2021-08-31 DIAGNOSIS — H2513 Age-related nuclear cataract, bilateral: Secondary | ICD-10-CM | POA: Diagnosis not present

## 2021-08-31 NOTE — Telephone Encounter (Signed)
Called pharmacy to check on Rx's.  They do have one for August and September.  They are going to get the August Rx ready and he can pick that up today after 3.    Voicemail left with patient that they do have scripts and when he will be able to pick up.

## 2021-08-31 NOTE — Telephone Encounter (Signed)
Patient is calling about Hydrocodone. Says his meds ran out yesterday. Pharmacy is telling him he has not scripts there. Please call his pharmacy and let patient know status

## 2021-09-06 ENCOUNTER — Encounter: Payer: Self-pay | Admitting: Dermatology

## 2021-09-06 ENCOUNTER — Ambulatory Visit: Payer: PPO | Admitting: Dermatology

## 2021-09-06 DIAGNOSIS — D1800 Hemangioma unspecified site: Secondary | ICD-10-CM

## 2021-09-06 DIAGNOSIS — L570 Actinic keratosis: Secondary | ICD-10-CM

## 2021-09-06 DIAGNOSIS — L578 Other skin changes due to chronic exposure to nonionizing radiation: Secondary | ICD-10-CM

## 2021-09-06 DIAGNOSIS — L814 Other melanin hyperpigmentation: Secondary | ICD-10-CM | POA: Diagnosis not present

## 2021-09-06 DIAGNOSIS — D229 Melanocytic nevi, unspecified: Secondary | ICD-10-CM | POA: Diagnosis not present

## 2021-09-06 DIAGNOSIS — Z85828 Personal history of other malignant neoplasm of skin: Secondary | ICD-10-CM | POA: Diagnosis not present

## 2021-09-06 DIAGNOSIS — Z1283 Encounter for screening for malignant neoplasm of skin: Secondary | ICD-10-CM

## 2021-09-06 DIAGNOSIS — L820 Inflamed seborrheic keratosis: Secondary | ICD-10-CM | POA: Diagnosis not present

## 2021-09-06 DIAGNOSIS — L821 Other seborrheic keratosis: Secondary | ICD-10-CM | POA: Diagnosis not present

## 2021-09-06 NOTE — Progress Notes (Signed)
New Patient Visit  Subjective  Timothy Knapp is a 81 y.o. male who presents for the following: Upper body skin exam (Hx of BCC L chest ~ 66yr [facility]x of skin ca) and check spots (Back, face, >631mitchy). The patient presents for Upper Body Skin Exam (UBSE) for skin cancer screening and mole check.  The patient has spots, moles and lesions to be evaluated, some may be new or changing and the patient has concerns that these could be cancer.   The following portions of the chart were reviewed this encounter and updated as appropriate:   Tobacco  Allergies  Meds  Problems  Med Hx  Surg Hx  Fam Hx     Review of Systems:  No other skin or systemic complaints except as noted in HPI or Assessment and Plan.  Objective  Well appearing patient in no apparent distress; mood and affect are within normal limits.  All skin waist up examined.  face x 7 (7) Pink scaly macules  face x 3, back x 15, L arm x 1 (19) Stuck on waxy paps with erythema   Assessment & Plan   Lentigines - Scattered tan macules - Due to sun exposure - Benign-appearing, observe - Recommend daily broad spectrum sunscreen SPF 30+ to sun-exposed areas, reapply every 2 hours as needed. - Call for any changes  Seborrheic Keratoses - Stuck-on, waxy, tan-brown papules and/or plaques  - Benign-appearing - Discussed benign etiology and prognosis. - Observe - Call for any changes - face, trunk  Melanocytic Nevi - Tan-brown and/or pink-flesh-colored symmetric macules and papules - Benign appearing on exam today - Observation - Call clinic for new or changing moles - Recommend daily use of broad spectrum spf 30+ sunscreen to sun-exposed areas.   Hemangiomas - Red papules - Discussed benign nature - Observe - Call for any changes - trunk  Actinic Damage - Chronic condition, secondary to cumulative UV/sun exposure - diffuse scaly erythematous macules with underlying  dyspigmentation - Recommend daily broad spectrum sunscreen SPF 30+ to sun-exposed areas, reapply every 2 hours as needed.  - Staying in the shade or wearing long sleeves, sun glasses (UVA+UVB protection) and wide brim hats (4-inch brim around the entire circumference of the hat) are also recommended for sun protection.  - Call for new or changing lesions.  Skin cancer screening performed today.   History of Basal Cell Carcinoma of the Skin - No evidence of recurrence today - Recommend regular full body skin exams - Recommend daily broad spectrum sunscreen SPF 30+ to sun-exposed areas, reapply every 2 hours as needed.  - Call if any new or changing lesions are noted between office visits  - L chest  AK (actinic keratosis) (7) face x 7  Destruction of lesion - face x 7 Complexity: simple   Destruction method: cryotherapy   Informed consent: discussed and consent obtained   Timeout:  patient name, date of birth, surgical site, and procedure verified Lesion destroyed using liquid nitrogen: Yes   Region frozen until ice ball extended beyond lesion: Yes   Outcome: patient tolerated procedure well with no complications   Post-procedure details: wound care instructions given    Inflamed seborrheic keratosis (19) face x 3, back x 15, L arm x 1  Symptomatic, irritating, patient would like treated.   Destruction of lesion - face x 3, back x 15, L arm x 1 Complexity: simple   Destruction method: cryotherapy  Informed consent: discussed and consent obtained   Timeout:  patient name, date of birth, surgical site, and procedure verified Lesion destroyed using liquid nitrogen: Yes   Region frozen until ice ball extended beyond lesion: Yes   Outcome: patient tolerated procedure well with no complications   Post-procedure details: wound care instructions given     Return in about 6 months (around 03/08/2022) for AK f/u, ISk f/u.  I, Othelia Pulling, RMA, am acting as scribe for Sarina Ser, MD . Documentation: I have reviewed the above documentation for accuracy and completeness, and I agree with the above.  Sarina Ser, MD

## 2021-09-06 NOTE — Patient Instructions (Addendum)
Cryotherapy Aftercare  Wash gently with soap and water everyday.   Apply Vaseline and Band-Aid daily until healed.     Due to recent changes in healthcare laws, you may see results of your pathology and/or laboratory studies on MyChart before the doctors have had a chance to review them. We understand that in some cases there may be results that are confusing or concerning to you. Please understand that not all results are received at the same time and often the doctors may need to interpret multiple results in order to provide you with the best plan of care or course of treatment. Therefore, we ask that you please give us 2 business days to thoroughly review all your results before contacting the office for clarification. Should we see a critical lab result, you will be contacted sooner.   If You Need Anything After Your Visit  If you have any questions or concerns for your doctor, please call our main line at [phone] and press option 4 to reach your doctor's medical assistant. If no one answers, please leave a voicemail as directed and we will return your call as soon as possible. Messages left after 4 pm will be answered the following business day.   You may also send us a message via MyChart. We typically respond to MyChart messages within 1-2 business days.  For prescription refills, please ask your pharmacy to contact our office. Our fax number is [phone].  If you have an urgent issue when the clinic is closed that cannot wait until the next business day, you can page your doctor at the number below.    Please note that while we do our best to be available for urgent issues outside of office hours, we are not available 24/7.   If you have an urgent issue and are unable to reach us, you may choose to seek medical care at your doctor's office, retail clinic, urgent care center, or emergency room.  If you have a medical emergency, please immediately call 911 or go to the  emergency department.  Pager Numbers  - Dr. Kowalski: [phone]  - Dr. Moye: [phone]  - Dr. Stewart: [phone]  In the event of inclement weather, please call our main line at [phone] for an update on the status of any delays or closures.  Dermatology Medication Tips: Please keep the boxes that topical medications come in in order to help keep track of the instructions about where and how to use these. Pharmacies typically print the medication instructions only on the boxes and not directly on the medication tubes.   If your medication is too expensive, please contact our office at [phone] option 4 or send us a message through MyChart.   We are unable to tell what your co-pay for medications will be in advance as this is different depending on your insurance coverage. However, we may be able to find a substitute medication at lower cost or fill out paperwork to get insurance to cover a needed medication.   If a prior authorization is required to get your medication covered by your insurance company, please allow us 1-2 business days to complete this process.  Drug prices often vary depending on where the prescription is filled and some pharmacies may offer cheaper prices.  The website www.goodrx.com contains coupons for medications through different pharmacies. The prices here do not account for what the cost may be with help from insurance (it may be cheaper with your insurance), but the website can   give you the price if you did not use any insurance.  - You can print the associated coupon and take it with your prescription to the pharmacy.  - You may also stop by our office during regular business hours and pick up a GoodRx coupon card.  - If you need your prescription sent electronically to a different pharmacy, notify our office through [place] MyChart or by phone at [phone] option 4.     Si Usted Necesita Algo Despus de Su Visita  Tambin puede  enviarnos un mensaje a travs de MyChart. Por lo general respondemos a los mensajes de MyChart en el transcurso de 1 a 2 das hbiles.  Para renovar recetas, por favor pida a su farmacia que se ponga en contacto con nuestra oficina. Nuestro nmero de fax es el [phone].  Si tiene un asunto urgente cuando la clnica est cerrada y que no puede esperar hasta el siguiente da hbil, puede llamar/localizar a su doctor(a) al nmero que aparece a continuacin.   Por favor, tenga en cuenta que aunque hacemos todo lo posible para estar disponibles para asuntos urgentes fuera del horario de oficina, no estamos disponibles las 24 horas del da, los 7 das de la semana.   Si tiene un problema urgente y no puede comunicarse con nosotros, puede optar por buscar atencin mdica  en el consultorio de su doctor(a), en una clnica privada, en un centro de atencin urgente o en una sala de emergencias.  Si tiene una emergencia mdica, por favor llame inmediatamente al 911 o vaya a la sala de emergencias.  Nmeros de bper  - Dr. Kowalski: [phone]  - Dra. Moye: [phone]  - Dra. Stewart: [phone]  En caso de inclemencias del tiempo, por favor llame a nuestra lnea principal al [phone] para una actualizacin sobre el estado de cualquier retraso o cierre.  Consejos para la medicacin en dermatologa: Por favor, guarde las cajas en las que vienen los medicamentos de uso tpico para ayudarle a seguir las instrucciones sobre dnde y cmo usarlos. Las farmacias generalmente imprimen las instrucciones del medicamento slo en las cajas y no directamente en los tubos del medicamento.   Si su medicamento es muy caro, por favor, pngase en contacto con nuestra oficina llamando al [phone] y presione la opcin 4 o envenos un mensaje a travs de MyChart.   No podemos decirle cul ser su copago por los medicamentos por adelantado ya que esto es diferente dependiendo de la cobertura de su seguro.  Sin embargo, es posible que podamos encontrar un medicamento sustituto a menor costo o llenar un formulario para que el seguro cubra el medicamento que se considera necesario.   Si se requiere una autorizacin previa para que su compaa de seguros cubra su medicamento, por favor permtanos de 1 a 2 das hbiles para completar este proceso.  Los precios de los medicamentos varan con frecuencia dependiendo del lugar de dnde se surte la receta y alguna farmacias pueden ofrecer precios ms baratos.  El sitio web www.goodrx.com tiene cupones para medicamentos de diferentes farmacias. Los precios aqu no tienen en cuenta lo que podra costar con la ayuda del seguro (puede ser ms barato con su seguro), pero el sitio web puede darle el precio si no utiliz ningn seguro.  - Puede imprimir el cupn correspondiente y llevarlo con su receta a la farmacia.  - Tambin puede pasar por nuestra oficina durante el horario de atencin regular y recoger una tarjeta de cupones de GoodRx.  -   Si necesita que su receta se enve electrnicamente a una farmacia diferente, informe a nuestra oficina a travs de MyChart de [place] o por telfono llamando al [phone] y presione la opcin 4.  

## 2021-09-12 DIAGNOSIS — S46211A Strain of muscle, fascia and tendon of other parts of biceps, right arm, initial encounter: Secondary | ICD-10-CM | POA: Diagnosis not present

## 2021-09-22 ENCOUNTER — Other Ambulatory Visit: Payer: Self-pay | Admitting: Sports Medicine

## 2021-09-22 DIAGNOSIS — M25421 Effusion, right elbow: Secondary | ICD-10-CM | POA: Diagnosis not present

## 2021-09-22 DIAGNOSIS — M25521 Pain in right elbow: Secondary | ICD-10-CM

## 2021-09-22 DIAGNOSIS — M778 Other enthesopathies, not elsewhere classified: Secondary | ICD-10-CM | POA: Diagnosis not present

## 2021-09-22 DIAGNOSIS — S59901A Unspecified injury of right elbow, initial encounter: Secondary | ICD-10-CM | POA: Diagnosis not present

## 2021-10-23 ENCOUNTER — Encounter: Payer: Self-pay | Admitting: Anesthesiology

## 2021-10-23 ENCOUNTER — Ambulatory Visit: Payer: PPO | Attending: Anesthesiology | Admitting: Anesthesiology

## 2021-10-23 DIAGNOSIS — G894 Chronic pain syndrome: Secondary | ICD-10-CM

## 2021-10-23 DIAGNOSIS — M5416 Radiculopathy, lumbar region: Secondary | ICD-10-CM | POA: Diagnosis not present

## 2021-10-23 DIAGNOSIS — M546 Pain in thoracic spine: Secondary | ICD-10-CM

## 2021-10-23 DIAGNOSIS — G8929 Other chronic pain: Secondary | ICD-10-CM

## 2021-10-23 DIAGNOSIS — M5134 Other intervertebral disc degeneration, thoracic region: Secondary | ICD-10-CM

## 2021-10-23 DIAGNOSIS — M5136 Other intervertebral disc degeneration, lumbar region: Secondary | ICD-10-CM | POA: Diagnosis not present

## 2021-10-23 DIAGNOSIS — B0229 Other postherpetic nervous system involvement: Secondary | ICD-10-CM

## 2021-10-23 DIAGNOSIS — F1190 Opioid use, unspecified, uncomplicated: Secondary | ICD-10-CM

## 2021-10-23 DIAGNOSIS — M51369 Other intervertebral disc degeneration, lumbar region without mention of lumbar back pain or lower extremity pain: Secondary | ICD-10-CM

## 2021-10-23 MED ORDER — HYDROCODONE-ACETAMINOPHEN 7.5-325 MG PO TABS: 1.0000 | ORAL_TABLET | Freq: Four times a day (QID) | ORAL | 0 refills | Status: DC | PRN

## 2021-10-23 MED ORDER — HYDROCODONE-ACETAMINOPHEN 7.5-325 MG PO TABS: 1.0000 | ORAL_TABLET | Freq: Four times a day (QID) | ORAL | 0 refills | Status: AC | PRN

## 2021-10-23 NOTE — Progress Notes (Signed)
Virtual Visit via Telephone Note  I connected with Timothy Knapp on 10/23/21 at 11:20 AM EDT by telephone and verified that I am speaking with the correct person using two identifiers.  Location: Patient: Home Provider: Pain control center    I discussed the limitations, risks, security and privacy concerns of performing an evaluation and management service by telephone and the availability of in person appointments. I also discussed with the patient that there may be a patient responsible charge related to this service. The patient expressed understanding and agreed to proceed.   History of Present Illness: I spoke to Timothy Knapp via telephone as we are unable link for the video portion of the conference he reports that he has been doing well with this most recent change in his pharmacologic regimen.  He is taking 2 hydrocodone on a daily basis to help with his chronic thoracic pain.  He has failed more conservative therapy but his current regimen is working well and keeping his pain under good control allowing him to stay active and sleep better at night.  Otherwise the quality characteristic and distribution of the pain remains unchanged and has been stable in nature.  No side effects are reported with the medications that he is rating his pain relief at about 75%.  He is also been able to come off of the baclofen.  Review of systems: General: No fevers or chills Pulmonary: No shortness of breath or dyspnea Cardiac: No angina or palpitations or lightheadedness GI: No abdominal pain or constipation Psych: No depression    Observations/Objective:  Current Outpatient Medications:    [START ON 11/24/2021] HYDROcodone-acetaminophen (NORCO) 7.5-325 MG tablet, Take 1 tablet by mouth every 6 (six) hours as needed for moderate pain or severe pain., Disp: 60 tablet, Rfl: 0   atorvastatin (LIPITOR) 20 MG tablet, Take 20 mg by mouth daily., Disp: , Rfl:    benazepril (LOTENSIN) 20 MG tablet, TAKE 1  TABLET(20 MG) BY MOUTH DAILY, Disp: 30 tablet, Rfl: 0   Cholecalciferol (VITAMIN D3 PO), Take by mouth., Disp: , Rfl:    Cyanocobalamin (B-12 PO), Take 1 Dose by mouth daily., Disp: , Rfl:    dutasteride (AVODART) 0.5 MG capsule, Take 0.5 mg by mouth daily., Disp: , Rfl:    [START ON 10/26/2021] HYDROcodone-acetaminophen (NORCO) 7.5-325 MG tablet, Take 1 tablet by mouth every 6 (six) hours as needed for moderate pain or severe pain., Disp: 60 tablet, Rfl: 0   Omega-3 Fatty Acids (OMEGA-3 PO), Take 788 mg by mouth daily., Disp: , Rfl:    predniSONE (STERAPRED UNI-PAK 21 TAB) 10 MG (21) TBPK tablet, 6 day taper. Take as directed with food, Disp: , Rfl:    Turmeric 500 MG CAPS, Take 500 mg by mouth in the morning and at bedtime., Disp: , Rfl:    vitamin C (ASCORBIC ACID) 500 MG tablet, Take 500 mg by mouth daily., Disp: , Rfl:    zinc gluconate 50 MG tablet, Take 1 tablet by mouth daily., Disp: , Rfl:    Past Medical History:  Diagnosis Date   Actinic keratosis    Arthritis    Basal cell carcinoma    L chest, txted by Dr. Billy Fischer at [facility]   BPH (benign prostatic hypertrophy)    DDD (degenerative disc disease), lumbar 02/26/2020   Hyperlipidemia    Hypertension      Assessment and Plan: 1. Chronic right-sided thoracic back pain   2. DDD (degenerative disc disease), thoracic   3. Chronic  pain syndrome   4. Chronic, continuous use of opioids   5. Postherpetic neuralgia   6. DDD (degenerative disc disease), lumbar   7. Chronic radicular pain of lower back   Based on our discussion today and its appropriate to refill his medicines.  Unfortunately the pharmacy has been out of medication at times.  We are going to date these for 1019 and based on most hours, 1117.  He has been compliant with the regimen with no troubles noted.  I have reviewed the [facility] practitioner database information and is appropriate for refills.  We will schedule him for 77-monthreturn to clinic with continued  follow-up with his primary care physicians for baseline medical care.  Follow Up Instructions:    I discussed the assessment and treatment plan with the patient. The patient was provided an opportunity to ask questions and all were answered. The patient agreed with the plan and demonstrated an understanding of the instructions.   The patient was advised to call back or seek an in-person evaluation if the symptoms worsen or if the condition fails to improve as anticipated.  I provided 30 minutes of non-face-to-face time during this encounter.   JMolli Barrows MD

## 2021-10-31 DIAGNOSIS — R9721 Rising PSA following treatment for malignant neoplasm of prostate: Secondary | ICD-10-CM | POA: Diagnosis not present

## 2021-10-31 DIAGNOSIS — N401 Enlarged prostate with lower urinary tract symptoms: Secondary | ICD-10-CM | POA: Diagnosis not present

## 2021-10-31 DIAGNOSIS — Z79899 Other long term (current) drug therapy: Secondary | ICD-10-CM | POA: Diagnosis not present

## 2021-11-02 DIAGNOSIS — R972 Elevated prostate specific antigen [PSA]: Secondary | ICD-10-CM | POA: Diagnosis not present

## 2021-11-02 DIAGNOSIS — N401 Enlarged prostate with lower urinary tract symptoms: Secondary | ICD-10-CM | POA: Diagnosis not present

## 2021-11-27 ENCOUNTER — Telehealth: Payer: Self-pay | Admitting: *Deleted

## 2021-11-27 ENCOUNTER — Telehealth: Payer: Self-pay | Admitting: Anesthesiology

## 2021-11-27 NOTE — Telephone Encounter (Signed)
PT stated that he need his refilled to be send for  his hydrocodone and meloxicam. Please give patient a call once medications has been send. Thanks

## 2021-11-27 NOTE — Telephone Encounter (Signed)
Needs appt before 12-24-21. Instructions at last appt  in 10/2021 are to return in 2 months.

## 2021-12-06 MED ORDER — MELOXICAM 7.5 MG PO TABS: 7.5000 mg | ORAL_TABLET | Freq: Two times a day (BID) | ORAL | 2 refills | Status: AC

## 2021-12-06 NOTE — Addendum Note (Signed)
Addended by: [name] on: 12/06/2021 09:47 AM   Modules accepted: Orders

## 2021-12-19 ENCOUNTER — Encounter: Payer: Self-pay | Admitting: Anesthesiology

## 2021-12-19 ENCOUNTER — Ambulatory Visit: Payer: PPO | Attending: Anesthesiology | Admitting: Anesthesiology

## 2021-12-19 DIAGNOSIS — G894 Chronic pain syndrome: Secondary | ICD-10-CM

## 2021-12-19 DIAGNOSIS — B0229 Other postherpetic nervous system involvement: Secondary | ICD-10-CM

## 2021-12-19 DIAGNOSIS — M5134 Other intervertebral disc degeneration, thoracic region: Secondary | ICD-10-CM | POA: Diagnosis not present

## 2021-12-19 DIAGNOSIS — M5416 Radiculopathy, lumbar region: Secondary | ICD-10-CM

## 2021-12-19 DIAGNOSIS — M5136 Other intervertebral disc degeneration, lumbar region: Secondary | ICD-10-CM | POA: Diagnosis not present

## 2021-12-19 DIAGNOSIS — F1190 Opioid use, unspecified, uncomplicated: Secondary | ICD-10-CM

## 2021-12-19 DIAGNOSIS — Z79891 Long term (current) use of opiate analgesic: Secondary | ICD-10-CM | POA: Diagnosis not present

## 2021-12-19 DIAGNOSIS — M546 Pain in thoracic spine: Secondary | ICD-10-CM

## 2021-12-19 DIAGNOSIS — G8929 Other chronic pain: Secondary | ICD-10-CM | POA: Diagnosis not present

## 2021-12-19 MED ORDER — HYDROCODONE-ACETAMINOPHEN 7.5-325 MG PO TABS: 1.0000 | ORAL_TABLET | Freq: Four times a day (QID) | ORAL | 0 refills | Status: AC | PRN

## 2021-12-19 MED ORDER — HYDROCODONE-ACETAMINOPHEN 7.5-325 MG PO TABS
1.0000 | ORAL_TABLET | Freq: Four times a day (QID) | ORAL | 0 refills | Status: DC | PRN
Start: 2021-12-27 — End: 2022-01-15

## 2021-12-19 NOTE — Progress Notes (Signed)
Virtual Visit via Telephone Note  I connected with Timothy Knapp on 12/19/21 at  9:20 AM EST by telephone and verified that I am speaking with the correct person using two identifiers.  Location: Patient: Home Provider: Pain control center   I discussed the limitations, risks, security and privacy concerns of performing an evaluation and management service by telephone and the availability of in person appointments. I also discussed with the patient that there may be a patient responsible charge related to this service. The patient expressed understanding and agreed to proceed.   History of Present Illness: I spoke with Timothy Knapp today via telephone as we were unable link for the video portion of the conference call.  He reports that he is doing well.  The quality characteristic and distribution of his pain syndrome is stable with no recent changes.  The thoracic pain has been stable without change and is continuing to respond to his daily opioid regimen of twice a day dosing for the 7.5 mg hydrocodone tablet.  This keeps his pain under good control and allows him to stay functional active and sleep well at night.  No side effects with the medication reported.  No change in upper extremity or lower extremity strength or function.  He continues to play pickle ball and is staying active and is pleased with his current regimen.  Review of systems: General: No fevers or chills Pulmonary: No shortness of breath or dyspnea Cardiac: No angina or palpitations or lightheadedness GI: No abdominal pain or constipation Psych: No depression    Observations/Objective:  Current Outpatient Medications:    [START ON 01/26/2022] HYDROcodone-acetaminophen (NORCO) 7.5-325 MG tablet, Take 1 tablet by mouth every 6 (six) hours as needed for moderate pain or severe pain., Disp: 60 tablet, Rfl: 0   atorvastatin (LIPITOR) 20 MG tablet, Take 20 mg by mouth daily., Disp: , Rfl:    benazepril (LOTENSIN) 20 MG tablet,  TAKE 1 TABLET(20 MG) BY MOUTH DAILY, Disp: 30 tablet, Rfl: 0   Cholecalciferol (VITAMIN D3 PO), Take by mouth., Disp: , Rfl:    Cyanocobalamin (B-12 PO), Take 1 Dose by mouth daily., Disp: , Rfl:    dutasteride (AVODART) 0.5 MG capsule, Take 0.5 mg by mouth daily., Disp: , Rfl:    [START ON 12/27/2021] HYDROcodone-acetaminophen (NORCO) 7.5-325 MG tablet, Take 1 tablet by mouth every 6 (six) hours as needed for moderate pain or severe pain., Disp: 60 tablet, Rfl: 0   meloxicam (MOBIC) 7.5 MG tablet, Take 1 tablet (7.5 mg total) by mouth 2 (two) times daily., Disp: 60 tablet, Rfl: 2   Omega-3 Fatty Acids (OMEGA-3 PO), Take 788 mg by mouth daily., Disp: , Rfl:    predniSONE (STERAPRED UNI-PAK 21 TAB) 10 MG (21) TBPK tablet, 6 day taper. Take as directed with food, Disp: , Rfl:    Turmeric 500 MG CAPS, Take 500 mg by mouth in the morning and at bedtime., Disp: , Rfl:    vitamin C (ASCORBIC ACID) 500 MG tablet, Take 500 mg by mouth daily., Disp: , Rfl:    zinc gluconate 50 MG tablet, Take 1 tablet by mouth daily., Disp: , Rfl:    Past Medical History:  Diagnosis Date   Actinic keratosis    Arthritis    Basal cell carcinoma    L chest, txted by Dr. Billy Fischer at [facility]   BPH (benign prostatic hypertrophy)    DDD (degenerative disc disease), lumbar 02/26/2020   Hyperlipidemia    Hypertension  Assessment and Plan: 1. Chronic right-sided thoracic back pain   2. DDD (degenerative disc disease), thoracic   3. Chronic pain syndrome   4. Chronic, continuous use of opioids   5. Postherpetic neuralgia   6. DDD (degenerative disc disease), lumbar   7. Chronic radicular pain of lower back   Based on our conversation today I think is appropriate to continue with the current medication regimen.  I have reviewed the [facility] practitioner database information and it is appropriate.  Refills will be for December 20 and January 19 with a schedule return to clinic in 2 months.  Continue with his current  activity stretching strengthening and conditioning in addition to the pickleball.  Continue follow-up with his primary care physicians for baseline medical care with return to clinic as mentioned in 2 months.  Follow Up Instructions:    I discussed the assessment and treatment plan with the patient. The patient was provided an opportunity to ask questions and all were answered. The patient agreed with the plan and demonstrated an understanding of the instructions.   The patient was advised to call back or seek an in-person evaluation if the symptoms worsen or if the condition fails to improve as anticipated.  I provided 30 minutes of non-face-to-face time during this encounter.   [name], MD

## 2021-12-23 ENCOUNTER — Encounter: Payer: Self-pay | Admitting: Anesthesiology

## 2022-01-15 ENCOUNTER — Ambulatory Visit: Payer: PPO | Attending: Anesthesiology | Admitting: Anesthesiology

## 2022-01-15 ENCOUNTER — Encounter: Payer: Self-pay | Admitting: Anesthesiology

## 2022-01-15 DIAGNOSIS — M546 Pain in thoracic spine: Secondary | ICD-10-CM

## 2022-01-15 DIAGNOSIS — B0229 Other postherpetic nervous system involvement: Secondary | ICD-10-CM | POA: Diagnosis not present

## 2022-01-15 DIAGNOSIS — G8929 Other chronic pain: Secondary | ICD-10-CM

## 2022-01-15 DIAGNOSIS — M5134 Other intervertebral disc degeneration, thoracic region: Secondary | ICD-10-CM | POA: Diagnosis not present

## 2022-01-15 DIAGNOSIS — G894 Chronic pain syndrome: Secondary | ICD-10-CM

## 2022-01-15 MED ORDER — HYDROCODONE-ACETAMINOPHEN 7.5-325 MG PO TABS: 1.0000 | ORAL_TABLET | Freq: Two times a day (BID) | ORAL | 0 refills | Status: DC | PRN

## 2022-01-15 MED ORDER — HYDROCODONE-ACETAMINOPHEN 7.5-325 MG PO TABS
1.0000 | ORAL_TABLET | Freq: Four times a day (QID) | ORAL | 0 refills | Status: DC | PRN
Start: 2022-03-27 — End: 2022-03-14

## 2022-01-16 NOTE — Progress Notes (Signed)
Virtual Visit via Telephone Note  I connected with Timothy Knapp on 01/16/22 at  2:20 PM EST by telephone and verified that I am speaking with the correct person using two identifiers.  Location: Patient: Home Provider: Pain control center   I discussed the limitations, risks, security and privacy concerns of performing an evaluation and management service by telephone and the availability of in person appointments. I also discussed with the patient that there may be a patient responsible charge related to this service. The patient expressed understanding and agreed to proceed.   History of Present Illness: I spoke with Timothy Knapp via telephone for our video conference.  We were unable link for the video portion of the conference.  He reports that his thoracic chest wall pain is responding favorably to some home remedies including some herbal supplements.  Additionally he is taking his opioid management medications and this combination is keeping his pain under good control and he is having less dysesthesia as reported today.  He is trying to stay active and is sleeping better with his current regimen.  No change in the quality characteristic or distribution of the pain is noted.  Otherwise he been in stable circumstances.  No side effects reported with his medicines.  Review of systems: General: No fevers or chills Pulmonary: No shortness of breath or dyspnea Cardiac: No angina or palpitations or lightheadedness GI: No abdominal pain or constipation Psych: No depression    Observations/Objective:  Current Outpatient Medications:    [START ON 02/25/2022] HYDROcodone-acetaminophen (NORCO) 7.5-325 MG tablet, Take 1 tablet by mouth 2 (two) times daily as needed for moderate pain or severe pain., Disp: 60 tablet, Rfl: 0   atorvastatin (LIPITOR) 20 MG tablet, Take 20 mg by mouth daily., Disp: , Rfl:    benazepril (LOTENSIN) 20 MG tablet, TAKE 1 TABLET(20 MG) BY MOUTH DAILY, Disp: 30 tablet, Rfl:  0   Cholecalciferol (VITAMIN D3 PO), Take by mouth., Disp: , Rfl:    Cyanocobalamin (B-12 PO), Take 1 Dose by mouth daily., Disp: , Rfl:    dutasteride (AVODART) 0.5 MG capsule, Take 0.5 mg by mouth daily., Disp: , Rfl:    [START ON 01/26/2022] HYDROcodone-acetaminophen (NORCO) 7.5-325 MG tablet, Take 1 tablet by mouth every 6 (six) hours as needed for moderate pain or severe pain., Disp: 60 tablet, Rfl: 0   [START ON 03/27/2022] HYDROcodone-acetaminophen (NORCO) 7.5-325 MG tablet, Take 1 tablet by mouth every 6 (six) hours as needed for moderate pain or severe pain., Disp: 60 tablet, Rfl: 0   meloxicam (MOBIC) 7.5 MG tablet, Take 1 tablet (7.5 mg total) by mouth 2 (two) times daily., Disp: 60 tablet, Rfl: 2   Omega-3 Fatty Acids (OMEGA-3 PO), Take 788 mg by mouth daily., Disp: , Rfl:    predniSONE (STERAPRED UNI-PAK 21 TAB) 10 MG (21) TBPK tablet, 6 day taper. Take as directed with food, Disp: , Rfl:    Turmeric 500 MG CAPS, Take 500 mg by mouth in the morning and at bedtime., Disp: , Rfl:    vitamin C (ASCORBIC ACID) 500 MG tablet, Take 500 mg by mouth daily., Disp: , Rfl:    zinc gluconate 50 MG tablet, Take 1 tablet by mouth daily., Disp: , Rfl:    Past Medical History:  Diagnosis Date   Actinic keratosis    Arthritis    Basal cell carcinoma    L chest, txted [facility] Dr[facility] Billy Fischer at [facility]   BPH (benign prostatic hypertrophy)    DDD (  degenerative disc disease), lumbar 02/26/2020   Hyperlipidemia    Hypertension      Assessment and Plan: 1. Chronic right-sided thoracic back pain   2. DDD (degenerative disc disease), thoracic   3. Postherpetic neuralgia   4. Chronic pain syndrome   Based on our discussion today it is appropriate to continue with her current regimen.  I have reviewed the [facility] practitioner database information is appropriate.  Refills are currently placed for January 19 and February 18.  Will schedule him for return to clinic in 2 months with no other changes in his  pharmacologic management.  Continue follow-up with his primary care physicians for baseline medical care.  Follow Up Instructions:    I discussed the assessment and treatment plan with the patient. The patient was provided an opportunity to ask questions and all were answered. The patient agreed with the plan and demonstrated an understanding of the instructions.   The patient was advised to call back or seek an in-person evaluation if the symptoms worsen or if the condition fails to improve as anticipated.  I provided 25 minutes of non-face-to-face time during this encounter.   [name], MD

## 2022-01-24 NOTE — Telephone Encounter (Signed)
Close encounter 

## 2022-02-10 ENCOUNTER — Encounter: Payer: Self-pay | Admitting: Anesthesiology

## 2022-02-13 ENCOUNTER — Telehealth: Payer: Self-pay | Admitting: Anesthesiology

## 2022-02-13 NOTE — Telephone Encounter (Signed)
Patient wants to get a referral sent to [facility]. Dr. Humphrey Rolls Phone is [phone] please ask Dr Andree Elk to put in referral.

## 2022-03-05 ENCOUNTER — Ambulatory Visit: Payer: Medicare Other | Admitting: Anesthesiology

## 2022-03-14 ENCOUNTER — Ambulatory Visit: Payer: Medicare Other | Admitting: Dermatology

## 2022-03-14 ENCOUNTER — Encounter: Payer: Self-pay | Admitting: Dermatology

## 2022-03-14 VITALS — BP 144/82 | HR 66

## 2022-03-14 DIAGNOSIS — L578 Other skin changes due to chronic exposure to nonionizing radiation: Secondary | ICD-10-CM

## 2022-03-14 DIAGNOSIS — L821 Other seborrheic keratosis: Secondary | ICD-10-CM | POA: Diagnosis not present

## 2022-03-14 DIAGNOSIS — L820 Inflamed seborrheic keratosis: Secondary | ICD-10-CM | POA: Diagnosis not present

## 2022-03-14 DIAGNOSIS — L570 Actinic keratosis: Secondary | ICD-10-CM

## 2022-03-14 NOTE — Patient Instructions (Addendum)
Seborrheic Keratosis  What causes seborrheic keratoses? Seborrheic keratoses are harmless, common skin growths that first appear during adult life.  As time goes by, more growths appear.  Some people may develop a large number of them.  Seborrheic keratoses appear on both covered and uncovered body parts.  They are not caused by sunlight.  The tendency to develop seborrheic keratoses can be inherited.  They vary in color from skin-colored to gray, brown, or even black.  They can be either smooth or have a rough, warty surface.   Seborrheic keratoses are superficial and look as if they were stuck on the skin.  Under the microscope this type of keratosis looks like layers upon layers of skin.  That is why at times the top layer may seem to fall off, but the rest of the growth remains and re-grows.    Treatment Seborrheic keratoses do not need to be treated, but can easily be removed in the office.  Seborrheic keratoses often cause symptoms when they rub on clothing or jewelry.  Lesions can be in the way of shaving.  If they become inflamed, they can cause itching, soreness, or burning.  Removal of a seborrheic keratosis can be accomplished by freezing, burning, or surgery. If any spot bleeds, scabs, or grows rapidly, please return to have it checked, as these can be an indication of a skin cancer.     Actinic keratoses are precancerous spots that appear secondary to cumulative UV radiation exposure/sun exposure over time. They are chronic with expected duration over 1 year. A portion of actinic keratoses will progress to squamous cell carcinoma of the skin. It is not possible to reliably predict which spots will progress to skin cancer and so treatment is recommended to prevent development of skin cancer.  Recommend daily broad spectrum sunscreen SPF 30+ to sun-exposed areas, reapply every 2 hours as needed.  Recommend staying in the shade or wearing long sleeves, sun glasses (UVA+UVB protection) and  wide brim hats (4-inch brim around the entire circumference of the hat). Call for new or changing lesions.   Cryotherapy Aftercare  Wash gently with soap and water everyday.   Apply Vaseline and Band-Aid daily until healed.        Due to recent changes in healthcare laws, you may see results of your pathology and/or laboratory studies on MyChart before the doctors have had a chance to review them. We understand that in some cases there may be results that are confusing or concerning to you. Please understand that not all results are received at the same time and often the doctors may need to interpret multiple results in order to provide you with the best plan of care or course of treatment. Therefore, we ask that you please give [place] 2 business days to thoroughly review all your results before contacting the office for clarification. Should we see a critical lab result, you will be contacted sooner.   If You Need Anything After Your Visit  If you have any questions or concerns for your doctor, please call our main line at [phone] and press option 4 to reach your doctor's medical assistant. If no one answers, please leave a voicemail as directed and we will return your call as soon as possible. Messages left after 4 pm will be answered the following business day.   You may also send [place] a message via [place]. We typically respond to MyChart messages within 1-2 business days.  For prescription refills, please ask your pharmacy  to contact our office. Our fax number is [phone].  If you have an urgent issue when the clinic is closed that cannot wait until the next business day, you can page your doctor at the number below.    Please note that while we do our best to be available for urgent issues outside of office hours, we are not available 24/7.   If you have an urgent issue and are unable to reach [place], you may choose to seek medical care at your doctor's office, retail clinic, urgent  care center, or emergency room.  If you have a medical emergency, please immediately call 911 or go to the emergency department.  Pager Numbers  - Dr. Nehemiah Massed: [phone]  - Dr. Laurence Ferrari: [phone]  - Dr. Nicole Kindred: [phone]  In the event of inclement weather, please call our main line at [phone] for an update on the status of any delays or closures.  Dermatology Medication Tips: Please keep the boxes that topical medications come in in order to help keep track of the instructions about where and how to use these. Pharmacies typically print the medication instructions only on the boxes and not directly on the medication tubes.   If your medication is too expensive, please contact our office at [phone] option 4 or send [place] a message through [place].   We are unable to tell what your co-pay for medications will be in advance as this is different depending on your insurance coverage. However, we may be able to find a substitute medication at lower cost or fill out paperwork to get insurance to cover a needed medication.   If a prior authorization is required to get your medication covered by your insurance company, please allow [place] 1-2 business days to complete this process.  Drug prices often vary depending on where the prescription is filled and some pharmacies may offer cheaper prices.  The website www.goodrx.com contains coupons for medications through different pharmacies. The prices here do not account for what the cost may be with help from insurance (it may be cheaper with your insurance), but the website can give you the price if you did not use any insurance.  - You can print the associated coupon and take it with your prescription to the pharmacy.  - You may also stop by our office during regular business hours and pick up a GoodRx coupon card.  - If you need your prescription sent electronically to a different pharmacy, notify our office through [facility] or  by phone at [phone] option 4.     Si Usted Necesita Algo Despus de Su Visita  Tambin puede enviarnos un mensaje a travs de Pharmacist, community. Por lo general respondemos a los mensajes de MyChart en el transcurso de 1 a 2 das hbiles.  Para renovar recetas, por favor pida a su farmacia que se ponga en contacto con nuestra oficina. Harland Dingwall de fax es [place] [phone].  Si tiene un asunto urgente cuando la clnica est cerrada y que no puede esperar hasta el siguiente da hbil, puede llamar/localizar a su doctor(a) al nmero que aparece a continuacin.   Por favor, tenga en cuenta que aunque hacemos todo lo posible para estar disponibles para asuntos urgentes fuera del horario de [place], no estamos disponibles las 24 horas del da, los 7 das de la [place].   Si tiene un problema urgente y no puede comunicarse con nosotros, puede optar por buscar atencin mdica  en el consultorio de su doctor(a), en [facility]  clnica privada, en un centro de atencin urgente o en una sala de emergencias.  Si tiene Engineering geologist, por favor llame inmediatamente al 911 o vaya a la sala de emergencias.  Nmeros de bper  - Dr. Nehemiah Massed: [phone]  - Dra. Moye: [phone]  - Dra. Nicole Kindred: [phone]  En caso de inclemencias del [place], por favor llame a Johnsie Kindred principal al [phone] para una actualizacin sobre el [place] de cualquier retraso o cierre.  Consejos para la medicacin en dermatologa: Por favor, guarde las cajas en las que vienen los medicamentos de uso tpico para ayudarle a seguir las instrucciones sobre dnde y cmo usarlos. Las farmacias generalmente imprimen las instrucciones del medicamento slo en las cajas y no directamente en los tubos del [place].   Si su medicamento es muy caro, por favor, pngase en contacto con Zigmund Daniel llamando al [phone] y presione la opcin 4 o envenos un mensaje a travs de Pharmacist, community.   No podemos decirle cul ser su  copago por los medicamentos por adelantado ya que esto es diferente dependiendo de la cobertura de su seguro. Sin embargo, es posible que podamos encontrar un medicamento sustituto a Electrical engineer un formulario para que el seguro cubra el medicamento que se considera necesario.   Si se requiere una autorizacin previa para que su compaa de seguros [place] su medicamento, por favor permtanos de 1 a 2 das hbiles para completar este proceso.  Los precios de los medicamentos varan con frecuencia dependiendo del Environmental consultant de dnde se surte la receta y alguna farmacias pueden ofrecer precios ms baratos.  El sitio web www.goodrx.com tiene cupones para medicamentos de Airline pilot. Los precios aqu no tienen en cuenta lo que podra costar con la ayuda del seguro (puede ser ms barato con su seguro), pero el sitio web puede darle el precio si no utiliz Research scientist (physical sciences).  - Puede imprimir el cupn correspondiente y llevarlo con su receta a la farmacia.  - Tambin puede pasar por nuestra oficina durante el horario de atencin regular y Charity fundraiser una tarjeta de cupones de GoodRx.  - Si necesita que su receta se enve electrnicamente a una farmacia diferente, informe a nuestra oficina a travs de MyChart de [place] o por telfono llamando al [phone] y presione la opcin 4.

## 2022-03-14 NOTE — Progress Notes (Signed)
Follow-Up Visit   Subjective  Timothy Knapp is a 82 y.o. male who presents for the following: Follow-up (6 month ak follow up, patient noticed a few places at ears, face, back he would like checked. ). The patient has spots, moles and lesions to be evaluated, some may be new or changing and the patient has concerns that these could be cancer.  The following portions of the chart were reviewed this encounter and updated as appropriate:  Tobacco  Allergies  Meds  Problems  Med Hx  Surg Hx  Fam Hx     Review of Systems: No other skin or systemic complaints except as noted in HPI or Assessment and Plan.  Objective  Well appearing patient in no apparent distress; mood and affect are within normal limits.  A focused examination was performed including face, ears, back, hands, arms. Relevant physical exam findings are noted in the Assessment and Plan.  face and ears x 7 , back x 15,  right hand x 1 (23) Erythematous stuck-on, waxy papule or plaque  left cheek x 1 Erythematous thin papules/macules with gritty scale.    Assessment & Plan  Inflamed seborrheic keratosis (23) face and ears x 7 , back x 15,  right hand x 1 Symptomatic, irritating, patient would like treated. Destruction of lesion - face and ears x 7 , back x 15,  right hand x 1 Complexity: simple   Destruction method: cryotherapy   Informed consent: discussed and consent obtained   Timeout:  patient name, date of birth, surgical site, and procedure verified Lesion destroyed using liquid nitrogen: Yes   Region frozen until ice ball extended beyond lesion: Yes   Outcome: patient tolerated procedure well with no complications   Post-procedure details: wound care instructions given   Additional details:  Prior to procedure, discussed risks of blister formation, small wound, skin dyspigmentation, or rare scar following cryotherapy. Recommend Vaseline ointment to treated areas while healing.  Actinic keratosis left cheek x  1 Actinic keratoses are precancerous spots that appear secondary to cumulative UV radiation exposure/sun exposure over time. They are chronic with expected duration over 1 year. A portion of actinic keratoses will progress to squamous cell carcinoma of the skin. It is not possible to reliably predict which spots will progress to skin cancer and so treatment is recommended to prevent development of skin cancer.  Recommend daily broad spectrum sunscreen SPF 30+ to sun-exposed areas, reapply every 2 hours as needed.  Recommend staying in the shade or wearing long sleeves, sun glasses (UVA+UVB protection) and wide brim hats (4-inch brim around the entire circumference of the hat). Call for new or changing lesions.  Destruction of lesion - left cheek x 1 Complexity: simple   Destruction method: cryotherapy   Informed consent: discussed and consent obtained   Timeout:  patient name, date of birth, surgical site, and procedure verified Lesion destroyed using liquid nitrogen: Yes   Region frozen until ice ball extended beyond lesion: Yes   Outcome: patient tolerated procedure well with no complications   Post-procedure details: wound care instructions given   Additional details:  Prior to procedure, discussed risks of blister formation, small wound, skin dyspigmentation, or rare scar following cryotherapy. Recommend Vaseline ointment to treated areas while healing.  Seborrheic Keratoses - Stuck-on, waxy, tan-brown papules and/or plaques  - Benign-appearing - Discussed benign etiology and prognosis. - Observe - Call for any changes  Actinic Damage - chronic, secondary to cumulative UV radiation exposure/sun exposure over  time - diffuse scaly erythematous macules with underlying dyspigmentation - Recommend daily broad spectrum sunscreen SPF 30+ to sun-exposed areas, reapply every 2 hours as needed.  - Recommend staying in the shade or wearing long sleeves, sun glasses (UVA+UVB protection) and wide  brim hats (4-inch brim around the entire circumference of the hat). - Call for new or changing lesions.  F/U 6 mos  I, Ruthell Rummage, CMA, am acting as scribe for Sarina Ser, MD. Documentation: I have reviewed the above documentation for accuracy and completeness, and I agree with the above.  Sarina Ser, MD

## 2022-03-18 ENCOUNTER — Encounter: Payer: Self-pay | Admitting: Dermatology

## 2022-03-19 ENCOUNTER — Encounter: Payer: Self-pay | Admitting: Ophthalmology

## 2022-03-21 NOTE — Discharge Instructions (Signed)

## 2022-03-26 ENCOUNTER — Ambulatory Visit: Payer: Medicare Other | Admitting: Anesthesiology

## 2022-03-26 ENCOUNTER — Encounter
Admission: RE | Disposition: A | Discharge status: Home / Self Care | Payer: Self-pay | Source: Ambulatory Visit | Attending: Ophthalmology

## 2022-03-26 ENCOUNTER — Encounter: Payer: Self-pay | Admitting: Ophthalmology

## 2022-03-26 ENCOUNTER — Other Ambulatory Visit: Payer: Self-pay

## 2022-03-26 ENCOUNTER — Ambulatory Visit
Admission: RE | Admit: 2022-03-26 | Discharge: 2022-03-26 | Disposition: A | Discharge status: Home / Self Care | Payer: Medicare Other | Source: Ambulatory Visit | Attending: Ophthalmology | Admitting: Ophthalmology

## 2022-03-26 DIAGNOSIS — N400 Enlarged prostate without lower urinary tract symptoms: Secondary | ICD-10-CM | POA: Insufficient documentation

## 2022-03-26 DIAGNOSIS — I10 Essential (primary) hypertension: Secondary | ICD-10-CM | POA: Diagnosis not present

## 2022-03-26 DIAGNOSIS — H2511 Age-related nuclear cataract, right eye: Secondary | ICD-10-CM | POA: Insufficient documentation

## 2022-03-26 DIAGNOSIS — E785 Hyperlipidemia, unspecified: Secondary | ICD-10-CM | POA: Diagnosis not present

## 2022-03-26 HISTORY — PX: CATARACT EXTRACTION W/PHACO: SHX586

## 2022-03-26 SURGERY — PHACOEMULSIFICATION, CATARACT, WITH IOL INSERTION
Anesthesia: Monitor Anesthesia Care | Site: Eye | Laterality: Right

## 2022-03-26 MED ORDER — SIGHTPATH DOSE#1 NA HYALUR & NA CHOND-NA HYALUR IO KIT
PACK | INTRAOCULAR | Status: DC | PRN
  Administered 2022-03-26: 1 via OPHTHALMIC

## 2022-03-26 MED ORDER — LIDOCAINE HCL (PF) 2 % IJ SOLN
INTRAOCULAR | Status: DC | PRN
  Administered 2022-03-26: 1 mL via INTRAOCULAR

## 2022-03-26 MED ORDER — FENTANYL CITRATE (PF) 100 MCG/2ML IJ SOLN
INTRAMUSCULAR | Status: DC | PRN
  Administered 2022-03-26 (×2): 50 ug via INTRAVENOUS

## 2022-03-26 MED ORDER — ARMC OPHTHALMIC DILATING DROPS
1.0000 | OPHTHALMIC | Status: DC | PRN
  Administered 2022-03-26 (×3): 1 via OPHTHALMIC

## 2022-03-26 MED ORDER — TETRACAINE HCL 0.5 % OP SOLN
1.0000 [drp] | OPHTHALMIC | Status: DC | PRN
  Administered 2022-03-26 (×3): 1 [drp] via OPHTHALMIC

## 2022-03-26 MED ORDER — MOXIFLOXACIN HCL 0.5 % OP SOLN
OPHTHALMIC | Status: DC | PRN
  Administered 2022-03-26: .2 mL via OPHTHALMIC

## 2022-03-26 MED ORDER — SIGHTPATH DOSE#1 BSS IO SOLN
INTRAOCULAR | Status: DC | PRN
  Administered 2022-03-26: 94 mL via OPHTHALMIC

## 2022-03-26 MED ORDER — SIGHTPATH DOSE#1 BSS IO SOLN
INTRAOCULAR | Status: DC | PRN
  Administered 2022-03-26: 15 mL

## 2022-03-26 MED ORDER — MIDAZOLAM HCL 2 MG/2ML IJ SOLN
INTRAMUSCULAR | Status: DC | PRN
  Administered 2022-03-26: 1 mg via INTRAVENOUS

## 2022-03-26 MED ORDER — LACTATED RINGERS IV SOLN: INTRAVENOUS | Status: DC

## 2022-03-26 SURGICAL SUPPLY — 14 items
CATARACT SUITE SIGHTPATH (MISCELLANEOUS) ×1 IMPLANT
DISSECTOR HYDRO NUCLEUS 50X22 (MISCELLANEOUS) ×2 IMPLANT
FEE CATARACT SUITE SIGHTPATH (MISCELLANEOUS) ×2 IMPLANT
GLOVE SURG GAMMEX PI TX LF 7.5 (GLOVE) ×2 IMPLANT
GLOVE SURG SYN 8.5  E (GLOVE) ×1
GLOVE SURG SYN 8.5 E (GLOVE) ×1 IMPLANT
GLOVE SURG SYN 8.5 PF PI (GLOVE) ×2 IMPLANT
LENS CLAREON PANOPTIX 12.5 ×1 IMPLANT
LENS IOL CLRN PAN 5 12.5 IMPLANT
NDL FILTER BLUNT 18X1 1/2 (NEEDLE) ×2 IMPLANT
NEEDLE FILTER BLUNT 18X1 1/2 (NEEDLE) ×1 IMPLANT
SYR 3ML LL SCALE MARK (SYRINGE) ×2 IMPLANT
SYR 5ML LL (SYRINGE) ×2 IMPLANT
WATER STERILE IRR 250ML POUR (IV SOLUTION) ×2 IMPLANT

## 2022-03-26 NOTE — Anesthesia Postprocedure Evaluation (Signed)
Anesthesia Post Note  Patient: Timothy Knapp  Procedure(s) Performed: CATARACT EXTRACTION PHACO AND INTRAOCULAR LENS PLACEMENT (IOC) RIGHT DIABETIC CLAREON PANOPTIC TORIC LENS  4.96  00:30.3 (Right: Eye)  Patient location during evaluation: PACU Anesthesia Type: MAC Level of consciousness: awake and alert Pain management: pain level controlled Vital Signs Assessment: post-procedure vital signs reviewed and stable Respiratory status: spontaneous breathing, nonlabored ventilation and respiratory function stable Cardiovascular status: blood pressure returned to baseline and stable Postop Assessment: no apparent nausea or vomiting Anesthetic complications: no   No notable events documented.   Last Vitals:  Vitals:   03/26/22 0919 03/26/22 0923  BP:  99/61  Pulse: (!) 53 (!) 50  Resp: 16 13  Temp: 36.8 C   SpO2: 94% 93%    Last Pain:  Vitals:   03/26/22 0923  TempSrc:   PainSc: 0-No pain                 [place] Ouch

## 2022-03-26 NOTE — Op Note (Signed)
OPERATIVE NOTE  Timothy Knapp [id number] 03/26/2022   PREOPERATIVE DIAGNOSIS:  Nuclear sclerotic cataract right eye.  H25.11   POSTOPERATIVE DIAGNOSIS:    Nuclear sclerotic cataract right eye.     PROCEDURE:  Phacoemusification with posterior chamber intraocular lens placement of the right eye   LENS:   Implant Name Type Inv. Item Serial No. Manufacturer Lot No. LRB No. Used Action  CLAREON PANOPTIX TORIC IOL Intraocular Lens  [id number]  Right 1 Implanted       Procedure(s): CATARACT EXTRACTION PHACO AND INTRAOCULAR LENS PLACEMENT (IOC) RIGHT DIABETIC CLAREON PANOPTIC TORIC LENS  4.96  00:30.3 (Right)  [id number] +12.5   ULTRASOUND TIME: 0 minutes 30 seconds.  CDE 4.96   SURGEON:  Benay Pillow, MD, MPH  ANESTHESIOLOGIST: Anesthesiologist: Iran Ouch, MD CRNA: Moises Blood, CRNA   ANESTHESIA:  Topical with tetracaine drops augmented with 1% preservative-free intracameral lidocaine.  ESTIMATED BLOOD LOSS: less than 1 mL.   COMPLICATIONS:  None.   DESCRIPTION OF PROCEDURE:  The patient was identified in the holding room and transported to the operating room and placed in the supine position under the operating microscope.  The right eye was identified as the operative eye and it was prepped and draped in the usual sterile ophthalmic fashion.  The verion system was registered without difficulty.   A 1.0 millimeter clear-corneal paracentesis was made at the 10:30 position. 0.5 ml of preservative-free 1% lidocaine with epinephrine was injected into the anterior chamber.  The anterior chamber was filled with viscoelastic.  A 2.4 millimeter keratome was used to make a near-clear corneal incision at the 8:00 position.  A curvilinear capsulorrhexis was made with a cystotome and capsulorrhexis forceps.  Balanced salt solution was used to hydrodissect and hydrodelineate the nucleus.   Phacoemulsification was then used in stop and chop fashion to remove the lens nucleus  and epinucleus.  The remaining cortex was then removed using the irrigation and aspiration handpiece. Viscoelastic was then placed into the capsular bag to distend it for lens placement.  A lens was then injected into the capsular bag.  The remaining viscoelastic was aspirated.  The lens was rotated with guidance from the [place] system.   Wounds were hydrated with balanced salt solution.  The anterior chamber was inflated to a physiologic pressure with balanced salt solution. The lens was well centered.  Intracameral vigamox 0.1 mL undiluted was injected into the eye and a drop placed onto the ocular surface.  No wound leaks were noted.  The patient was taken to the recovery room in stable condition without complications of anesthesia or surgery  Benay Pillow 03/26/2022, 9:17 AM

## 2022-03-26 NOTE — H&P (Signed)
[facility]   Primary Care Physician:  Maryland Pink, MD Ophthalmologist: Dr. Benay Pillow  Pre-Procedure History & Physical: HPI:  Timothy Knapp is a 82 y.o. male here for cataract surgery.   Past Medical History:  Diagnosis Date   Actinic keratosis    Arthritis    Basal cell carcinoma    L chest, txted by Dr. Billy Fischer at [facility]   BPH (benign prostatic hypertrophy)    DDD (degenerative disc disease), lumbar 02/26/2020   Hyperlipidemia    Hypertension     Past Surgical History:  Procedure Laterality Date   COLONOSCOPY WITH PROPOFOL N/A 03/20/2017   Procedure: COLONOSCOPY WITH PROPOFOL;  Surgeon: Virgel Manifold, MD;  Location: [facility] END[facility];  Service: Endoscopy;  Laterality: N/A;   COLONOSCOPY WITH PROPOFOL N/A 02/19/2020   Procedure: COLONOSCOPY WITH PROPOFOL;  Surgeon: Virgel Manifold, MD;  Location: [facility] ENDOS[facility];  Service: Endoscopy;  Laterality: N/A;   JOINT REPLACEMENT     KNEE ARTHROPLASTY Right 12/10/2016   Procedure: COMPUTER ASSISTED TOTAL KNEE ARTHROPLASTY;  Surgeon: Dereck Leep, MD;  Location: [facility] ORS;  Service: Orthopedics;  Laterality: Right;   PROSTATE SURGERY N/A    SKIN CANCER EXCISION  11/09/2015    Prior to Admission medications   Medication Sig Start Date End Date Taking? Authorizing Provider  ALPHA LIPOIC ACID PO Take by mouth daily.   Yes [provider]  atorvastatin (LIPITOR) 20 MG tablet Take 20 mg by mouth daily.   Yes [provider]  B Complex Vitamins (B COMPLEX 1 PO) Take by mouth daily.   Yes [provider]  benazepril (LOTENSIN) 20 MG tablet TAKE 1 TABLET(20 MG) BY MOUTH DAILY 08/15/19  Yes Cannady, Jolene T, NP  Black Pepper-Turmeric (TURMERIC PLUS BLACK PEPPER EXT PO) Take by mouth in the morning and at bedtime.   Yes [provider]  dutasteride (AVODART) 0.5 MG capsule Take 0.5 mg by mouth daily.   Yes [provider]  Lycopene 10 MG CAPS Take 20 mg by mouth in the morning and at  bedtime.   Yes [provider]  MELATONIN PO Take by mouth at bedtime.   Yes [provider]  Misc Natural Products (GLUCOS-CHONDROIT-MSM COMPLEX PO) Take 4 tablets by mouth daily.   Yes [provider]  Multiple Vitamins-Minerals (MULTIVITAMIN MEN PO) Take by mouth daily.   Yes [provider]  naproxen sodium (ALEVE) 220 MG tablet Take 220 mg by mouth 2 (two) times daily as needed.   Yes [provider]  Omega-3 Fatty Acids (OMEGA-3 PO) Take 788 mg by mouth daily.   Yes [provider]  zinc gluconate 50 MG tablet Take 50 mg by mouth daily.   Yes [provider]    Allergies as of 02/26/2022   (No Known Allergies)    Family History  Problem Relation Age of Onset   Heart disease Mother    Prostate cancer Neg Hx    Bladder Cancer Neg Hx    Kidney cancer Neg Hx     Social History   Socioeconomic History   Marital status: Married    Spouse name: Not on file   Number of children: Not on file   Years of education: Not on file   Highest education level: Not on file  Occupational History   Not on file  Tobacco Use   Smoking status: Never   Smokeless tobacco: Never  Vaping Use   Vaping Use: Never used  Substance and  Sexual Activity   Alcohol use: No    Alcohol/week: 0.0 standard drinks of alcohol   Drug use: No   Sexual activity: Not on file  Other Topics Concern   Not on file  Social History Narrative   Not on file   Social Determinants of Health   Financial Resource Strain: Low Risk  (12/05/2016)   Overall Financial Resource Strain (CARDIA)    Difficulty of Paying Living Expenses: Not hard at all  Food Insecurity: Unknown (12/05/2016)   Hunger Vital Sign    Worried About Running Out of Food in the Last Year: Patient declined    [place] in the Last Year: Patient declined  Transportation Needs: Unknown (12/05/2016)   [place] - Hydrologist (Medical): Patient declined     Lack of Transportation (Non-Medical): Patient declined  Physical Activity: Unknown (12/05/2016)   Exercise Vital Sign    Days of Exercise per Week: Patient declined    Minutes of Exercise per Session: Patient declined  Stress: No Stress Concern Present (12/05/2016)   [place] of Stress : Not at all  Social Connections: Unknown (12/05/2016)   Social Connection and Isolation Panel [NHANES]    Frequency of Communication with Friends and Family: Patient declined    Frequency of Social Gatherings with Friends and Family: Patient declined    Attends Religious Services: Patient declined    Marine scientist or Organizations: Patient declined    Attends Archivist Meetings: Patient declined    Marital Status: Patient declined  Intimate Partner Violence: Unknown (12/05/2016)   Humiliation, Afraid, Rape, and Kick questionnaire    Fear of Current or Ex-Partner: Patient declined    Emotionally Abused: Patient declined    Physically Abused: Patient declined    Sexually Abused: Patient declined    Review of Systems: See HPI, otherwise negative ROS  Physical Exam: BP (!) 150/69   Pulse 64   Temp 98.7 F (37.1 C) (Temporal)   Resp 18   Ht 5\' 8"  (1.727 m)   Wt 71.6 kg   SpO2 94%   BMI 23.99 kg/m  General:   Alert, cooperative in NAD Head:  Normocephalic and atraumatic. Respiratory:  Normal work of breathing. Cardiovascular:  RRR  Impression/Plan: Timothy Knapp is here for cataract surgery.  Risks, benefits, limitations, and alternatives regarding cataract surgery have been reviewed with the patient.  Questions have been answered.  All parties agreeable.   Benay Pillow, MD  03/26/2022, 8:49 AM

## 2022-03-26 NOTE — Anesthesia Preprocedure Evaluation (Addendum)
Anesthesia Evaluation  Patient identified by MRN, date of birth, ID band Patient awake    Reviewed: Allergy & Precautions, H&P , NPO status , Patient's Chart, lab work & pertinent test results  History of Anesthesia Complications Negative for: history of anesthetic complications  Airway Mallampati: II  TM Distance: >3 FB Neck ROM: limited    Dental  (+) Teeth Intact   Pulmonary neg pulmonary ROS, neg shortness of breath   breath sounds clear to auscultation       Cardiovascular Exercise Tolerance: Good hypertension, (-) angina (-) Past MI and (-) DOE  Rhythm:Regular Rate:Normal - Systolic murmurs    Neuro/Psych  Neuromuscular disease  negative psych ROS   GI/Hepatic negative GI ROS, Neg liver ROS,neg GERD  ,,  Endo/Other  negative endocrine ROS    Renal/GU negative Renal ROS  negative genitourinary   Musculoskeletal  (+) Arthritis ,    Abdominal Normal abdominal exam  (+)   Peds  Hematology negative hematology ROS (+)   Anesthesia Other Findings Past Medical History: No date: Arthritis No date: BPH (benign prostatic hypertrophy) No date: Hyperlipidemia No date: Hypertension  Past Surgical History: No date: JOINT REPLACEMENT 12/10/2016: KNEE ARTHROPLASTY; Right     Comment:  Procedure: COMPUTER ASSISTED TOTAL KNEE ARTHROPLASTY;                Surgeon: Dereck Leep, MD;  Location: [facility] ORS;                Service: Orthopedics;  Laterality: Right; No date: PROSTATE SURGERY; N/A 11/09/2015: SKIN CANCER EXCISION     Reproductive/Obstetrics negative OB ROS                             Anesthesia Physical Anesthesia Plan  ASA: II  Anesthesia Plan: MAC   Post-op Pain Management:    Induction: Intravenous  PONV Risk Score and Plan: Treatment may vary due to age or medical condition  Airway Management Planned: Natural Airway and Nasal Cannula  Additional Equipment:  None  Intra-op Plan:   Post-operative Plan:   Informed Consent:   Plan Discussed with: Anesthesiologist, CRNA and Surgeon  Anesthesia Plan Comments:         Anesthesia Quick Evaluation

## 2022-03-26 NOTE — Transfer of Care (Signed)
Immediate Anesthesia Transfer of Care Note  Patient: Timothy Knapp  Procedure(s) Performed: CATARACT EXTRACTION PHACO AND INTRAOCULAR LENS PLACEMENT (IOC) RIGHT DIABETIC CLAREON PANOPTIC TORIC LENS  4.96  00:30.3 (Right: Eye)  Patient Location: PACU  Anesthesia Type: MAC  Level of Consciousness: awake, alert  and patient cooperative  Airway and Oxygen Therapy: Patient Spontanous Breathing and Patient connected to supplemental oxygen  Post-op Assessment: Post-op Vital signs reviewed, Patient's Cardiovascular Status Stable, Respiratory Function Stable, Patent Airway and No signs of Nausea or vomiting  Post-op Vital Signs: Reviewed and stable  Complications: No notable events documented.

## 2022-03-27 ENCOUNTER — Other Ambulatory Visit: Payer: Self-pay

## 2022-03-27 ENCOUNTER — Encounter: Payer: Self-pay | Admitting: Ophthalmology

## 2022-04-04 NOTE — Discharge Instructions (Signed)

## 2022-04-09 ENCOUNTER — Ambulatory Visit: Payer: Medicare Other | Admitting: Anesthesiology

## 2022-04-09 ENCOUNTER — Other Ambulatory Visit: Payer: Self-pay

## 2022-04-09 ENCOUNTER — Encounter
Admission: RE | Disposition: A | Discharge status: Home / Self Care | Payer: Self-pay | Source: Home / Self Care | Attending: Ophthalmology

## 2022-04-09 ENCOUNTER — Encounter: Payer: Self-pay | Admitting: Ophthalmology

## 2022-04-09 ENCOUNTER — Ambulatory Visit
Admission: RE | Admit: 2022-04-09 | Discharge: 2022-04-09 | Disposition: A | Discharge status: Home / Self Care | Payer: Medicare Other | Attending: Ophthalmology | Admitting: Ophthalmology

## 2022-04-09 DIAGNOSIS — E785 Hyperlipidemia, unspecified: Secondary | ICD-10-CM | POA: Insufficient documentation

## 2022-04-09 DIAGNOSIS — I10 Essential (primary) hypertension: Secondary | ICD-10-CM | POA: Diagnosis not present

## 2022-04-09 DIAGNOSIS — Z79899 Other long term (current) drug therapy: Secondary | ICD-10-CM | POA: Insufficient documentation

## 2022-04-09 DIAGNOSIS — N400 Enlarged prostate without lower urinary tract symptoms: Secondary | ICD-10-CM | POA: Insufficient documentation

## 2022-04-09 DIAGNOSIS — H2512 Age-related nuclear cataract, left eye: Secondary | ICD-10-CM | POA: Insufficient documentation

## 2022-04-09 DIAGNOSIS — E1136 Type 2 diabetes mellitus with diabetic cataract: Secondary | ICD-10-CM | POA: Insufficient documentation

## 2022-04-09 HISTORY — PX: CATARACT EXTRACTION W/PHACO: SHX586

## 2022-04-09 SURGERY — PHACOEMULSIFICATION, CATARACT, WITH IOL INSERTION
Anesthesia: Monitor Anesthesia Care | Site: Eye | Laterality: Left

## 2022-04-09 MED ORDER — FENTANYL CITRATE (PF) 100 MCG/2ML IJ SOLN
INTRAMUSCULAR | Status: DC | PRN
  Administered 2022-04-09 (×2): 50 ug via INTRAVENOUS

## 2022-04-09 MED ORDER — SIGHTPATH DOSE#1 NA HYALUR & NA CHOND-NA HYALUR IO KIT
PACK | INTRAOCULAR | Status: DC | PRN
  Administered 2022-04-09: 1 via OPHTHALMIC

## 2022-04-09 MED ORDER — LIDOCAINE HCL (PF) 2 % IJ SOLN
INTRAOCULAR | Status: DC | PRN
  Administered 2022-04-09: 1 mL via INTRAOCULAR

## 2022-04-09 MED ORDER — MOXIFLOXACIN HCL 0.5 % OP SOLN
OPHTHALMIC | Status: DC | PRN
  Administered 2022-04-09: .2 mL via OPHTHALMIC

## 2022-04-09 MED ORDER — TETRACAINE HCL 0.5 % OP SOLN
1.0000 [drp] | OPHTHALMIC | Status: DC | PRN
  Administered 2022-04-09 (×3): 1 [drp] via OPHTHALMIC

## 2022-04-09 MED ORDER — SIGHTPATH DOSE#1 BSS IO SOLN
INTRAOCULAR | Status: DC | PRN
  Administered 2022-04-09: 94 mL via OPHTHALMIC

## 2022-04-09 MED ORDER — SIGHTPATH DOSE#1 BSS IO SOLN
INTRAOCULAR | Status: DC | PRN
  Administered 2022-04-09: 15 mL

## 2022-04-09 MED ORDER — MIDAZOLAM HCL 2 MG/2ML IJ SOLN
INTRAMUSCULAR | Status: DC | PRN
  Administered 2022-04-09 (×2): 1 mg via INTRAVENOUS

## 2022-04-09 MED ORDER — ARMC OPHTHALMIC DILATING DROPS
1.0000 | OPHTHALMIC | Status: DC | PRN
  Administered 2022-04-09 (×3): 1 via OPHTHALMIC

## 2022-04-09 SURGICAL SUPPLY — 14 items
CATARACT SUITE SIGHTPATH (MISCELLANEOUS) ×1 IMPLANT
DISSECTOR HYDRO NUCLEUS 50X22 (MISCELLANEOUS) ×2 IMPLANT
FEE CATARACT SUITE SIGHTPATH (MISCELLANEOUS) ×2 IMPLANT
GLOVE SURG GAMMEX PI TX LF 7.5 (GLOVE) ×2 IMPLANT
GLOVE SURG SYN 8.5  E (GLOVE) ×1
GLOVE SURG SYN 8.5 E (GLOVE) ×1 IMPLANT
GLOVE SURG SYN 8.5 PF PI (GLOVE) ×2 IMPLANT
LENS CLAREON PANOPTIX TRC 15.5 ×1 IMPLANT
LENS IOL CLRN PAN 3 15.5 IMPLANT
NDL FILTER BLUNT 18X1 1/2 (NEEDLE) ×2 IMPLANT
NEEDLE FILTER BLUNT 18X1 1/2 (NEEDLE) ×1 IMPLANT
SYR 3ML LL SCALE MARK (SYRINGE) ×2 IMPLANT
SYR 5ML LL (SYRINGE) ×2 IMPLANT
WATER STERILE IRR 250ML POUR (IV SOLUTION) ×2 IMPLANT

## 2022-04-09 NOTE — Anesthesia Preprocedure Evaluation (Addendum)
Anesthesia Evaluation  Patient identified by MRN, date of birth, ID band Patient awake    Airway Mallampati: III  TM Distance: >3 FB Neck ROM: Full    Dental no notable dental hx.    Pulmonary neg pulmonary ROS   breath sounds clear to auscultation       Cardiovascular hypertension, Pt. on medications  Rhythm:Regular Rate:Normal  hyperlipidemia   Neuro/Psych Back pain, DDD   negative psych ROS   GI/Hepatic negative GI ROS,,,  Endo/Other  negative endocrine ROS    Renal/GU negative Renal ROS   BPH    Musculoskeletal  (+) Arthritis , Osteoarthritis,    Abdominal Normal abdominal exam  (+)   Peds  Hematology   Anesthesia Other Findings Hx BCC  Reproductive/Obstetrics                             Anesthesia Physical Anesthesia Plan  ASA: 2  Anesthesia Plan: MAC   Post-op Pain Management:    Induction:   PONV Risk Score and Plan: 1  Airway Management Planned: Nasal Cannula  Additional Equipment:   Intra-op Plan:   Post-operative Plan:   Informed Consent: I have reviewed the patients History and Physical, chart, labs and discussed the procedure including the risks, benefits and alternatives for the proposed anesthesia with the patient or authorized representative who has indicated his/her understanding and acceptance.       Plan Discussed with: CRNA  Anesthesia Plan Comments:        Anesthesia Quick Evaluation

## 2022-04-09 NOTE — H&P (Signed)
[facility]   Primary Care Physician:  Maryland Pink, MD Ophthalmologist: Dr. Benay Pillow  Pre-Procedure History & Physical: HPI:  Timothy Knapp is a 82 y.o. male here for cataract surgery.   Past Medical History:  Diagnosis Date   Actinic keratosis    Arthritis    Basal cell carcinoma    L chest, txted by Dr. Billy Fischer at [facility]   BPH (benign prostatic hypertrophy)    DDD (degenerative disc disease), lumbar 02/26/2020   Hyperlipidemia    Hypertension     Past Surgical History:  Procedure Laterality Date   CATARACT EXTRACTION W/PHACO Right 03/26/2022   Procedure: CATARACT EXTRACTION PHACO AND INTRAOCULAR LENS PLACEMENT (IOC) RIGHT DIABETIC CLAREON PANOPTIC TORIC LENS  4.96  00:30.3;  Surgeon: Eulogio Bear, MD;  Location: [place];  Service: Ophthalmology;  Laterality: Right;   COLONOSCOPY WITH PROPOFOL N/A 03/20/2017   Procedure: COLONOSCOPY WITH PROPOFOL;  Surgeon: Virgel Manifold, MD;  Location: [facility];  Service: Endoscopy;  Laterality: N/A;   COLONOSCOPY WITH PROPOFOL N/A 02/19/2020   Procedure: COLONOSCOPY WITH PROPOFOL;  Surgeon: Virgel Manifold, MD;  Location: [facility];  Service: Endoscopy;  Laterality: N/A;   JOINT REPLACEMENT     KNEE ARTHROPLASTY Right 12/10/2016   Procedure: COMPUTER ASSISTED TOTAL KNEE ARTHROPLASTY;  Surgeon: Dereck Leep, MD;  Location: [facility] OR[facility];  Service: Orthopedics;  Laterality: Right;   PROSTATE SURGERY N/A    SKIN CANCER EXCISION  11/09/2015    Prior to Admission medications   Medication Sig Start Date End Date Taking? Authorizing Provider  ALPHA LIPOIC ACID PO Take by mouth daily.   Yes [provider]  atorvastatin (LIPITOR) 20 MG tablet Take 20 mg by mouth daily.   Yes [provider]  B Complex Vitamins (B COMPLEX 1 PO) Take by mouth daily.   Yes [provider]  benazepril (LOTENSIN) 20 MG tablet TAKE 1 TABLET(20 MG) BY MOUTH DAILY 08/15/19  Yes Cannady, Jolene T, NP   Black Pepper-Turmeric (TURMERIC PLUS BLACK PEPPER EXT PO) Take by mouth in the morning and at bedtime.   Yes [provider]  dutasteride (AVODART) 0.5 MG capsule Take 0.5 mg by mouth daily.   Yes [provider]  Lycopene 10 MG CAPS Take 20 mg by mouth in the morning and at bedtime.   Yes [provider]  MELATONIN PO Take by mouth at bedtime.   Yes [provider]  Misc Natural Products (GLUCOS-CHONDROIT-MSM COMPLEX PO) Take 4 tablets by mouth daily.   Yes [provider]  Multiple Vitamins-Minerals (MULTIVITAMIN MEN PO) Take by mouth daily.   Yes [provider]  naproxen sodium (ALEVE) 220 MG tablet Take 220 mg by mouth 2 (two) times daily as needed.   Yes [provider]  Omega-3 Fatty Acids (OMEGA-3 PO) Take 788 mg by mouth daily.   Yes [provider]  zinc gluconate 50 MG tablet Take 50 mg by mouth daily.   Yes [provider]    Allergies as of 02/26/2022   (No Known Allergies)    Family History  Problem Relation Age of Onset   Heart disease Mother    Prostate cancer Neg Hx    Bladder Cancer Neg Hx    Kidney cancer Neg Hx     Social History   Socioeconomic History   Marital status: Married    Spouse name: Not on file   Number of children: Not on file   Years of  education: Not on file   Highest education level: Not on file  Occupational History   Not on file  Tobacco Use   Smoking status: Never   Smokeless tobacco: Never  Vaping Use   Vaping Use: Never used  Substance and Sexual Activity   Alcohol use: No    Alcohol/week: 0.0 standard drinks of alcohol   Drug use: No   Sexual activity: Not on file  Other Topics Concern   Not on file  Social History Narrative   Not on file   Social Determinants of Health   Financial Resource Strain: Low Risk  (12/05/2016)   Overall Financial Resource Strain (CARDIA)    Difficulty of Paying Living Expenses: Not hard at all  Food Insecurity:  Unknown (12/05/2016)   Hunger Vital Sign    Worried About Running Out of Food in the Last Year: Patient declined    [place] in the Last Year: Patient declined  Transportation Needs: Unknown (12/05/2016)   [place] - Hydrologist (Medical): Patient declined    Lack of Transportation (Non-Medical): Patient declined  Physical Activity: Unknown (12/05/2016)   Exercise Vital Sign    Days of Exercise per Week: Patient declined    Minutes of Exercise per Session: Patient declined  Stress: No Stress Concern Present (12/05/2016)   [employer] [place] of Stress : Not at all  Social Connections: Unknown (12/05/2016)   Social Connection and Isolation Panel [NHANES]    Frequency of Communication with Friends and Family: Patient declined    Frequency of Social Gatherings with Friends and Family: Patient declined    Attends Religious Services: Patient declined    Marine scientist or Organizations: Patient declined    Attends Archivist Meetings: Patient declined    Marital Status: Patient declined  Intimate Partner Violence: Unknown (12/05/2016)   Humiliation, Afraid, Rape, and Kick questionnaire    Fear of Current or Ex-Partner: Patient declined    Emotionally Abused: Patient declined    Physically Abused: Patient declined    Sexually Abused: Patient declined    Review of Systems: See HPI, otherwise negative ROS  Physical Exam: BP (!) 146/75   Pulse (!) 56   Temp 97.8 F (36.6 C) (Temporal)   Resp 18   Ht 5\' 8"  (1.727 m)   Wt 71.5 kg   SpO2 94%   BMI 23.97 kg/m  General:   Alert, cooperative in NAD Head:  Normocephalic and atraumatic. Respiratory:  Normal work of breathing. Cardiovascular:  RRR  Impression/Plan: Timothy Knapp is here for cataract surgery.  Risks, benefits, limitations, and alternatives regarding cataract surgery have been reviewed with the  patient.  Questions have been answered.  All parties agreeable.   Benay Pillow, MD  04/09/2022, 1:42 PM

## 2022-04-09 NOTE — Transfer of Care (Signed)
Immediate Anesthesia Transfer of Care Note  Patient: Timothy Knapp  Procedure(s) Performed: CATARACT EXTRACTION PHACO AND INTRAOCULAR LENS PLACEMENT (IOC) LEFT CLAREON PANOPTIC TORIC LENS  4.15  00:28.6 (Left: Eye)  Patient Location: PACU  Anesthesia Type: MAC  Level of Consciousness: awake, alert  and patient cooperative  Airway and Oxygen Therapy: Patient Spontanous Breathing and Patient connected to supplemental oxygen  Post-op Assessment: Post-op Vital signs reviewed, Patient's Cardiovascular Status Stable, Respiratory Function Stable, Patent Airway and No signs of Nausea or vomiting  Post-op Vital Signs: Reviewed and stable  Complications: No notable events documented.

## 2022-04-09 NOTE — Op Note (Signed)
OPERATIVE NOTE  MARKEVION HUXLEY [id number] 04/09/2022   PREOPERATIVE DIAGNOSIS:  Nuclear sclerotic cataract left eye.  H25.12   POSTOPERATIVE DIAGNOSIS:    Nuclear sclerotic cataract left eye.     PROCEDURE:  Phacoemusification with posterior chamber intraocular lens placement of the left eye   LENS:   Implant Name Type Inv. Item Serial No. Manufacturer Lot No. LRB No. Used Action  LENS CLAREON PANOPTIX TRC 15.5 - [id number]  LENS CLAREON PANOPTIX TRC 15.5 [id number] [employer]  Left 1 Implanted      Procedure(s): CATARACT EXTRACTION PHACO AND INTRAOCULAR LENS PLACEMENT (IOC) LEFT CLAREON PANOPTIC TORIC LENS  4.15  00:28.6 (Left)  [id number] +15.5   ULTRASOUND TIME: 0 minutes 28.6 seconds.  CDE 4.15   SURGEON:  Benay Pillow, MD, MPH   ANESTHESIA:  Topical with tetracaine drops augmented with 1% preservative-free intracameral lidocaine.  ESTIMATED BLOOD LOSS: <1 mL   COMPLICATIONS:  None.   DESCRIPTION OF PROCEDURE:  The patient was identified in the holding room and transported to the operating room and placed in the supine position under the operating microscope.  The left eye was identified as the operative eye and it was prepped and draped in the usual sterile ophthalmic fashion.  The verion system was registered without difficulty.   A 1.0 millimeter clear-corneal paracentesis was made at the 5:00 position. 0.5 ml of preservative-free 1% lidocaine with epinephrine was injected into the anterior chamber.  The anterior chamber was filled with viscoelastic.  A 2.4 millimeter keratome was used to make a near-clear corneal incision at the 2:00 position.  A curvilinear capsulorrhexis was made with a cystotome and capsulorrhexis forceps.  Balanced salt solution was used to hydrodissect and hydrodelineate the nucleus.   Phacoemulsification was then used in stop and chop fashion to remove the lens nucleus and epinucleus.  The remaining cortex was then removed using the irrigation and  aspiration handpiece. Viscoelastic was then placed into the capsular bag to distend it for lens placement.  A lens was then injected into the capsular bag.  The remaining viscoelastic was aspirated.  The lens was rotated with guidance from the [place] system.   Wounds were hydrated with balanced salt solution.  The anterior chamber was inflated to a physiologic pressure with balanced salt solution.  Intracameral vigamox 0.1 mL undiltued was injected into the eye and a drop placed onto the ocular surface.  No wound leaks were noted.  The patient was taken to the recovery room in stable condition without complications of anesthesia or surgery  Benay Pillow 04/09/2022, 2:15 PM

## 2022-04-10 ENCOUNTER — Encounter: Payer: Self-pay | Admitting: Ophthalmology

## 2022-04-10 NOTE — Anesthesia Postprocedure Evaluation (Signed)
Anesthesia Post Note  Patient: Timothy Knapp  Procedure(s) Performed: CATARACT EXTRACTION PHACO AND INTRAOCULAR LENS PLACEMENT (IOC) LEFT CLAREON PANOPTIC TORIC LENS  4.15  00:28.6 (Left: Eye)  Anesthesia Type: MAC Anesthetic complications: no   No notable events documented.   Last Vitals:  Vitals:   04/09/22 1416 04/09/22 1420  BP: 121/70 117/66  Pulse: (!) 58 (!) 51  Resp: 14 12  Temp: (!) 36.4 C (!) 36.4 C  SpO2: 93% 95%    Last Pain:  Vitals:   04/09/22 1420  TempSrc:   PainSc: 0-No pain                 Aldona Bryner C Babbie Dondlinger

## 2022-04-10 NOTE — Anesthesia Postprocedure Evaluation (Signed)
Anesthesia Post Note  Patient: Timothy Knapp  Procedure(s) Performed: CATARACT EXTRACTION PHACO AND INTRAOCULAR LENS PLACEMENT (IOC) LEFT CLAREON PANOPTIC TORIC LENS  4.15  00:28.6 (Left: Eye)  Patient location during evaluation: PACU Anesthesia Type: MAC Level of consciousness: awake and alert Pain management: satisfactory to patient Vital Signs Assessment: post-procedure vital signs reviewed and stable Anesthetic complications: no   No notable events documented.   Last Vitals:  Vitals:   04/09/22 1416 04/09/22 1420  BP: 121/70 117/66  Pulse: (!) 58 (!) 51  Resp: 14 12  Temp: (!) 36.4 C (!) 36.4 C  SpO2: 93% 95%    Last Pain:  Vitals:   04/09/22 1420  TempSrc:   PainSc: 0-No pain                 Vannak Montenegro C Winfrey Chillemi

## 2022-08-14 ENCOUNTER — Encounter: Payer: PPO | Admitting: Urology

## 2022-08-22 ENCOUNTER — Ambulatory Visit: Payer: Medicare Other | Admitting: Urology

## 2022-08-22 ENCOUNTER — Encounter: Payer: Self-pay | Admitting: Urology

## 2022-08-22 VITALS — BP 148/73 | HR 69 | Ht 67.0 in | Wt 160.0 lb

## 2022-08-22 DIAGNOSIS — N529 Male erectile dysfunction, unspecified: Secondary | ICD-10-CM | POA: Diagnosis not present

## 2022-08-22 DIAGNOSIS — R3912 Poor urinary stream: Secondary | ICD-10-CM

## 2022-08-22 DIAGNOSIS — R351 Nocturia: Secondary | ICD-10-CM

## 2022-08-22 DIAGNOSIS — Z125 Encounter for screening for malignant neoplasm of prostate: Secondary | ICD-10-CM | POA: Diagnosis not present

## 2022-08-22 DIAGNOSIS — N401 Enlarged prostate with lower urinary tract symptoms: Secondary | ICD-10-CM | POA: Diagnosis not present

## 2022-08-22 LAB — BLADDER SCAN AMB NON-IMAGING

## 2022-08-22 MED ORDER — TAMSULOSIN HCL 0.4 MG PO CAPS: 0.4000 mg | ORAL_CAPSULE | Freq: Every day | ORAL | 11 refills | Status: AC

## 2022-08-22 MED ORDER — DUTASTERIDE 0.5 MG PO CAPS
0.5000 mg | ORAL_CAPSULE | Freq: Every day | ORAL | 3 refills | Status: DC
Start: 2022-08-22 — End: 2023-08-12

## 2022-08-22 MED ORDER — TADALAFIL 10 MG PO TABS
10.0000 mg | ORAL_TABLET | Freq: Every day | ORAL | 11 refills | Status: DC
Start: 2022-08-22 — End: 2022-10-02

## 2022-08-22 NOTE — Progress Notes (Signed)
08/22/22 3:48 PM   Timothy Knapp 08-04-1940 [id number]  CC: BPH and urinary symptoms/nocturia, ED/peyronies disease, PSA screening  HPI: 82 year old healthy male who was followed by Dr. Lonna Cobb in 2019, as well as by Dr. Sheppard Penton over the last few years.  The records from Dr. Sheppard Penton are not available to me.  He has been on dutasteride for a few years which originally improved urinary symptoms, however has had worsening nocturia over the last year or 2 with nocturia 2-3 times at night.  He drinks primarily water during the day.  Recent urinalysis benign, PVR normal today at 40 mL.  He had a mildly elevated PSA of 5.3 in January 2020 which prompted a prostate MRI from Dr. Sheppard Penton.  Prostate MRI in March 2021 showed a 53 g prostate with no abnormalities, PSA density 0.1.  I suspect he was started on dutasteride at that time and then PSA decreased, most recently 2.45(corrected for finasteride 5), from June 2024.  He was diagnosed with peyronies disease by Dr. Lonna Cobb in 2019, but never pursued treatment.  He has not been sexually active or had any erections over the last few years.  He thinks it has been a while since he tried any medications for this.  He is unsure how aggressive he would like to be in terms of treatment or workup   PMH: Past Medical History:  Diagnosis Date   Actinic keratosis    Arthritis    Basal cell carcinoma    L chest, txted by Dr. Sallee Lange at [facility]   BPH (benign prostatic hypertrophy)    DDD (degenerative disc disease), lumbar 02/26/2020   Hyperlipidemia    Hypertension     Surgical History: Past Surgical History:  Procedure Laterality Date   CATARACT EXTRACTION W/PHACO Right 03/26/2022   Procedure: CATARACT EXTRACTION PHACO AND INTRAOCULAR LENS PLACEMENT (IOC) RIGHT DIABETIC CLAREON PANOPTIC TORIC LENS  4.96  00:30.3;  Surgeon: Nevada Crane, MD;  Location: [facility];  Service: Ophthalmology;  Laterality: Right;   CATARACT EXTRACTION W/PHACO Left 04/09/2022    Procedure: CATARACT EXTRACTION PHACO AND INTRAOCULAR LENS PLACEMENT (IOC) LEFT CLAREON PANOPTIC TORIC LENS  4.15  00:28.6;  Surgeon: Nevada Crane, MD;  Location: [facility];  Service: Ophthalmology;  Laterality: Left;   COLONOSCOPY WITH PROPOFOL N/A 03/20/2017   Procedure: COLONOSCOPY WITH PROPOFOL;  Surgeon: Pasty Spillers, MD;  Location: [facility];  Service: Endoscopy;  Laterality: N/A;   COLONOSCOPY WITH PROPOFOL N/A 02/19/2020   Procedure: COLONOSCOPY WITH PROPOFOL;  Surgeon: Pasty Spillers, MD;  Location: [facility];  Service: Endoscopy;  Laterality: N/A;   JOINT REPLACEMENT     KNEE ARTHROPLASTY Right 12/10/2016   Procedure: COMPUTER ASSISTED TOTAL KNEE ARTHROPLASTY;  Surgeon: Donato Heinz, MD;  Location: [facility];  Service: Orthopedics;  Laterality: Right;   PROSTATE SURGERY N/A    SKIN CANCER EXCISION  11/09/2015   Family History: Family History  Problem Relation Age of Onset   Heart disease Mother    Prostate cancer Neg Hx    Bladder Cancer Neg Hx    Kidney cancer Neg Hx     Social History:  reports that he has never smoked. He has never used smokeless tobacco. He reports that he does not drink alcohol and does not use drugs.  Physical Exam: BP (!) 148/73 (BP Location: Left Arm, Patient Position: Sitting, Cuff Size: Normal)   Pulse 69   Ht 5\' 7"  (1.702 m)   Wt 160 lb (72.6  kg)   BMI 25.06 kg/m    Constitutional:  Alert and oriented, No acute distress. Cardiovascular: No clubbing, cyanosis, or edema. Respiratory: Normal respiratory effort, no increased work of breathing. GI: Abdomen is soft, nontender, nondistended, no abdominal masses   Laboratory Data: Reviewed, see HPI  Pertinent Imaging: I have personally viewed and interpreted the prostate MRI from March 2021 showing a 53 g gland with no worrisome lesions, PSA density 0.1.  Assessment & Plan:   82 year old male with BPH and urinary symptoms with chief complaint of nocturia  2-3 times overnight despite dutasteride, normal urinalysis and PVR, history of elevated PSA with normal prostate MRI, and ED.  Reassurance was provided regarding his downtrending PSA and normal prostate MRI, and reassuring PSA density.  Would not recommend further PSA screening based on his age per the guideline recommendations.  Regarding his ED, he was interested in a trial of Cialis, and I recommended starting with 10 mg daily with 10 mg boost dose.  He is unsure if he would like further workup with testosterone and will discuss with his wife.  In terms of his BPH and urinary symptoms, especially nocturia, behavioral strategies were discussed including minimizing fluids prior to bedtime.  I also recommended a trial of Flomax to see if we can improve his nocturia, and risks and benefits were discussed.  Trial of Flomax in addition to dutasteride Can discontinue PSA screening per guideline recommendations Trial of Cialis for ED RTC 6 weeks PVR and symptom check   Legrand Rams, MD 08/22/2022  [facility] [address], [address] [place], [place] [address] [phone]

## 2022-09-26 ENCOUNTER — Ambulatory Visit: Payer: Medicare Other | Admitting: Dermatology

## 2022-09-26 DIAGNOSIS — L820 Inflamed seborrheic keratosis: Secondary | ICD-10-CM

## 2022-09-26 DIAGNOSIS — D229 Melanocytic nevi, unspecified: Secondary | ICD-10-CM

## 2022-09-26 DIAGNOSIS — D1801 Hemangioma of skin and subcutaneous tissue: Secondary | ICD-10-CM

## 2022-09-26 DIAGNOSIS — L578 Other skin changes due to chronic exposure to nonionizing radiation: Secondary | ICD-10-CM | POA: Diagnosis not present

## 2022-09-26 DIAGNOSIS — L821 Other seborrheic keratosis: Secondary | ICD-10-CM

## 2022-09-26 DIAGNOSIS — L814 Other melanin hyperpigmentation: Secondary | ICD-10-CM

## 2022-09-26 DIAGNOSIS — Z1283 Encounter for screening for malignant neoplasm of skin: Secondary | ICD-10-CM | POA: Diagnosis not present

## 2022-09-26 DIAGNOSIS — Z85828 Personal history of other malignant neoplasm of skin: Secondary | ICD-10-CM

## 2022-09-26 DIAGNOSIS — L570 Actinic keratosis: Secondary | ICD-10-CM | POA: Diagnosis not present

## 2022-09-26 DIAGNOSIS — W908XXA Exposure to other nonionizing radiation, initial encounter: Secondary | ICD-10-CM

## 2022-09-26 NOTE — Progress Notes (Unsigned)
Follow-Up Visit   Subjective  Timothy Knapp is a 82 y.o. male who presents for the following: Skin Cancer Screening and Full Body Skin Exam, hx of BCC  The patient presents for Total-Body Skin Exam (TBSE) for skin cancer screening and mole check. The patient has spots, moles and lesions to be evaluated, some may be new or changing and the patient may have concern these could be cancer.    The following portions of the chart were reviewed this encounter and updated as appropriate: medications, allergies, medical history  Review of Systems:  No other skin or systemic complaints except as noted in HPI or Assessment and Plan.  Objective  Well appearing patient in no apparent distress; mood and affect are within normal limits.  A full examination was performed including scalp, head, eyes, ears, nose, lips, neck, chest, axillae, abdomen, back, buttocks, bilateral upper extremities, bilateral lower extremities, hands, feet, fingers, toes, fingernails, and toenails. All findings within normal limits unless otherwise noted below.   Relevant physical exam findings are noted in the Assessment and Plan.  right face x 15, left cheek x 1, back x 15 (31) Stuck-on, waxy, tan-brown papule or plaque --Discussed benign etiology and prognosis.   left temple x 2 (2) Erythematous thin papules/macules with gritty scale.    Assessment & Plan   SKIN CANCER SCREENING PERFORMED TODAY.  ACTINIC DAMAGE - Chronic condition, secondary to cumulative UV/sun exposure - diffuse scaly erythematous macules with underlying dyspigmentation - Recommend daily broad spectrum sunscreen SPF 30+ to sun-exposed areas, reapply every 2 hours as needed.  - Staying in the shade or wearing long sleeves, sun glasses (UVA+UVB protection) and wide brim hats (4-inch brim around the entire circumference of the hat) are also recommended for sun protection.  - Call for new or changing lesions.  LENTIGINES, SEBORRHEIC KERATOSES,  HEMANGIOMAS - Benign normal skin lesions - Benign-appearing - Call for any changes  MELANOCYTIC NEVI - Tan-brown and/or pink-flesh-colored symmetric macules and papules - Benign appearing on exam today - Observation - Call clinic for new or changing moles - Recommend daily use of broad spectrum spf 30+ sunscreen to sun-exposed areas.    History of Basal Cell Carcinoma of the Skin Left chest  - No evidence of recurrence today - Recommend regular full body skin exams - Recommend daily broad spectrum sunscreen SPF 30+ to sun-exposed areas, reapply every 2 hours as needed.  - Call if any new or changing lesions are noted between office visits     Inflamed seborrheic keratosis (31) right face x 15, left cheek x 1, back x 15  Symptomatic, irritating, patient would like treated.   Destruction of lesion - right face x 15, left cheek x 1, back x 15 (31) Complexity: simple   Destruction method: cryotherapy   Informed consent: discussed and consent obtained   Timeout:  patient name, date of birth, surgical site, and procedure verified Lesion destroyed using liquid nitrogen: Yes   Region frozen until ice ball extended beyond lesion: Yes   Outcome: patient tolerated procedure well with no complications   Post-procedure details: wound care instructions given    AK (actinic keratosis) (2) left temple x 2  Actinic keratoses are precancerous spots that appear secondary to cumulative UV radiation exposure/sun exposure over time. They are chronic with expected duration over 1 year. A portion of actinic keratoses will progress to squamous cell carcinoma of the skin. It is not possible to reliably predict which spots will progress to skin  cancer and so treatment is recommended to prevent development of skin cancer.  Recommend daily broad spectrum sunscreen SPF 30+ to sun-exposed areas, reapply every 2 hours as needed.  Recommend staying in the shade or wearing long sleeves, sun glasses (UVA+UVB  protection) and wide brim hats (4-inch brim around the entire circumference of the hat). Call for new or changing lesions.   Destruction of lesion - left temple x 2 (2) Complexity: simple   Destruction method: cryotherapy   Informed consent: discussed and consent obtained   Timeout:  patient name, date of birth, surgical site, and procedure verified Lesion destroyed using liquid nitrogen: Yes   Region frozen until ice ball extended beyond lesion: Yes   Outcome: patient tolerated procedure well with no complications   Post-procedure details: wound care instructions given     Return in about 4 months (around 01/26/2023) for hx of AKs, ISKs .  IAngelique Holm, CMA, am acting as scribe for Armida Sans, MD .   Documentation: I have reviewed the above documentation for accuracy and completeness, and I agree with the above.  Armida Sans, MD

## 2022-09-26 NOTE — Patient Instructions (Addendum)

## 2022-09-27 ENCOUNTER — Encounter: Payer: Self-pay | Admitting: Dermatology

## 2022-10-02 ENCOUNTER — Encounter: Payer: Self-pay | Admitting: Urology

## 2022-10-02 ENCOUNTER — Ambulatory Visit: Payer: Medicare Other | Admitting: Urology

## 2022-10-02 VITALS — BP 140/69 | HR 76 | Ht 67.0 in | Wt 160.0 lb

## 2022-10-02 DIAGNOSIS — N529 Male erectile dysfunction, unspecified: Secondary | ICD-10-CM | POA: Diagnosis not present

## 2022-10-02 DIAGNOSIS — Z125 Encounter for screening for malignant neoplasm of prostate: Secondary | ICD-10-CM

## 2022-10-02 DIAGNOSIS — N401 Enlarged prostate with lower urinary tract symptoms: Secondary | ICD-10-CM

## 2022-10-02 DIAGNOSIS — R3912 Poor urinary stream: Secondary | ICD-10-CM

## 2022-10-02 LAB — BLADDER SCAN AMB NON-IMAGING

## 2022-10-02 NOTE — Patient Instructions (Signed)

## 2022-10-02 NOTE — Progress Notes (Signed)
10/02/2022 1:39 PM   Timothy Knapp 08-May-1940 [id number]  Reason for visit: Follow up BPH, nocturia, ED, PSA screening  HPI: 82 year old male previously followed by Dr. Lonna Cobb as well as Dr. Sheppard Penton.  I originally met him in August 2024, and his primary complaint at that time was nocturia 2-3 times overnight.  We reviewed behavioral strategies and added Flomax to his dutasteride, and he has had significant improvement since that time.  Nocturia has improved to typically once overnight.  PVR today is normal at 50 mL.  He would like to continue the Flomax.  We also tried Cialis for ED, he reports no improvement on the Cialis.  We discussed other options like checking a morning testosterone or penile injections, he is not interested in further treatments at this time.  He has a history of mildly elevated PSA of 5.3 in January 2020 which prompted a prostate MRI from Dr. Sheppard Penton, this showed a 53 g prostate with no abnormalities, and PSA decreased appropriately on dutasteride to 2.45 from 5.  We reviewed the guidelines that do not recommend routine PSA screening in men over age 69.  Continue Flomax and finasteride RTC 1 year PVR  Sondra Come, MD  [facility] [address], [address] [place], [place] [address] [phone]

## 2023-01-10 ENCOUNTER — Emergency Department
Admission: EM | Admit: 2023-01-10 | Discharge: 2023-01-10 | Disposition: A | Discharge status: Home / Self Care | Payer: Medicare Other | Attending: Emergency Medicine | Admitting: Emergency Medicine

## 2023-01-10 ENCOUNTER — Emergency Department: Payer: Medicare Other

## 2023-01-10 ENCOUNTER — Other Ambulatory Visit: Payer: Self-pay

## 2023-01-10 ENCOUNTER — Encounter: Payer: Self-pay | Admitting: Emergency Medicine

## 2023-01-10 DIAGNOSIS — I10 Essential (primary) hypertension: Secondary | ICD-10-CM | POA: Diagnosis not present

## 2023-01-10 DIAGNOSIS — S8992XA Unspecified injury of left lower leg, initial encounter: Secondary | ICD-10-CM | POA: Diagnosis present

## 2023-01-10 DIAGNOSIS — W268XXA Contact with other sharp object(s), not elsewhere classified, initial encounter: Secondary | ICD-10-CM | POA: Insufficient documentation

## 2023-01-10 DIAGNOSIS — S8265XA Nondisplaced fracture of lateral malleolus of left fibula, initial encounter for closed fracture: Secondary | ICD-10-CM | POA: Diagnosis not present

## 2023-01-10 NOTE — ED Triage Notes (Signed)
 Patient to ED via POV for left ankle pain. Pt states he dropped a wooden plank on foot. Difficult to ambulate due to pain.

## 2023-01-10 NOTE — ED Provider Notes (Signed)
 [facility] Provider Note    Event Date/Time   First MD Initiated Contact with Patient 01/10/23 1011     (approximate)   History   Ankle Pain   HPI  Timothy Knapp is a 83 y.o. male with history of degenerative disc disease, hypertension, hyperlipidemia, and as listed in EMR presents to the emergency department for treatment and evaluation after  a wooden plank fell against lateral aspect of left ankle yesterday evening. Today it is swollen and painful to attempt to bear weight.       Physical Exam   Triage Vital Signs: ED Triage Vitals  Encounter Vitals Group     BP 01/10/23 0939 106/67     Systolic BP Percentile --      Diastolic BP Percentile --      Pulse Rate 01/10/23 0940 65     Resp 01/10/23 0939 18     Temp 01/10/23 0939 98.2 F (36.8 C)     Temp Source 01/10/23 0939 Oral     SpO2 01/10/23 0940 94 %     Weight 01/10/23 0939 155 lb (70.3 kg)     Height 01/10/23 0939 5' 8 (1.727 m)     Head Circumference --      Peak Flow --      Pain Score 01/10/23 0938 7     Pain Loc --      Pain Education --      Exclude from Growth Chart --     Most recent vital signs: Vitals:   01/10/23 0939 01/10/23 0940  BP: 106/67   Pulse:  65  Resp: 18   Temp: 98.2 F (36.8 C)   SpO2:  94%    General: Awake, no distress.  CV:  Good peripheral perfusion.  Resp:  Normal effort.  Abd:  No distention.  Other:  Swelling over left lateral malleolus. Ankle compartments are soft. FROM of toes. No open wounds.   ED Results / Procedures / Treatments   Labs (all labs ordered are listed, but only abnormal results are displayed) Labs Reviewed - No data to display   EKG  Not indicated.   RADIOLOGY  Image and radiology report reviewed and interpreted by me. Radiology report consistent with the same.  On my read, nondisplaced left distal fibula fracture.  **Radiology report pending upon discharge**  PROCEDURES:  Critical Care performed:  No  Procedures   MEDICATIONS ORDERED IN ED:  Medications - No data to display   IMPRESSION / MDM / ASSESSMENT AND PLAN / ED COURSE   I have reviewed the triage note.  Differential diagnosis includes, but is not limited to, contusion, distal tibia/fibula fracture  Patient's presentation is most consistent with acute illness / injury with system symptoms.  83 year old male presenting to the emergency department for treatment and evaluation of left ankle pain and swelling after a wooden plank fell against it last evening.  See HPI for further details.  On exam, he is tender over the lateral malleolus.  Compartments are soft.  Image of the left ankle is pending.  Radiology report has been unavailable for quite some time. On my read, I do see a nondisplaced distal fibula fracture. Plan will be to place him in a CAM walking boot and provide follow up information for podiatry. Patient and wife are aware that the official radiology reading is not uploaded as of discharge and were instructed to check MyChart later today and call back to the ER for  any questions. He was instructed to rest, ice, and elevate the foot/ankle throughout the day and to wear the boot any time he will be up moving around and at night.       FINAL CLINICAL IMPRESSION(S) / ED DIAGNOSES   Final diagnoses:  Closed nondisplaced fracture of lateral malleolus of left fibula, initial encounter     Rx / DC Orders   ED Discharge Orders     None        Note:  This document was prepared using Dragon voice recognition software and may include unintentional dictation errors.   Herlinda Kirk NOVAK, FNP 01/11/23 9076    Levander Slate, MD 01/11/23 1431

## 2023-02-19 ENCOUNTER — Ambulatory Visit: Payer: Medicare Other | Admitting: Dermatology

## 2023-02-19 DIAGNOSIS — L729 Follicular cyst of the skin and subcutaneous tissue, unspecified: Secondary | ICD-10-CM

## 2023-02-19 DIAGNOSIS — W908XXA Exposure to other nonionizing radiation, initial encounter: Secondary | ICD-10-CM

## 2023-02-19 DIAGNOSIS — L821 Other seborrheic keratosis: Secondary | ICD-10-CM

## 2023-02-19 DIAGNOSIS — L820 Inflamed seborrheic keratosis: Secondary | ICD-10-CM | POA: Diagnosis not present

## 2023-02-19 DIAGNOSIS — L814 Other melanin hyperpigmentation: Secondary | ICD-10-CM

## 2023-02-19 DIAGNOSIS — Z85828 Personal history of other malignant neoplasm of skin: Secondary | ICD-10-CM

## 2023-02-19 DIAGNOSIS — L738 Other specified follicular disorders: Secondary | ICD-10-CM

## 2023-02-19 DIAGNOSIS — L570 Actinic keratosis: Secondary | ICD-10-CM | POA: Diagnosis not present

## 2023-02-19 DIAGNOSIS — I781 Nevus, non-neoplastic: Secondary | ICD-10-CM

## 2023-02-19 DIAGNOSIS — Z1283 Encounter for screening for malignant neoplasm of skin: Secondary | ICD-10-CM | POA: Diagnosis not present

## 2023-02-19 DIAGNOSIS — D1801 Hemangioma of skin and subcutaneous tissue: Secondary | ICD-10-CM

## 2023-02-19 DIAGNOSIS — D229 Melanocytic nevi, unspecified: Secondary | ICD-10-CM

## 2023-02-19 DIAGNOSIS — L578 Other skin changes due to chronic exposure to nonionizing radiation: Secondary | ICD-10-CM | POA: Diagnosis not present

## 2023-02-19 DIAGNOSIS — L720 Epidermal cyst: Secondary | ICD-10-CM

## 2023-02-19 NOTE — Progress Notes (Signed)
Follow-Up Visit   Subjective  Timothy Knapp is a 83 y.o. male who presents for the following: Skin Cancer Screening and Upper Body Skin Exam hx of BCCs, Aks, check spot glabella, R infraocular, no symptoms  The patient presents for Upper Body Skin Exam (UBSE) for skin cancer screening and mole check. The patient has spots, moles and lesions to be evaluated, some may be new or changing and the patient may have concern these could be cancer.    The following portions of the chart were reviewed this encounter and updated as appropriate: medications, allergies, medical history  Review of Systems:  No other skin or systemic complaints except as noted in HPI or Assessment and Plan.  Objective  Well appearing patient in no apparent distress; mood and affect are within normal limits.  All skin waist up examined. Relevant physical exam findings are noted in the Assessment and Plan.  L mandible x 1, R temple x 1, L hand dorsum x 1 (3) Stuck on waxy paps with erythema L nasal tip x 1, R vertex scalp x 1 (2) Pink scaly macules  Assessment & Plan   INFLAMED SEBORRHEIC KERATOSIS (3) L mandible x 1, R temple x 1, L hand dorsum x 1 (3) Symptomatic, irritating, patient would like treated. Destruction of lesion - L mandible x 1, R temple x 1, L hand dorsum x 1 (3)  Destruction method: cryotherapy   Informed consent: discussed and consent obtained   Lesion destroyed using liquid nitrogen: Yes   Region frozen until ice ball extended beyond lesion: Yes   Outcome: patient tolerated procedure well with no complications   Post-procedure details: wound care instructions given   Additional details:  Prior to procedure, discussed risks of blister formation, small wound, skin dyspigmentation, or rare scar following cryotherapy. Recommend Vaseline ointment to treated areas while healing.  AK (ACTINIC KERATOSIS) (2) L nasal tip x 1, R vertex scalp x 1 (2) Actinic keratoses are precancerous spots that  appear secondary to cumulative UV radiation exposure/sun exposure over time. They are chronic with expected duration over 1 year. A portion of actinic keratoses will progress to squamous cell carcinoma of the skin. It is not possible to reliably predict which spots will progress to skin cancer and so treatment is recommended to prevent development of skin cancer.  Recommend daily broad spectrum sunscreen SPF 30+ to sun-exposed areas, reapply every 2 hours as needed.  Recommend staying in the shade or wearing long sleeves, sun glasses (UVA+UVB protection) and wide brim hats (4-inch brim around the entire circumference of the hat). Call for new or changing lesions. Destruction of lesion - L nasal tip x 1, R vertex scalp x 1 (2)  Destruction method: cryotherapy   Informed consent: discussed and consent obtained   Lesion destroyed using liquid nitrogen: Yes   Region frozen until ice ball extended beyond lesion: Yes   Outcome: patient tolerated procedure well with no complications   Post-procedure details: wound care instructions given   Additional details:  Prior to procedure, discussed risks of blister formation, small wound, skin dyspigmentation, or rare scar following cryotherapy. Recommend Vaseline ointment to treated areas while healing.   Skin cancer screening performed today.  Actinic Damage - Chronic condition, secondary to cumulative UV/sun exposure - diffuse scaly erythematous macules with underlying dyspigmentation - Recommend daily broad spectrum sunscreen SPF 30+ to sun-exposed areas, reapply every 2 hours as needed.  - Staying in the shade or wearing long sleeves, sun glasses (UVA+UVB  protection) and wide brim hats (4-inch brim around the entire circumference of the hat) are also recommended for sun protection.  - Call for new or changing lesions.  Lentigines, Seborrheic Keratoses, Hemangiomas - Benign normal skin lesions - Benign-appearing - Call for any changes  Melanocytic  Nevi - Tan-brown and/or pink-flesh-colored symmetric macules and papules - Benign appearing on exam today - Observation - Call clinic for new or changing moles - Recommend daily use of broad spectrum spf 30+ sunscreen to sun-exposed areas.   HISTORY OF BASAL CELL CARCINOMA OF THE SKIN - No evidence of recurrence today- L chest - Recommend regular full body skin exams - Recommend daily broad spectrum sunscreen SPF 30+ to sun-exposed areas, reapply every 2 hours as needed.  - Call if any new or changing lesions are noted between office visits    EPIDERMAL INCLUSION CYST glabella Exam: firm flesh papule at glabella  Benign-appearing. Exam most consistent with an epidermal inclusion cyst. Discussed that a cyst is a benign growth that can grow over time and sometimes get irritated or inflamed. Recommend observation if it is not bothersome. Discussed option of surgical excision to remove it if it is growing, symptomatic, or other changes noted. Please call for new or changing lesions so they can be evaluated.  Sebaceous Hyperplasia - glabella  - Small yellow papules with a central dell - Benign-appearing - Observe. Call for changes.   SEBACEOUS HYPERPLASIA vs SEBORRHEIC KERATOSIS R malar cheek Exam: 4.88mm pink yellow pap R malar cheek, slightly waxy  Treatment Plan: Benign-appearing.  Observation.  Call clinic for new or changing lesions.  Recommend daily use of broad spectrum spf 30+ sunscreen to sun-exposed areas.    TELANGIECTASIA nose Exam: dilated blood vessel(s)  Treatment Plan: Benign appearing on exam Call for changes     Return in about 6 months (around 08/19/2023) for UBSE, Hx of AKs, Hx of BCC.  I, Ardis Rowan, RMA, am acting as scribe for Willeen Niece, MD .   Documentation: I have reviewed the above documentation for accuracy and completeness, and I agree with the above.  Willeen Niece, MD

## 2023-02-19 NOTE — Patient Instructions (Addendum)

## 2023-02-26 ENCOUNTER — Ambulatory Visit: Payer: Medicare Other | Admitting: Dermatology

## 2023-07-24 ENCOUNTER — Encounter: Payer: Self-pay | Admitting: Urology

## 2023-08-10 ENCOUNTER — Other Ambulatory Visit: Payer: Self-pay | Admitting: Urology

## 2023-08-10 DIAGNOSIS — N401 Enlarged prostate with lower urinary tract symptoms: Secondary | ICD-10-CM

## 2023-08-20 ENCOUNTER — Ambulatory Visit: Payer: Medicare Other | Admitting: Dermatology

## 2023-08-20 DIAGNOSIS — L578 Other skin changes due to chronic exposure to nonionizing radiation: Secondary | ICD-10-CM

## 2023-08-20 DIAGNOSIS — R238 Other skin changes: Secondary | ICD-10-CM | POA: Diagnosis not present

## 2023-08-20 DIAGNOSIS — W908XXA Exposure to other nonionizing radiation, initial encounter: Secondary | ICD-10-CM | POA: Diagnosis not present

## 2023-08-20 DIAGNOSIS — D1801 Hemangioma of skin and subcutaneous tissue: Secondary | ICD-10-CM

## 2023-08-20 DIAGNOSIS — Z1283 Encounter for screening for malignant neoplasm of skin: Secondary | ICD-10-CM

## 2023-08-20 DIAGNOSIS — L820 Inflamed seborrheic keratosis: Secondary | ICD-10-CM | POA: Diagnosis not present

## 2023-08-20 DIAGNOSIS — Z872 Personal history of diseases of the skin and subcutaneous tissue: Secondary | ICD-10-CM

## 2023-08-20 DIAGNOSIS — Z85828 Personal history of other malignant neoplasm of skin: Secondary | ICD-10-CM

## 2023-08-20 DIAGNOSIS — D229 Melanocytic nevi, unspecified: Secondary | ICD-10-CM

## 2023-08-20 NOTE — Progress Notes (Signed)
 Follow-Up Visit   Subjective  Timothy Knapp is a 83 y.o. male who presents for the following: Skin Cancer Screening and Upper Body Skin Exam  The patient presents for Upper Body Skin Exam (UBSE) for skin cancer screening and mole check. The patient has spots, moles and lesions to be evaluated, some may be new or changing. History of BCC of the left chest. He has itchy/irritated spots on the face, arm, and lower back.    The following portions of the chart were reviewed this encounter and updated as appropriate: medications, allergies, medical history  Review of Systems:  No other skin or systemic complaints except as noted in HPI or Assessment and Plan.  Objective  Well appearing patient in no apparent distress; mood and affect are within normal limits.  All skin waist up examined. Relevant physical exam findings are noted in the Assessment and Plan.  R zygoma x 1, R temple x 2, R malar cheek x 3, L lat cheek x 1, R lat cheek x 1, R lower back x 5, L hand dorsum at wrist x 6 (19) Erythematous stuck-on, waxy papule   Assessment & Plan   INFLAMED SEBORRHEIC KERATOSIS (19) R zygoma x 1, R temple x 2, R malar cheek x 3, L lat cheek x 1, R lat cheek x 1, R lower back x 5, L hand dorsum at wrist x 6 (19) Symptomatic, irritating, patient would like treated. Irritated with shaving on face. Destruction of lesion - R zygoma x 1, R temple x 2, R malar cheek x 3, L lat cheek x 1, R lat cheek x 1, R lower back x 5, L hand dorsum at wrist x 6 (19)  Destruction method: cryotherapy   Informed consent: discussed and consent obtained   Lesion destroyed using liquid nitrogen: Yes   Region frozen until ice ball extended beyond lesion: Yes   Outcome: patient tolerated procedure well with no complications   Post-procedure details: wound care instructions given   Additional details:  Prior to procedure, discussed risks of blister formation, small wound, skin dyspigmentation, or rare scar following  cryotherapy. Recommend Vaseline ointment to treated areas while healing.   Skin cancer screening performed today.  Actinic Damage - Chronic condition, secondary to cumulative UV/sun exposure - diffuse scaly erythematous macules with underlying dyspigmentation - Recommend daily broad spectrum sunscreen SPF 30+ to sun-exposed areas, reapply every 2 hours as needed.  - Staying in the shade or wearing long sleeves, sun glasses (UVA+UVB protection) and wide brim hats (4-inch brim around the entire circumference of the hat) are also recommended for sun protection.  - Call for new or changing lesions.  Lentigines, Seborrheic Keratoses, Hemangiomas - Benign normal skin lesions - Benign-appearing - Call for any changes  Melanocytic Nevi - Tan-brown and/or pink-flesh-colored symmetric macules and papules - Benign appearing on exam today - Observation - Call clinic for new or changing moles - Recommend daily use of broad spectrum spf 30+ sunscreen to sun-exposed areas.   HISTORY OF BASAL CELL CARCINOMA OF THE SKIN - No evidence of recurrence today at left chest - Recommend regular full body skin exams - Recommend daily broad spectrum sunscreen SPF 30+ to sun-exposed areas, reapply every 2 hours as needed.  - Call if any new or changing lesions are noted between office visits  [place] Exam: purple macule at left ear helix, blanches  Treatment Plan: Benign-appearing. Observe   HISTORY OF PRECANCEROUS ACTINIC KERATOSIS - site(s) of PreCancerous Actinic Keratosis  clear today. - these may recur and new lesions may form requiring treatment to prevent transformation into skin cancer - observe for new or changing spots and contact [facility] for appointment if occur - photoprotection with sun protective clothing; sunglasses and broad spectrum sunscreen with SPF of at least 30 + and frequent self skin exams recommended - yearly exams by a dermatologist recommended for persons with  history of PreCancerous Actinic Keratoses   Return in about 6 months (around 02/20/2024) for AKs.  IAndrea Kerns, CMA, am acting as scribe for Rexene Rattler, MD .   Documentation: I have reviewed the above documentation for accuracy and completeness, and I agree with the above.  Rexene Rattler, MD

## 2023-08-20 NOTE — Patient Instructions (Addendum)

## 2023-10-01 ENCOUNTER — Ambulatory Visit (INDEPENDENT_AMBULATORY_CARE_PROVIDER_SITE_OTHER): Admitting: Urology

## 2023-10-01 VITALS — BP 123/68 | HR 64 | Wt 150.0 lb

## 2023-10-01 DIAGNOSIS — Z125 Encounter for screening for malignant neoplasm of prostate: Secondary | ICD-10-CM

## 2023-10-01 DIAGNOSIS — R3912 Poor urinary stream: Secondary | ICD-10-CM

## 2023-10-01 DIAGNOSIS — R351 Nocturia: Secondary | ICD-10-CM | POA: Diagnosis not present

## 2023-10-01 DIAGNOSIS — N529 Male erectile dysfunction, unspecified: Secondary | ICD-10-CM

## 2023-10-01 DIAGNOSIS — N401 Enlarged prostate with lower urinary tract symptoms: Secondary | ICD-10-CM | POA: Diagnosis not present

## 2023-10-01 LAB — BLADDER SCAN AMB NON-IMAGING

## 2023-10-01 MED ORDER — DUTASTERIDE 0.5 MG PO CAPS: 0.5000 mg | ORAL_CAPSULE | Freq: Every day | ORAL | 3 refills | Status: AC

## 2023-10-01 NOTE — Patient Instructions (Signed)

## 2023-10-01 NOTE — Progress Notes (Signed)
   10/01/2023 12:58 PM   Timothy Knapp Jan 11, 1940 [id number]  Reason for visit: Follow up BPH, nocturia, PSA screening, ED  History: Previously followed by Dr. Gala and Dr. Twylla, initial visit with me was August 2024 Has been on dutasteride  long-term Flomax  was added August 2024 which initially improved his nocturia to 1 time overnight ED refractory to Cialis , not interested in other treatment options History of mildly elevated PSA of 5.10 January 2018, prostate MRI by Dr. Gala showed 53 g prostate with no abnormalities, PSA decreased appropriately on dutasteride   Physical Exam: BP 123/68 (BP Location: Left Arm, Patient Position: Sitting, Cuff Size: Normal)   Pulse 64   Wt 150 lb (68 kg)   SpO2 96%   BMI 22.81 kg/m    Imaging/labs: PSA stable at 2.3, corrected for finasteride 4.6 Recent urinalysis with pyuria, 3 RBC, not sent for culture  Today: He has increased fluid intake with resultant increased nocturia 3-4 times overnight He stopped Flomax  and feels no significant change in urinary symptoms, would like to stay off that medication Declines other treatment options for ED Denies dysuria, gross hematuria  Plan:   BPH/nocturia: Behavioral strategies discussed, continue dutasteride , if worsens could consider retrial of an alpha-blocker.  Dutasteride  refills PSA screening: Reassurance provided regarding normal PSA, benign prostate MRI, no further screening needed per the guideline recommendations Pyuria/microscopic hematuria: He requested to repeat urinalysis prior to considering further workup, will call with UA and culture.  Would recommend considering CT urogram and cystoscopy if persistent microscopic hematuria Continue yearly follow-up for PVR   Redell JAYSON Burnet, MD  [facility] [address], [address] [place], [place] [address] [phone]

## 2023-10-02 ENCOUNTER — Ambulatory Visit: Payer: Self-pay | Admitting: Urology

## 2023-10-08 ENCOUNTER — Other Ambulatory Visit
Admission: RE | Admit: 2023-10-08 | Discharge: 2023-10-08 | Disposition: A | Discharge status: Home / Self Care | Attending: Urology | Admitting: Urology

## 2023-10-08 DIAGNOSIS — N401 Enlarged prostate with lower urinary tract symptoms: Secondary | ICD-10-CM | POA: Insufficient documentation

## 2023-10-08 DIAGNOSIS — R3912 Poor urinary stream: Secondary | ICD-10-CM | POA: Insufficient documentation

## 2023-10-08 LAB — URINALYSIS, COMPLETE (UACMP) WITH MICROSCOPIC
Bilirubin Urine: NEGATIVE
Glucose, UA: NEGATIVE mg/dL
Hgb urine dipstick: NEGATIVE
Ketones, ur: NEGATIVE mg/dL
Nitrite: NEGATIVE
Protein, ur: NEGATIVE mg/dL
RBC / HPF: NONE SEEN RBC/hpf (ref 0–5)
Specific Gravity, Urine: 1.015 (ref 1.005–1.030)
Squamous Epithelial / HPF: NONE SEEN /HPF (ref 0–5)
pH: 6 (ref 5.0–8.0)

## 2023-10-09 ENCOUNTER — Ambulatory Visit: Payer: Self-pay | Admitting: Urology

## 2023-10-09 LAB — URINE CULTURE: Culture: NO GROWTH

## 2024-02-25 ENCOUNTER — Ambulatory Visit: Admitting: Dermatology

## 2024-10-01 ENCOUNTER — Ambulatory Visit: Admitting: Urology
